# Patient Record
Sex: Male | Born: 1940 | Race: Black or African American | Hispanic: No | Marital: Married | Smoking: Former smoker
Health system: Southern US, Community
[De-identification: ages and names within clinical notes are randomized; demographics above are authoritative.]

## PROBLEM LIST (undated history)

## (undated) DIAGNOSIS — M1990 Unspecified osteoarthritis, unspecified site: Secondary | ICD-10-CM

## (undated) DIAGNOSIS — F411 Generalized anxiety disorder: Secondary | ICD-10-CM

## (undated) DIAGNOSIS — Z87448 Personal history of other diseases of urinary system: Secondary | ICD-10-CM

## (undated) DIAGNOSIS — N400 Enlarged prostate without lower urinary tract symptoms: Secondary | ICD-10-CM

## (undated) DIAGNOSIS — N200 Calculus of kidney: Secondary | ICD-10-CM

## (undated) DIAGNOSIS — F329 Major depressive disorder, single episode, unspecified: Secondary | ICD-10-CM

## (undated) DIAGNOSIS — K5730 Diverticulosis of large intestine without perforation or abscess without bleeding: Secondary | ICD-10-CM

## (undated) DIAGNOSIS — Z860100 Personal history of colon polyps, unspecified: Secondary | ICD-10-CM

## (undated) DIAGNOSIS — J342 Deviated nasal septum: Secondary | ICD-10-CM

## (undated) DIAGNOSIS — F321 Major depressive disorder, single episode, moderate: Secondary | ICD-10-CM

## (undated) DIAGNOSIS — IMO0001 Reserved for inherently not codable concepts without codable children: Secondary | ICD-10-CM

## (undated) DIAGNOSIS — D126 Benign neoplasm of colon, unspecified: Secondary | ICD-10-CM

## (undated) DIAGNOSIS — H6120 Impacted cerumen, unspecified ear: Secondary | ICD-10-CM

## (undated) DIAGNOSIS — J019 Acute sinusitis, unspecified: Secondary | ICD-10-CM

## (undated) DIAGNOSIS — I10 Essential (primary) hypertension: Secondary | ICD-10-CM

## (undated) DIAGNOSIS — F419 Anxiety disorder, unspecified: Secondary | ICD-10-CM

## (undated) DIAGNOSIS — M542 Cervicalgia: Secondary | ICD-10-CM

## (undated) DIAGNOSIS — D649 Anemia, unspecified: Secondary | ICD-10-CM

## (undated) DIAGNOSIS — E669 Obesity, unspecified: Secondary | ICD-10-CM

## (undated) DIAGNOSIS — N419 Inflammatory disease of prostate, unspecified: Secondary | ICD-10-CM

## (undated) DIAGNOSIS — E7800 Pure hypercholesterolemia, unspecified: Secondary | ICD-10-CM

## (undated) HISTORY — DX: Anxiety disorder, unspecified: F41.9

## (undated) HISTORY — DX: Diverticulosis of large intestine without perforation or abscess without bleeding: K57.30

## (undated) HISTORY — DX: Personal history of colon polyps, unspecified: Z86.0100

## (undated) HISTORY — DX: Pure hypercholesterolemia, unspecified: E78.00

## (undated) HISTORY — DX: Major depressive disorder, single episode, moderate: F32.1

## (undated) HISTORY — DX: Benign neoplasm of colon, unspecified: D12.6

## (undated) HISTORY — DX: Obesity, unspecified: E66.9

## (undated) HISTORY — DX: Generalized anxiety disorder: F41.1

## (undated) HISTORY — PX: OTHER SURGICAL HISTORY: SHX169

## (undated) HISTORY — DX: Unspecified osteoarthritis, unspecified site: M19.90

## (undated) HISTORY — DX: Benign prostatic hyperplasia without lower urinary tract symptoms: N40.0

## (undated) HISTORY — DX: Major depressive disorder, single episode, unspecified: F32.9

## (undated) HISTORY — DX: Cervicalgia: M54.2

## (undated) HISTORY — DX: Personal history of colonic polyps: Z86.010

## (undated) HISTORY — PX: COLONOSCOPY: SHX174

## (undated) HISTORY — DX: Essential (primary) hypertension: I10

## (undated) HISTORY — DX: Inflammatory disease of prostate, unspecified: N41.9

## (undated) HISTORY — DX: Anemia, unspecified: D64.9

## (undated) HISTORY — DX: Impacted cerumen, unspecified ear: H61.20

## (undated) HISTORY — DX: Personal history of other diseases of urinary system: Z87.448

## (undated) HISTORY — DX: Acute sinusitis, unspecified: J01.90

## (undated) HISTORY — DX: Reserved for inherently not codable concepts without codable children: IMO0001

## (undated) HISTORY — DX: Calculus of kidney: N20.0

---

## 2001-07-25 ENCOUNTER — Emergency Department (HOSPITAL_COMMUNITY): Admission: EM | Admit: 2001-07-25 | Discharge: 2001-07-25 | Payer: Self-pay

## 2001-07-27 ENCOUNTER — Encounter: Payer: Self-pay | Admitting: Urology

## 2001-07-27 ENCOUNTER — Ambulatory Visit (HOSPITAL_COMMUNITY): Admission: RE | Admit: 2001-07-27 | Discharge: 2001-07-27 | Payer: Self-pay | Admitting: Urology

## 2003-03-15 ENCOUNTER — Ambulatory Visit (HOSPITAL_COMMUNITY): Admission: RE | Admit: 2003-03-15 | Discharge: 2003-03-15 | Payer: Self-pay | Admitting: Urology

## 2005-03-07 ENCOUNTER — Ambulatory Visit: Payer: Self-pay | Admitting: Pulmonary Disease

## 2006-03-19 ENCOUNTER — Ambulatory Visit: Payer: Self-pay | Admitting: Internal Medicine

## 2006-03-26 ENCOUNTER — Ambulatory Visit: Payer: Self-pay | Admitting: Internal Medicine

## 2006-04-25 ENCOUNTER — Ambulatory Visit (HOSPITAL_COMMUNITY): Admission: RE | Admit: 2006-04-25 | Discharge: 2006-04-25 | Payer: Self-pay | Admitting: Urology

## 2006-06-26 ENCOUNTER — Ambulatory Visit: Payer: Self-pay | Admitting: Pulmonary Disease

## 2007-04-22 DIAGNOSIS — N200 Calculus of kidney: Secondary | ICD-10-CM | POA: Insufficient documentation

## 2007-04-22 DIAGNOSIS — I10 Essential (primary) hypertension: Secondary | ICD-10-CM | POA: Insufficient documentation

## 2007-04-22 DIAGNOSIS — Z87448 Personal history of other diseases of urinary system: Secondary | ICD-10-CM

## 2007-04-22 DIAGNOSIS — M1990 Unspecified osteoarthritis, unspecified site: Secondary | ICD-10-CM | POA: Insufficient documentation

## 2007-04-22 HISTORY — DX: Personal history of other diseases of urinary system: Z87.448

## 2007-04-22 HISTORY — DX: Unspecified osteoarthritis, unspecified site: M19.90

## 2007-04-22 HISTORY — DX: Calculus of kidney: N20.0

## 2007-04-24 ENCOUNTER — Ambulatory Visit: Payer: Self-pay | Admitting: Pulmonary Disease

## 2007-04-24 DIAGNOSIS — K5730 Diverticulosis of large intestine without perforation or abscess without bleeding: Secondary | ICD-10-CM

## 2007-04-24 DIAGNOSIS — E7800 Pure hypercholesterolemia, unspecified: Secondary | ICD-10-CM | POA: Insufficient documentation

## 2007-04-24 DIAGNOSIS — D126 Benign neoplasm of colon, unspecified: Secondary | ICD-10-CM

## 2007-04-24 HISTORY — DX: Benign neoplasm of colon, unspecified: D12.6

## 2007-04-24 HISTORY — DX: Diverticulosis of large intestine without perforation or abscess without bleeding: K57.30

## 2007-04-25 LAB — CONVERTED CEMR LAB
Albumin: 3.9 g/dL (ref 3.5–5.2)
Alkaline Phosphatase: 56 units/L (ref 39–117)
BUN: 11 mg/dL (ref 6–23)
Basophils Absolute: 0 10*3/uL (ref 0.0–0.1)
Basophils Relative: 0 % (ref 0.0–1.0)
Bilirubin Urine: NEGATIVE
Cholesterol: 218 mg/dL (ref 0–200)
Crystals: NEGATIVE
Direct LDL: 141.7 mg/dL
Eosinophils Absolute: 0 10*3/uL (ref 0.0–0.6)
GFR calc Af Amer: 86 mL/min
HDL: 45 mg/dL (ref >39.0)
Hgb A1c MFr Bld: 4.8 % (ref 4.6–6.0)
Ketones, ur: NEGATIVE mg/dL
Leukocytes, UA: NEGATIVE
Lymphocytes Relative: 16.1 % (ref 12.0–46.0)
MCHC: 34.5 g/dL (ref 30.0–36.0)
Monocytes Relative: 7.4 % (ref 3.0–11.0)
Neutro Abs: 5.5 10*3/uL (ref 1.4–7.7)
Nitrite: NEGATIVE
PSA: 0.48 ng/mL (ref 0.10–4.00)
Platelets: 185 10*3/uL (ref 150–400)
Potassium: 4.7 meq/L (ref 3.5–5.1)
Sodium: 142 meq/L (ref 135–145)
Specific Gravity, Urine: 1.02 (ref 1.000–1.03)
Total Protein: 6.7 g/dL (ref 6.0–8.3)
Urine Glucose: NEGATIVE mg/dL
Urobilinogen, UA: 0.2 (ref 0.0–1.0)
VLDL: 18 mg/dL (ref 0–40)
WBC, UA: NONE SEEN cells/hpf
pH: 7 (ref 5.0–8.0)

## 2007-07-29 ENCOUNTER — Ambulatory Visit: Payer: Self-pay | Admitting: Pulmonary Disease

## 2007-07-29 DIAGNOSIS — IMO0001 Reserved for inherently not codable concepts without codable children: Secondary | ICD-10-CM

## 2007-07-29 HISTORY — DX: Reserved for inherently not codable concepts without codable children: IMO0001

## 2007-08-03 LAB — CONVERTED CEMR LAB
AST: 24 units/L (ref 0–37)
Albumin: 4.1 g/dL (ref 3.5–5.2)
Alkaline Phosphatase: 45 units/L (ref 39–117)
Bilirubin, Direct: 0.3 mg/dL (ref 0.0–0.3)
Cholesterol: 147 mg/dL (ref 0–200)
LDL Cholesterol: 89 mg/dL (ref 0–99)
Total CHOL/HDL Ratio: 3.3
Triglycerides: 64 mg/dL (ref 0–149)

## 2007-08-25 ENCOUNTER — Telehealth (INDEPENDENT_AMBULATORY_CARE_PROVIDER_SITE_OTHER): Payer: Self-pay | Admitting: *Deleted

## 2007-10-28 ENCOUNTER — Ambulatory Visit: Payer: Self-pay | Admitting: Pulmonary Disease

## 2007-11-01 LAB — CONVERTED CEMR LAB
Bilirubin, Direct: 0.2 mg/dL (ref 0.0–0.3)
CO2: 29 meq/L (ref 19–32)
Calcium: 9.3 mg/dL (ref 8.4–10.5)
Creatinine, Ser: 1.1 mg/dL (ref 0.4–1.5)
GFR calc Af Amer: 86 mL/min
GFR calc non Af Amer: 71 mL/min
HDL: 44.7 mg/dL (ref >39.0)
LDL Cholesterol: 112 mg/dL — ABNORMAL HIGH (ref 0–99)
Sodium: 141 meq/L (ref 135–145)
Total Bilirubin: 1.1 mg/dL (ref 0.3–1.2)
Total CHOL/HDL Ratio: 3.7
Triglycerides: 51 mg/dL (ref 0–149)

## 2007-11-21 HISTORY — PX: OTHER SURGICAL HISTORY: SHX169

## 2007-12-15 ENCOUNTER — Ambulatory Visit: Payer: Self-pay | Admitting: Orthopedic Surgery

## 2007-12-15 ENCOUNTER — Emergency Department (HOSPITAL_COMMUNITY): Admission: EM | Admit: 2007-12-15 | Discharge: 2007-12-15 | Payer: Self-pay | Admitting: Emergency Medicine

## 2007-12-16 ENCOUNTER — Encounter: Payer: Self-pay | Admitting: Orthopedic Surgery

## 2007-12-18 ENCOUNTER — Ambulatory Visit (HOSPITAL_COMMUNITY): Admission: RE | Admit: 2007-12-18 | Discharge: 2007-12-18 | Payer: Self-pay | Admitting: Orthopedic Surgery

## 2007-12-18 ENCOUNTER — Telehealth: Payer: Self-pay | Admitting: Orthopedic Surgery

## 2007-12-18 ENCOUNTER — Ambulatory Visit: Payer: Self-pay | Admitting: Orthopedic Surgery

## 2007-12-22 ENCOUNTER — Ambulatory Visit: Payer: Self-pay | Admitting: Orthopedic Surgery

## 2007-12-31 ENCOUNTER — Ambulatory Visit: Payer: Self-pay | Admitting: Orthopedic Surgery

## 2008-01-18 ENCOUNTER — Ambulatory Visit: Payer: Self-pay | Admitting: Orthopedic Surgery

## 2008-02-17 ENCOUNTER — Ambulatory Visit: Payer: Self-pay | Admitting: Orthopedic Surgery

## 2008-03-21 ENCOUNTER — Ambulatory Visit: Payer: Self-pay | Admitting: Orthopedic Surgery

## 2008-04-27 ENCOUNTER — Ambulatory Visit: Payer: Self-pay | Admitting: Orthopedic Surgery

## 2008-04-29 ENCOUNTER — Ambulatory Visit: Payer: Self-pay | Admitting: Pulmonary Disease

## 2008-04-30 LAB — CONVERTED CEMR LAB
Basophils Absolute: 0 10*3/uL (ref 0.0–0.1)
Bilirubin Urine: NEGATIVE
Calcium: 9.7 mg/dL (ref 8.4–10.5)
Cholesterol: 140 mg/dL (ref 0–200)
Creatinine, Ser: 1 mg/dL (ref 0.4–1.5)
GFR calc non Af Amer: 79 mL/min
HDL: 53.9 mg/dL (ref >39.0)
Hemoglobin: 13.8 g/dL (ref 13.0–17.0)
LDL Cholesterol: 78 mg/dL (ref 0–99)
Leukocytes, UA: NEGATIVE
Lymphocytes Relative: 15.7 % (ref 12.0–46.0)
MCHC: 35.5 g/dL (ref 30.0–36.0)
Monocytes Relative: 6.1 % (ref 3.0–12.0)
Neutro Abs: 4.8 10*3/uL (ref 1.4–7.7)
Neutrophils Relative %: 76.8 % (ref 43.0–77.0)
Nitrite: NEGATIVE
PSA: 0.45 ng/mL (ref 0.10–4.00)
Platelets: 167 10*3/uL (ref 150–400)
RDW: 12 % (ref 11.5–14.6)
Sodium: 143 meq/L (ref 135–145)
TSH: 1.43 microintl units/mL (ref 0.35–5.50)
Total Bilirubin: 1.1 mg/dL (ref 0.3–1.2)
Total CHOL/HDL Ratio: 2.6
Triglycerides: 40 mg/dL (ref 0–149)
VLDL: 8 mg/dL (ref 0–40)
WBC, UA: NONE SEEN cells/hpf
pH: 6 (ref 5.0–8.0)

## 2008-05-02 ENCOUNTER — Encounter: Payer: Self-pay | Admitting: Orthopedic Surgery

## 2008-05-02 ENCOUNTER — Encounter (HOSPITAL_COMMUNITY): Admission: RE | Admit: 2008-05-02 | Discharge: 2008-06-01 | Payer: Self-pay | Admitting: Orthopedic Surgery

## 2008-05-30 ENCOUNTER — Encounter: Payer: Self-pay | Admitting: Orthopedic Surgery

## 2008-06-02 ENCOUNTER — Encounter (HOSPITAL_COMMUNITY): Admission: RE | Admit: 2008-06-02 | Discharge: 2008-06-27 | Payer: Self-pay | Admitting: Orthopedic Surgery

## 2008-06-27 ENCOUNTER — Encounter: Payer: Self-pay | Admitting: Orthopedic Surgery

## 2008-06-29 ENCOUNTER — Ambulatory Visit: Payer: Self-pay | Admitting: Orthopedic Surgery

## 2009-04-28 ENCOUNTER — Ambulatory Visit: Payer: Self-pay | Admitting: Pulmonary Disease

## 2009-04-30 LAB — CONVERTED CEMR LAB
Albumin: 3.9 g/dL (ref 3.5–5.2)
Alkaline Phosphatase: 53 units/L (ref 39–117)
BUN: 14 mg/dL (ref 6–23)
Basophils Absolute: 0.1 10*3/uL (ref 0.0–0.1)
CO2: 28 meq/L (ref 19–32)
Calcium: 9.4 mg/dL (ref 8.4–10.5)
Cholesterol: 113 mg/dL (ref 0–200)
Creatinine, Ser: 1.2 mg/dL (ref 0.4–1.5)
Eosinophils Absolute: 0.1 10*3/uL (ref 0.0–0.7)
Glucose, Bld: 98 mg/dL (ref 70–99)
HDL: 48.7 mg/dL (ref >39.00)
Hemoglobin: 13.5 g/dL (ref 13.0–17.0)
Leukocytes, UA: NEGATIVE
Lymphocytes Relative: 16.6 % (ref 12.0–46.0)
MCHC: 33.4 g/dL (ref 30.0–36.0)
Neutro Abs: 3.9 10*3/uL (ref 1.4–7.7)
Neutrophils Relative %: 71.3 % (ref 43.0–77.0)
Nitrite: NEGATIVE
RDW: 11.8 % (ref 11.5–14.6)
Specific Gravity, Urine: 1.025 (ref 1.000–1.030)
Triglycerides: 47 mg/dL (ref 0.0–149.0)
Urine Glucose: NEGATIVE mg/dL
Urobilinogen, UA: 0.2 (ref 0.0–1.0)

## 2010-04-27 ENCOUNTER — Ambulatory Visit
Admission: RE | Admit: 2010-04-27 | Discharge: 2010-04-27 | Payer: Self-pay | Source: Home / Self Care | Attending: Pulmonary Disease | Admitting: Pulmonary Disease

## 2010-04-27 ENCOUNTER — Other Ambulatory Visit: Payer: Self-pay | Admitting: Pulmonary Disease

## 2010-04-27 DIAGNOSIS — M542 Cervicalgia: Secondary | ICD-10-CM | POA: Insufficient documentation

## 2010-04-27 HISTORY — DX: Cervicalgia: M54.2

## 2010-04-27 LAB — BASIC METABOLIC PANEL
BUN: 16 mg/dL (ref 6–23)
CO2: 29 mEq/L (ref 19–32)
Calcium: 9.7 mg/dL (ref 8.4–10.5)
Chloride: 105 mEq/L (ref 96–112)
Creatinine, Ser: 1.1 mg/dL (ref 0.4–1.5)
GFR: 82.74 mL/min (ref >60.00)
Glucose, Bld: 92 mg/dL (ref 70–99)
Potassium: 4.4 mEq/L (ref 3.5–5.1)
Sodium: 142 mEq/L (ref 135–145)

## 2010-04-27 LAB — URINALYSIS, ROUTINE W REFLEX MICROSCOPIC
Bilirubin Urine: NEGATIVE
Hemoglobin, Urine: NEGATIVE
Ketones, ur: NEGATIVE
Leukocytes, UA: NEGATIVE
Nitrite: NEGATIVE
Specific Gravity, Urine: 1.015 (ref 1.000–1.030)
Total Protein, Urine: NEGATIVE
Urine Glucose: NEGATIVE
Urobilinogen, UA: 0.2 (ref 0.0–1.0)
pH: 6 (ref 5.0–8.0)

## 2010-04-27 LAB — LIPID PANEL
Cholesterol: 127 mg/dL (ref 0–200)
HDL: 44.9 mg/dL (ref >39.00)
LDL Cholesterol: 72 mg/dL (ref 0–99)
Total CHOL/HDL Ratio: 3
Triglycerides: 52 mg/dL (ref 0.0–149.0)
VLDL: 10.4 mg/dL (ref 0.0–40.0)

## 2010-04-27 LAB — CBC WITH DIFFERENTIAL/PLATELET
Basophils Absolute: 0.1 10*3/uL (ref 0.0–0.1)
Basophils Relative: 0.6 % (ref 0.0–3.0)
Eosinophils Absolute: 0 10*3/uL (ref 0.0–0.7)
Eosinophils Relative: 0.5 % (ref 0.0–5.0)
HCT: 43.6 % (ref 39.0–52.0)
Hemoglobin: 14.7 g/dL (ref 13.0–17.0)
Lymphocytes Relative: 11.1 % — ABNORMAL LOW (ref 12.0–46.0)
Lymphs Abs: 1 10*3/uL (ref 0.7–4.0)
MCHC: 33.7 g/dL (ref 30.0–36.0)
MCV: 89.8 fl (ref 78.0–100.0)
Monocytes Absolute: 0.7 10*3/uL (ref 0.1–1.0)
Monocytes Relative: 7.2 % (ref 3.0–12.0)
Neutro Abs: 7.4 10*3/uL (ref 1.4–7.7)
Neutrophils Relative %: 80.6 % — ABNORMAL HIGH (ref 43.0–77.0)
Platelets: 194 10*3/uL (ref 150.0–400.0)
RBC: 4.85 Mil/uL (ref 4.22–5.81)
RDW: 12.9 % (ref 11.5–14.6)
WBC: 9.2 10*3/uL (ref 4.5–10.5)

## 2010-04-27 LAB — TSH: TSH: 1.9 u[IU]/mL (ref 0.35–5.50)

## 2010-04-27 LAB — HEPATIC FUNCTION PANEL
ALT: 26 U/L (ref 0–53)
AST: 22 U/L (ref 0–37)
Albumin: 4.1 g/dL (ref 3.5–5.2)
Alkaline Phosphatase: 61 U/L (ref 39–117)
Bilirubin, Direct: 0.2 mg/dL (ref 0.0–0.3)
Total Bilirubin: 1.3 mg/dL — ABNORMAL HIGH (ref 0.3–1.2)
Total Protein: 6.9 g/dL (ref 6.0–8.3)

## 2010-04-27 LAB — PSA: PSA: 0.49 ng/mL (ref 0.10–4.00)

## 2010-05-24 NOTE — Assessment & Plan Note (Signed)
Summary: 12 months/apc   CC:  yearly follow up--fasting today--needs refills of meds today for #90 day supply.  History of Present Illness: 70 y/o BM here for a follow up visit... he has multiple medical problems as noted below...    ~  seen Jan09 for CPX w/ abn FLP- started on Simvastatin 20mg /d w/ f/u FLP improved but intol w/ myalgias... therefore switched to Lipitor 20mg /d + CoQ10 & tol well...   ~  April 29, 2008:  he had a Colles fx left wrist 8/09 falling from a ladder... Rx by [facility] in [place]... had surg w/ pins, now in therapy... feeling well otherwise w/o new complaints or concerns...   ~  April 28, 2009:  he's had a good yr- no new complaints or concerns... BP controlled on med;  Chol looks good on Lip20;  refuses Flu shots...   ~  April 27, 2010:  Yearly ROV- c/o neck pain & occipital discomfort (XRay shows cerv DDD & arthritis)... otherw feeling well- BP controlled on meds;  Chol looks good on Lip20;  GI stable & up to date;  HU stable & he likes the [employer];  OK Flu shot today...   Current Problems:   1.  HYPERTENSION - good control w/ DIOVAN/Hct 160-12.5 daily, & takes ASA 81mg /d...  BP today= 144/84, & he is not really checking BP's at home and I have rec'd a digital home BP cuff... denies HA, fatigue, visual changes, CP, palipit, dizziness, syncope, dyspnea, edema, etc... we reviewed low sodium diet & wt reduction strategies...  2.  HYPERCHOLESTEROLEMIA & OVERWEIGHT - on LIPITOR 20mg /d.  ~  FLP  11/06 showed TChol 198, TG 64, HDL 43, LDL 142... he preferred diet Rx.  ~  FLP 1/09  (wt=236#) showed TChol218, TG 88, HDL 45, LDL 142... start Simvastatin 20mg /d...  ~  FLP 4/09 (wt=214#) on Simva20= TChol 147, TG 64, HDL 45, LDL 89... intol w/ myalgias, switch to Lipitor20.  ~  FLP 7/09 on Lip20 showed TChol 167, TG 51, HDL 45, LDL 112... rec- same meds for now.  ~  FLP 1/11 (wt=225#) showed TChol 113, TG 47, HDL 49, LDL 55  ~  FLP 1/12 (wt=232#) showed TChol  127, TG 52, HDL 45, LDL 72  3.  DIVERTICULOSIS & COLONIC POLYPS - he denies N/V, C/D or change in bowel habits, abd pain, gas etc... last colonoscopy was 12/07 by DrPerry & showed divertics & tiny 1mm polyp, f/u rec 5 yrs.  4.  RENAL CALCULUS & Hx of PROSTATITIS - denies any prob w/ urination etc...   ~  labs 1/09 showed PSA= 0.48  ~  labs 1/10 showed PSA= 0.45  ~  labs 1/11 showed PSA= 0.44  ~  labs 1/12 showed PSA= 0.49  5.  DEGENERATIVE JOINT DISEASE - he fell off a roof 03/23/07 and hit his R hip area... he went to the [facility] in [place] and had XRays and was told they showed some arthritis, but no fractures... given ETODOLAC for Prn use...  ~  8/09 fell off ladder w/ Colles fx left wrist- surg w/ pins per DrHarrison...  6.  HEALTH MAINTENANCE - Pneumovax 1/09... Tetanus 1/09... yearly Flu shot in Oct... colon up to date...   Preventive Screening-Counseling & Management  Alcohol-Tobacco     Smoking Status: current  Comments: smokes 1-2 cigars per week  Allergies (verified): No Known Drug Allergies  Comments:  Nurse/Medical Assistant: The patient's medications and allergies were reviewed with the  patient and were updated in the Medication and Allergy Lists.  Past History:  Past Medical History: =HYPERTENSION (ICD-401.9) HYPERCHOLESTEROLEMIA (ICD-272.0) OBESITY (ICD-278.00) DIVERTICULOSIS OF COLON (ICD-562.10) COLONIC POLYPS (ICD-211.3) RENAL CALCULUS (ICD-592.0) PROSTATITIS, HX OF (ICD-V13.09) DEGENERATIVE JOINT DISEASE (ICD-715.90)  Past Surgical History: S/P colles fracture left wrist after fall 8/09  Family History: Reviewed history from 10/28/2007 and no changes required. Father died age 29 with pancreatic cancer Mother died age 40 from a cerebral aneurysm 4 Sibs- 1 Bro w/ obesity & HBP; 3 Sis without problems  Social History: Reviewed history from 04/28/2009 and no changes required. 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    Vital Signs:  Patient profile:   70 year old male Height:      72 inches Weight:      232.13 pounds BMI:     31.60 O2 Sat:      96 % on Room air Temp:     98.7 degrees F oral Pulse rate:   50 / minute BP sitting:   144 / 84  (right arm) Cuff size:   large  Vitals Entered By: Randell Loop CMA (April 27, 2010 10:02 AM)  O2 Sat at Rest %:  96 O2 Flow:  Room air CC: yearly follow up--fasting today--needs refills of meds today for #90 day supply Is Patient Diabetic? No Pain Assessment Patient in pain? yes      Onset of pain  neck-back of head pain Comments meds updated today with pt   Physical Exam  Additional Exam:  WD, WN, 70 y/o BM in NAD... GENERAL:  Alert & oriented; pleasant & cooperative... HEENT:  [place]/AT, EOM-wnl, PERRLA, EACs-clear, TMs-wnl, NOSE-clear, THROAT-clear & wnl. NECK:  Supple w/ fairROM; no JVD; normal carotid impulses w/o bruits; no thyromegaly or nodules palpated; no lymphadenopathy. CHEST:  Clear to P & A; without wheezes/ rales/ or rhonchi. HEART:  Regular Rhythm; without murmurs/ rubs/ or gallops. ABDOMEN:  Soft & nontender; normal bowel sounds; no organomegaly or masses detected. RECTAL:  neg- 3+ smooth, stool heme neg... EXT:  s/p left wrist surg, mild arthritic changes; no varicose veins/ venous insuffic/ or edema. NEURO:  CN's intact; motor testing normal; sensory  testing normal; gait normal & balance OK. DERM:  No lesions noted; no rash etc...    X-ray Musculoskeletal  Procedure date:  04/27/2010  Findings:      CERVICAL SPINE - COMPLETE 4+ VIEW Comparison: None.   Findings: Straightening and mild reversal of cervical lordosis. Normal prevertebral soft tissues. Cervicothoracic junction alignment is within normal limits.  Cervical disc space narrowing from C5-C6 inferiorly.  Associated mild endplate osteophytes. Bilateral posterior element alignment is within normal limits.  No significant osseous neural foraminal stenosis.  AP alignment and lung apices within normal limits.  Calcified atherosclerosis of the aortic arch and also the right carotid bifurcation in the neck.  C1- C2 alignment and odontoid process within normal limits.   IMPRESSION: Lower cervical disc degeneration.  Calcified atherosclerosis.  Read By:  Augusto Gamble,  M.D.    MISC. Report  Procedure date:  04/27/2010  Findings:      BMP (METABOL)   Sodium                    142 mEq/L                   135-145   Potassium                 4.4 mEq/L                   3.5-5.1   Chloride                  105 mEq/L                   96-112   Carbon Dioxide            29 mEq/L                    19-32   Glucose                   92 mg/dL                    04-54   BUN                       16 mg/dL                    0-98   Creatinine                1.1 mg/dL                   1.1-9.1   Calcium                   9.7 mg/dL                   4.7-82.9   GFR                       82.74 mL/min                >60.00  Hepatic/Liver Function Panel (HEPATIC)   Total Bilirubin      [H]  1.3 mg/dL                   5.6-2.1   Direct Bilirubin          0.2 mg/dL                   3.0-8.6   Alkaline Phosphatase      61 U/L                      39-117   AST                       22 U/L                      0-37   ALT                       26 U/L  0-53   Total  Protein             6.9 g/dL                    1.6-1.0   Albumin                   4.1 g/dL                    9.6-0.4  CBC Platelet w/Diff (CBCD)   White Cell Count          9.2 K/uL                    4.5-10.5   Red Cell Count            4.85 Mil/uL                 4.22-5.81   Hemoglobin                14.7 g/dL                   54.0-98.1   Hematocrit                43.6 %                      39.0-52.0   MCV                       89.8 fl                     78.0-100.0   Platelet Count            194.0 K/uL                  150.0-400.0   Neutrophil %         [H]  80.6 %                      43.0-77.0   Lymphocyte %         [L]  11.1 %                      12.0-46.0   Monocyte %                7.2 %                       3.0-12.0   Eosinophils%              0.5 %                       0.0-5.0   Basophils %               0.6 %                       0.0-3.0  Comments:      Lipid Panel (LIPID)   Cholesterol               127 mg/dL                   1-914   Triglycerides             52.0 mg/dL  0.0-149.0   HDL                       16.10 mg/dL                 >96.04   LDL Cholesterol           72 mg/dL                    5-40   TSH (TSH)   FastTSH                   1.90 uIU/mL                 0.35-5.50   Prostate Specific Antigen (PSA)   PSA-Hyb                   0.49 ng/mL                  0.10-4.00  UDip w/Micro (URINE)   Color                     LT. YELLOW   Clarity                   CLEAR                       Clear   Specific Gravity          1.015                       1.000 - 1.030   Urine Ph                  6.0                         5.0-8.0   Protein                   NEGATIVE                    Negative   Urine Glucose             NEGATIVE                    Negative   Ketones                   NEGATIVE                    Negative   Urine Bilirubin           NEGATIVE                    Negative   Blood                     NEGATIVE                     Negative   Urobilinogen              0.2                         0.0 - 1.0   Leukocyte Esterace        NEGATIVE  Negative   Nitrite                   NEGATIVE                    Negative   Urine Mucus               Presence of                 None   Impression & Recommendations:  Problem # 1:  NECK PAIN (ICD-723.1) He has DDD & Cx arthritis> rec rest, heat Tramadol & refer to Ortho vs NS if symptoms worsen... His updated medication list for this problem includes:    Adult Aspirin Low Strength 81 Mg Tbdp (Aspirin) .Marland Kitchen... 1 tab daily    Tramadol Hcl 50 Mg Tabs (Tramadol hcl) .Marland Kitchen... Take 1 tab by mouth every 6 h as needed for pain...  Orders: T-Cervical Spine Comp 4 Views (72050TC)  Problem # 2:  HYPERTENSION (ICD-401.9) Controlled>  same meds. His updated medication list for this problem includes:    Diovan Hct 160-12.5 Mg Tabs (Valsartan-hydrochlorothiazide) .Marland Kitchen... Take 1 tablet by mouth once a day  Orders: TLB-BMP (Basic Metabolic Panel-BMET) (80048-METABOL) TLB-Hepatic/Liver Function Pnl (80076-HEPATIC) TLB-CBC Platelet - w/Differential (85025-CBCD) TLB-Lipid Panel (80061-LIPID) TLB-TSH (Thyroid Stimulating Hormone) (84443-TSH) TLB-PSA (Prostate Specific Antigen) (84153-PSA) TLB-Udip w/ Micro (81001-URINE)  Problem # 3:  HYPERCHOLESTEROLEMIA (ICD-272.0) Stable on Lip20>  continue same. His updated medication list for this problem includes:    Lipitor 20 Mg Tabs (Atorvastatin calcium) .Marland Kitchen... 1 by mouth at bedtime  Problem # 4:  OBESITY (ICD-278.00) We discussed weight reduction...  Problem # 5:  COLONIC POLYPS (ICD-211.3) GI is stable & up[ to date...  Problem # 6:  PROSTATITIS, HX OF (ICD-V13.09) GU is stable as well, w/o acute symptoms, etc...  Problem # 7:  OTHER MEDICAL PROBLEMS AS NOTED>>>  Complete Medication List: 1)  Adult Aspirin Low Strength 81 Mg Tbdp (Aspirin) .Marland Kitchen.. 1 tab daily 2)  Diovan Hct 160-12.5 Mg Tabs (Valsartan-hydrochlorothiazide)  .... Take 1 tablet by mouth once a day 3)  Lipitor 20 Mg Tabs (Atorvastatin calcium) .Marland Kitchen.. 1 by mouth at bedtime 4)  Multi-vitamin/minerals Tabs (Multiple vitamins-minerals) .Marland Kitchen.. 1 tab daily 5)  Saw Palmetto 500 Mg Caps (Saw palmetto (serenoa repens)) .... Take 1 tablet by mouth once a day 6)  Tramadol Hcl 50 Mg Tabs (Tramadol hcl) .... Take 1 tab by mouth every 6 h as needed for pain...  Other Orders: Influenza Vaccine MCR (16109)  Patient Instructions: 1)  Today we updated your med list- see below.... 2)  We refilled your meds for 2012... 3)  Today we did some neck XRays & your follow up FASTING blood work... please call the "phone tree" in a few days for your results.Marland KitchenMarland Kitchen 4)  We gave you the 2011 Flu vaccine as well... 5)  Let's get on track w/ our diet & exercise program (try to lose 10-15 lbs)... 6)  Call for any problems.Marland KitchenMarland Kitchen 7)  Please schedule a follow-up appointment in 1 year, sooner as needed. Prescriptions: TRAMADOL HCL 50 MG TABS (TRAMADOL HCL) take 1 tab by mouth every 6 H as needed for pain...  #100 x prn   Entered and Authorized by:   Michele Mcalpine MD   Signed by:   Michele Mcalpine MD on 04/27/2010   Method used:   Print then Give to Patient   RxID:   [id number] LIPITOR 20 MG  TABS (  ATORVASTATIN CALCIUM) 1 by mouth at bedtime  #90 x 4   Entered and Authorized by:   Michele Mcalpine MD   Signed by:   Michele Mcalpine MD on 04/27/2010   Method used:   Print then Give to Patient   RxID:   [id number] DIOVAN HCT 160-12.5 MG  TABS (VALSARTAN-HYDROCHLOROTHIAZIDE) Take 1 tablet by mouth once a day  #90 x 4   Entered and Authorized by:   Michele Mcalpine MD   Signed by:   Michele Mcalpine MD on 04/27/2010   Method used:   Print then Give to Patient   RxID:   [id number]    Immunizations Administered:  Influenza Vaccine # 1:    Vaccine Type: Fluvax MCR    Site: left deltoid    Mfr: GlaxoSmithKline    Dose: 0.5 ml    Route: IM    Given by: Randell Loop CMA    Exp. Date:  10/20/2010    Lot #: [id number]    VIS given: 11/14/09 version given April 27, 2010.  Flu Vaccine Consent Questions:    Do you have a history of severe allergic reactions to this vaccine? no    Any prior history of allergic reactions to egg and/or gelatin? no    Do you have a sensitivity to the preservative Thimersol? no    Do you have a past history of Guillan-Barre Syndrome? no    Do you currently have an acute febrile illness? no    Have you ever had a severe reaction to latex? no    Vaccine information given and explained to patient? yes

## 2010-05-24 NOTE — Assessment & Plan Note (Signed)
Summary: 12 months/apc   CC:  Yearly ROV & review of mult medical problems....  History of Present Illness: 70 y/o BM here for a follow up visit... he has multiple medical problems as noted below...    ~  seen Jan09 for CPX w/ abn FLP- started on Simvastatin 20mg /d w/ f/u FLP improved but intol w/ myalgias... therefore switched to Lipitor 20mg /d + CoQ10 & tol well...   ~  April 29, 2008:  he had a Colles fx left wrist 8/09 falling from a ladder... Rx by [facility] in [place]... had surg w/ pins, now in therapy... feeling well otherwise w/o new complaints or concerns...   ~  April 28, 2009:  he's had a good yr- no new complaints or concerns... BP controlled on med;  Chol looks good on Lip20;  refuses Flu shots...   Current Problems:   1.  HYPERTENSION - good control w/ DIOVAN/Hct 160-12.5 daily, & takes ASA 81mg /d...  BP today= 150/90, & he is not really checking BP's at home and I have rec'd a digital home BP cuff... denies HA, fatigue, visual changes, CP, palipit, dizziness, syncope, dyspnea, edema, etc... we reviewed low sodium diet & wt reduction strategies...  2.  HYPERCHOLESTEROLEMIA & OVERWEIGHT -   ~  FLP  11/06 showed TChol 198, TG 64, HDL 43, LDL 142... he preferred diet Rx.  ~  FLP 1/09  (wt=236#) showed TChol218, TG 88, HDL 45, LDL 142... start Simvastatin 20mg /d...  ~  FLP 4/09 (wt=214#) on Simva20= TChol 147, TG 64, HDL 45, LDL 89... intol w/ myalgias, switch to Lipitor20.  ~  FLP 7/09 on Lip20 showed TChol 167, TG 51, HDL 45, LDL 112... rec- same meds for now.  ~  FLP 1/11 (wt=225#) showed TChol   3.  DIVERTICULOSIS & COLONIC POLYPS - he denies N/V, C/D or change in bowel habits, abd pain, gas etc... last colonoscopy was 12/07 by DrPerry & showed divertics & tiny 1mm polyp, f/u rec 5 yrs.  4.  RENAL CALCULUS & Hx of PROSTATITIS - denies any prob w/ urination etc...   ~  labs 1/09 showed PSA= 0.48  ~  labs 1/10 showed PSA= 0.45  ~  labs 1/11 showed PSA=   5.   DEGENERATIVE JOINT DISEASE - he fell off a roof 03/23/07 and hit his R hip area... he went to the [facility] in [place] and had XRays and was told they showed some arthritis, but no fractures... given ETODOLAC for Prn use...  ~  8/09 fell off ladder w/ Colles fx left wrist- surg w/ pins per DrHarrison...  6.  HEALTH MAINTENANCE - Pneumovax 1/09... Tetanus 1/09... yearly Flu shot in Oct... colon up to date...    Allergies (verified): No Known Drug Allergies  Comments:  Nurse/Medical Assistant: The patient's medications and allergies were reviewed with the patient and were updated in the Medication and Allergy Lists.  Past History:  Past Medical History:  HYPERTENSION (ICD-401.9) HYPERCHOLESTEROLEMIA (ICD-272.0) OBESITY (ICD-278.00) DIVERTICULOSIS OF COLON (ICD-562.10) COLONIC POLYPS (ICD-211.3) RENAL CALCULUS (ICD-592.0) PROSTATITIS, HX OF (ICD-V13.09) DEGENERATIVE JOINT DISEASE (ICD-715.90)  Past Surgical History: S/P colles fracture left wrist after fall 8/09  Family History: Reviewed history from 10/28/2007 and no changes required. Father died age 30 with pancreatic cancer Mother died age 70 from a cerebral aneurysm 4 Sibs- 1 Bro w/ obesity & HBP; 3 Sis without problems  Social History: Reviewed history from 10/28/2007 and no changes required. 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 Smokes cigars only - 1-2 per week No Etoh  Review of Systems  The patient denies fever, chills, sweats, anorexia, fatigue, weakness, malaise, weight loss, sleep disorder, blurring, diplopia, eye irritation, eye discharge, vision loss, eye pain, photophobia, earache, ear discharge, tinnitus, decreased hearing, nasal congestion, nosebleeds, sore throat, hoarseness, chest pain, palpitations, syncope, dyspnea on exertion, orthopnea, PND, peripheral edema, cough, dyspnea at rest, excessive sputum, hemoptysis, wheezing, pleurisy,  nausea, vomiting, diarrhea, constipation, change in bowel habits, abdominal pain, melena, hematochezia, jaundice, gas/bloating, indigestion/heartburn, dysphagia, odynophagia, dysuria, hematuria, urinary frequency, urinary hesitancy, nocturia, incontinence, back pain, joint pain, joint swelling, muscle cramps, muscle weakness, stiffness, arthritis, sciatica, restless legs, leg pain at night, leg pain with exertion, rash, itching, dryness, suspicious lesions, paralysis, paresthesias, seizures, tremors, vertigo, transient blindness, frequent falls, frequent headaches, difficulty walking, depression, anxiety, memory loss, confusion, cold intolerance, heat intolerance, polydipsia, polyphagia, polyuria, unusual weight change, abnormal bruising, bleeding, enlarged lymph nodes, urticaria, allergic rash, hay fever, and recurrent infections.    Vital Signs:  Patient profile:   70 year old male Height:      72 inches Weight:      225.25 pounds BMI:     30.66 O2 Sat:      96 % on Room air Temp:     97.4 degrees F oral Pulse rate:   57 / minute BP sitting:   150 / 90  (left arm) Cuff size:   regular  Vitals Entered By: Randell Loop CMA (April 28, 2009 9:48 AM)  O2 Sat at Rest %:  96 O2 Flow:  Room air CC: Yearly ROV & review of mult medical problems... Comments MEDS UPDATED TODAY   Physical Exam  Additional Exam:  WD, WN, 70 y/o BM in NAD... GENERAL:  Alert & oriented; pleasant & cooperative... HEENT:  [place]/AT, EOM-wnl, PERRLA, EACs-clear, TMs-wnl, NOSE-clear, THROAT-clear & wnl. NECK:  Supple w/ fairROM; no JVD; normal carotid impulses w/o bruits; no thyromegaly or nodules palpated; no lymphadenopathy. CHEST:  Clear to P & A; without wheezes/ rales/ or rhonchi. HEART:  Regular Rhythm; without murmurs/ rubs/ or gallops. ABDOMEN:  Soft & nontender; normal bowel sounds; no organomegaly or masses detected. EXT:  s/p left wrist surg, mild arthritic changes; no varicose veins/ venous insuffic/ or  edema. NEURO:  CN's intact; motor testing normal; sensory testing normal; gait normal & balance OK. DERM:  No lesions noted; no rash etc...     CXR  Procedure date:  04/28/2009  Findings:      CHEST - 2 VIEW   Comparison: Chest 03/07/2005.   Findings: Lungs are clear.  Heart size is normal.  No pleural effusion or focal bony abnormality.   IMPRESSION: No acute disease.   Read By:  Charyl Dancer,  M.D.   MISC. Report  Procedure date:  04/28/2009  Findings:        Cholesterol               113 mg/dL                   9-604   Triglycerides             47.0 mg/dL                  5.4-098.1   HDL                       19.14 mg/dL                 >  39.00   VLDL Cholesterol          9.4 mg/dL                   2.1-30.8   LDL Cholesterol           55 mg/dL                    6-57     Sodium                    143 mEq/L                   135-145   Potassium                 4.7 mEq/L                   3.5-5.1   Chloride                  111 mEq/L                   96-112   Carbon Dioxide            28 mEq/L                    19-32   Glucose                   98 mg/dL                    84-69   BUN                       14 mg/dL                    6-29   Creatinine                1.2 mg/dL                   5.2-8.4   Calcium                   9.4 mg/dL                   1.3-24.4   GFR                       77.42 mL/min                >60     White Cell Count          5.5 K/uL                    4.5-10.5   Red Cell Count            4.39 Mil/uL                 4.22-5.81   Hemoglobin                13.5 g/dL                   01.0-27.2   Hematocrit                40.2 %  39.0-52.0   MCV                       91.7 fl                     78.0-100.0   Platelet Count            153.0 K/uL   Platelet Count            153.0 K/uL                  150.0-400.0   Neutrophil %              71.3 %                      43.0-77.0   Lymphocyte %              16.6  %                      12.0-46.0   Monocyte %                9.1 %                       3.0-12.0   Eosinophils%              2.0 %                       0.0-5.0   Basophils %               1.0 %  Comments:        Total Bilirubin      [H]  1.3 mg/dL                   7.8-4.6   Direct Bilirubin          0.2 mg/dL                   9.6-2.9   Alkaline Phosphatase      53 U/L                      39-117   AST                       25 U/L                      0-37   ALT                       26 U/L                      0-53   Total Protein             6.6 g/dL                    5.2-8.4   Albumin                   3.9 g/dL                    1.3-2.4     FastTSH                   1.46 uIU/mL  0.35-5.50     Color                     YELLOW   Clarity                   CLEAR                       Clear   Specific Gravity          1.025                       1.000 - 1.030   Urine Ph                  5.5                         5.0-8.0   Protein                   NEGATIVE                    Negative   Urine Glucose             NEGATIVE                    Negative   Ketones                   TRACE                       Negative   Urine Bilirubin           NEGATIVE                    Negative   Blood                     NEGATIVE                    Negative   Urobilinogen              0.2                         0.0 - 1.0   Leukocyte Esterace        NEGATIVE                    Negative   Nitrite                   NEGATIVE                    Negative   Urine WBC                 0-2/hpf                     0-2/hpf   Urine Epith               Rare(0-4/hpf)               Rare(0-4/hpf)   Urine Bacteria            Rare(<10/hpf)               None  Prostate Specific Antigen (PSA)   PSA-Hyb  0.44 ng/mL                  0.10-4.00   Impression & Recommendations:  Problem # 1:  HYPERTENSION (ICD-401.9) Controlled-  same meds. His updated medication list for this  problem includes:    Diovan Hct 160-12.5 Mg Tabs (Valsartan-hydrochlorothiazide) .Marland Kitchen... Take 1 tablet by mouth once a day  Orders: T-2 View CXR (71020TC) Venipuncture (04540) TLB-Lipid Panel (80061-LIPID) TLB-BMP (Basic Metabolic Panel-BMET) (80048-METABOL) TLB-CBC Platelet - w/Differential (85025-CBCD) TLB-Hepatic/Liver Function Pnl (80076-HEPATIC) TLB-TSH (Thyroid Stimulating Hormone) (84443-TSH) TLB-Udip w/ Micro (81001-URINE) TLB-PSA (Prostate Specific Antigen) (84153-PSA)  Problem # 2:  HYPERCHOLESTEROLEMIA (ICD-272.0) Doing satis on the Lip20... His updated medication list for this problem includes:    Lipitor 20 Mg Tabs (Atorvastatin calcium) .Marland Kitchen... 1 by mouth at bedtime  Problem # 3:  OBESITY (ICD-278.00) Overweight-  discussed diet + exercise...  Problem # 4:  COLONIC POLYPS (ICD-211.3) GI stable and up to date...  Problem # 5:  DEGENERATIVE JOINT DISEASE (ICD-715.90) Stable w/ OTC meds Prn... His updated medication list for this problem includes:    Adult Aspirin Low Strength 81 Mg Tbdp (Aspirin) .Marland Kitchen... 1 tab daily  Problem # 6:  OTHER MEDICAL PROBLEMS AS NOTED>>>  Complete Medication List: 1)  Adult Aspirin Low Strength 81 Mg Tbdp (Aspirin) .Marland Kitchen.. 1 tab daily 2)  Diovan Hct 160-12.5 Mg Tabs (Valsartan-hydrochlorothiazide) .... Take 1 tablet by mouth once a day 3)  Lipitor 20 Mg Tabs (Atorvastatin calcium) .Marland Kitchen.. 1 by mouth at bedtime 4)  Multi-vitamin/minerals Tabs (Multiple vitamins-minerals) .Marland Kitchen.. 1 tab daily  Other Orders: Prescription Created Electronically [phone])  Patient Instructions: 1)  Today we updated your med list- see below.... 2)  Today we did your follow up CXR & FASTING blood work... please call the "phone tree" in a few days for your lab results.Marland KitchenMarland Kitchen 3)  Call for any problems.Marland KitchenMarland Kitchen 4)  Please schedule a follow-up appointment in 1 year, soonerprn. Prescriptions: LIPITOR 20 MG  TABS (ATORVASTATIN CALCIUM) 1 by mouth at bedtime  #30 x prn   Entered and  Authorized by:   Michele Mcalpine MD   Signed by:   Michele Mcalpine MD on 04/28/2009   Method used:   Print then Give to Patient   RxID:   [id number] DIOVAN HCT 160-12.5 MG  TABS (VALSARTAN-HYDROCHLOROTHIAZIDE) Take 1 tablet by mouth once a day  #30 x prn   Entered and Authorized by:   Michele Mcalpine MD   Signed by:   Michele Mcalpine MD on 04/28/2009   Method used:   Print then Give to Patient   RxID:   [id number]

## 2010-07-31 ENCOUNTER — Other Ambulatory Visit: Payer: Self-pay | Admitting: Pulmonary Disease

## 2010-08-01 ENCOUNTER — Other Ambulatory Visit: Payer: Self-pay | Admitting: *Deleted

## 2010-08-01 MED ORDER — ATORVASTATIN CALCIUM 20 MG PO TABS: 20.0000 mg | ORAL_TABLET | Freq: Every day | ORAL | Status: DC

## 2010-09-04 NOTE — Op Note (Signed)
NAMECRISTIAN, Samuel Mendez NO.:  [id number]   MEDICAL RECORD NO.:  [id number]          PATIENT TYPE:  AMB   LOCATION:  DAY                           FACILITY:  [facility]   PHYSICIAN:  Vickki Hearing, M.D.DATE OF BIRTH:  1941/02/10   DATE OF PROCEDURE:  12/18/2007  DATE OF DISCHARGE:                               OPERATIVE REPORT   This is a 70 year old male who fell off a 6 feet ladder on August 25.  He had a closed reduction in the emergency room.  The reduction improved  the fracture alignment, but it was unstable.  The patient was advised to  have surgery.  He understood his risks and benefits and informed consent  was taken at that time and he presented for surgery.   PREOPERATIVE DIAGNOSIS:  Closed fracture, left distal radius.   POSTOPERATIVE DIAGNOSIS:  Closed fracture, left distal radius.   PROCEDURE:  Closed reduction, percutaneous pinning.   SURGEON:  Vickki Hearing, M.D.   ASSISTANT:  None.   ANESTHETIC:  General.   FINDINGS:  Angulated fracture of the left distal radius.  It was a  closed injury.   SPECIMENS:  None.   ESTIMATED BLOOD LOSS:  None.   COMPLICATIONS:  None.   TOURNIQUET:  None used.   INSTRUMENT COUNT:  Correct.   The patient went to PACU in good condition.   PROCEDURE DONE AS FOLLOWS:  In the preop holding area the patient's site  marking counter-signed and the history and physical were updated and the  antibiotics were started, Ancef 1 gram.  He was taken to surgery, had  general anesthetic.  Time out procedure was completed.  He had closed  reduction of the fracture under C-arm guidance and then pinning with  three 0.062 K-wires.  Two from distal to proximal and one from proximal  to distal.  The fracture alignment and angulation were improved.  The  pins were cut and left outside the skin.  The caps were placed.  The  volar splint was applied.  Radiographs looked excellent.   The patient will have a cast applied in  about 3 days on his follow up  visit.  He has been given a follow up, I believe for Tuesday.      Vickki Hearing, M.D.  Electronically Signed     SEH/MEDQ  D:  12/18/2007  T:  12/18/2007  Job:  [id number]

## 2010-09-04 NOTE — Consult Note (Signed)
NAMEDALONTE, HARDAGE NO.:  [id number]   MEDICAL RECORD NO.:  [id number]          PATIENT TYPE:  EMS   LOCATION:  ED                            FACILITY:  [facility]   PHYSICIAN:  Vickki Hearing, M.D.DATE OF BIRTH:  19-Dec-1940   DATE OF CONSULTATION:  12/15/2007  DATE OF DISCHARGE:  12/15/2007                                 CONSULTATION   CHIEF COMPLAINT:  Left wrist pain.   HISTORY:  A 70 year old male fell off a 6-foot ladder.  He is right-hand  dominant.  He was injured on the 25th and presented to the emergency  room with a deformed wrist with some complaints of paresthesias in his  left hand.  He was given some pain medicine.  He denied any other pain  complaints.  He only had mild pain in his left wrist that was  nonradiating and not associated with any other finding.   PAST MEDICAL HISTORY:  He has a history of hypertension.   SOCIAL HISTORY:  Nonsmoker, nondrinker, and no drug abuse.   ALLERGIES:  No allergies.   MEDICATIONS:  He takes Diovan once a day, dose unknown.   REVIEW OF SYSTEMS:  Review of systems x10 negative except for  musculoskeletal.  VITAL SIGNS:  Stable.  CONSTITUTIONAL:  He is awake,  alert, and oriented x3.  Mood and affect are normal.  HEENT:  Normal.  NECK:  Full range of motion.  No soft tissue swelling or tenderness.   PHYSICAL EXAMINATION:  CARDIOVASCULAR:  Respiratory and chest were  normal.  ABDOMEN:  Soft and nontender.   All extremities were normal except for his left wrist was deformed and  tender at the fracture site with some decreased sensation in the left  long, ring, and small fingers.   Skin was intact.  He had no lymphadenopathy.   Radiographs show a dorsal angulation of the distal fragment apex volar  significant, perhaps even some comminution may extend into the joint  surface.   We did a close reduction under local hematoma block, placed a long-arm  sugar-tong splint.   He is to be discharged on with  some Vicodin.  Surgery date is scheduled  for December 18, 2007, and his preoperative date is December 17, 2007 at 9  a.m.   I told him his options were close reduction cast with likely  displacement and then further surgery or closed reduction with  percutaneous pinning now as an outpatient and he opted for the latter.  Informed consent was obtained in the emergency room.      Vickki Hearing, M.D.  Electronically Signed     SEH/MEDQ  D:  12/15/2007  T:  12/16/2007  Job:  [id number]   cc:   [facility]

## 2011-01-29 ENCOUNTER — Ambulatory Visit (INDEPENDENT_AMBULATORY_CARE_PROVIDER_SITE_OTHER): Payer: Medicare Other | Admitting: Adult Health

## 2011-01-29 ENCOUNTER — Encounter: Payer: Self-pay | Admitting: Adult Health

## 2011-01-29 VITALS — BP 142/84 | HR 60 | Temp 97.0°F | Ht 72.0 in | Wt 227.4 lb

## 2011-01-29 DIAGNOSIS — J069 Acute upper respiratory infection, unspecified: Secondary | ICD-10-CM

## 2011-01-29 MED ORDER — AZITHROMYCIN 250 MG PO TABS: ORAL_TABLET | ORAL | Status: AC

## 2011-01-29 NOTE — Patient Instructions (Signed)
Mucinex DM Twice daily  As needed  Cough/congestion  Fluids and rest  Alternate Motrin and Tylenol As needed  Body aches and fever  Zyrtec 10 mg At bedtime  As needed  drainage Zpack to have on hold if symptoms worsen with discolored mucus.  Please contact office for sooner follow up if symptoms do not improve or worsen or seek emergency care  follow up Dr. Kriste Basque  As planned

## 2011-01-29 NOTE — Progress Notes (Signed)
Subjective:    Patient ID: Samuel Mendez, male    DOB: 11-21-1940, 70 y.o.   MRN: [id number]  HPI 70 y/o AAM  he has multiple medical problems as noted below...   ~ seen Jan09 for CPX w/ abn FLP- started on Simvastatin 20mg /d w/ f/u FLP improved but intol w/ myalgias... therefore switched to Lipitor 20mg /d + CoQ10 & tol well...   ~ April 29, 2008: he had a Colles fx left wrist 8/09 falling from a ladder... Rx by [facility] in [place]... had surg w/ pins, now in therapy... feeling well otherwise w/o new complaints or concerns...   ~ April 28, 2009: he's had a good yr- no new complaints or concerns... BP controlled on med; Chol looks good on Lip20; refuses Flu shots...   ~ April 27, 2010: Yearly ROV- c/o neck pain & occipital discomfort (XRay shows cerv DDD & arthritis)... otherw feeling well- BP controlled on meds; Chol looks good on Lip20; GI stable & up to date; HU stable & he likes the [employer]; OK Flu shot today...   ~01/29/2011 Acute OV  Complains of chills, head and chest congestion with dry cough, headache and wheezing x 4 days.  OTC cold meds not helping. NO chest pain,discolored mucus or fever.  Tickle in throat . Feels tired and run down. No energy.  Wife has had similar cold symptoms.     PMH  1. HYPERTENSION - good control w/ DIOVAN/Hct 160-12.5 daily, & takes ASA 81mg /d...  2. HYPERCHOLESTEROLEMIA & OVERWEIGHT - on LIPITOR 20mg /d.  ~ FLP 11/06 showed TChol 198, TG 64, HDL 43, LDL 142... he preferred diet Rx.  ~ FLP 1/09 (wt=236#) showed TChol218, TG 88, HDL 45, LDL 142... start Simvastatin 20mg /d...  ~ FLP 4/09 (wt=214#) on Simva20= TChol 147, TG 64, HDL 45, LDL 89... intol w/ myalgias, switch to Lipitor20.  ~ FLP 7/09 on Lip20 showed TChol 167, TG 51, HDL 45, LDL 112... rec- same meds for now.  ~ FLP 1/11 (wt=225#) showed TChol 113, TG 47, HDL 49, LDL 55  ~ FLP 1/12 (wt=232#) showed TChol 127, TG 52, HDL 45, LDL 72  3. DIVERTICULOSIS & COLONIC POLYPS - he  denies N/V, C/D or change in bowel habits, abd pain, gas etc... last colonoscopy was 12/07 by DrPerry & showed divertics & tiny 1mm polyp, f/u rec 5 yrs.  4. RENAL CALCULUS & Hx of PROSTATITIS -   ~ labs 1/09 showed PSA= 0.48  ~ labs 1/10 showed PSA= 0.45  ~ labs 1/11 showed PSA= 0.44  ~ labs 1/12 showed PSA= 0.49  5. DEGENERATIVE JOINT DISEASE - he fell off a roof 03/23/07 and hit his R hip area... he went to the [facility] in [place] and had XRays and was told they showed some arthritis, but no fractures... given ETODOLAC for Prn use...  ~ 8/09 fell off ladder w/ Colles fx left wrist- surg w/ pins per DrHarrison...  6. HEALTH MAINTENANCE - Pneumovax 1/09... Tetanus 1/09... yearly Flu shot in Oct... colon up to date...     Review of Systems Constitutional:   No  weight loss, night sweats,  Fevers, chills, fatigue, or  lassitude.  HEENT:   No headaches,  Difficulty swallowing,  Tooth/dental problems, or  Sore throat,                No sneezing, itching, ear ache, nasal congestion, post nasal drip,   CV:  No chest pain,  Orthopnea, PND, swelling in lower extremities, anasarca, dizziness, palpitations,  syncope.   GI  No heartburn, indigestion, abdominal pain, nausea, vomiting, diarrhea, change in bowel habits, loss of appetite, bloody stools.   Resp   No coughing up of blood.   No chest wall deformity  Skin: no rash or lesions.  GU: no dysuria, change in color of urine, no urgency or frequency.  No flank pain, no hematuria   MS:  No joint pain or swelling.  No decreased range of motion.  No back pain.  Psych:  No change in mood or affect. No depression or anxiety.  No memory loss.         Objective:   Physical Exam GEN: A/Ox3; pleasant , NAD, well nourished  HEENT:  [place]/AT,  EACs-clear, TMs-wnl, NOSE-clear drainage, THROAT-clear, no lesions, no postnasal drip or exudate noted.   NECK:  Supple w/ fair ROM; no JVD; normal carotid impulses w/o bruits; no thyromegaly or nodules  palpated; no lymphadenopathy.  RESP  Coarse BS  w/o, wheezes/ rales/ or rhonchi.no accessory muscle use, no dullness to percussion  CARD:  RRR, no m/r/g  , no peripheral edema, pulses intact, no cyanosis or clubbing.  GI:   Soft & nt; nml bowel sounds; no organomegaly or masses detected.  Musco: Warm bil, no deformities or joint swelling noted.   Neuro: alert, no focal deficits noted.    Skin: Warm, no lesions or rashes        Assessment & Plan:

## 2011-01-29 NOTE — Assessment & Plan Note (Signed)
URI -suspect viral in nature   Plan;  Mucinex DM Twice daily  As needed  Cough/congestion  Fluids and rest  Alternate Motrin and Tylenol As needed  Body aches and fever  Zyrtec 10 mg At bedtime  As needed  drainage Zpack to have on hold if symptoms worsen with discolored mucus.  Please contact office for sooner follow up if symptoms do not improve or worsen or seek emergency care  follow up Dr. Kriste Basque  As planned

## 2011-03-08 ENCOUNTER — Ambulatory Visit (INDEPENDENT_AMBULATORY_CARE_PROVIDER_SITE_OTHER): Payer: Medicare Other

## 2011-03-08 ENCOUNTER — Telehealth: Payer: Self-pay | Admitting: Pulmonary Disease

## 2011-03-08 DIAGNOSIS — Z23 Encounter for immunization: Secondary | ICD-10-CM

## 2011-03-08 MED ORDER — VALSARTAN-HYDROCHLOROTHIAZIDE 160-12.5 MG PO TABS: 1.0000 | ORAL_TABLET | Freq: Every day | ORAL | Status: DC

## 2011-03-08 MED ORDER — ZOSTER VACCINE LIVE 19400 UNT/0.65ML ~~LOC~~ SOLR: 0.6500 mL | Freq: Once | SUBCUTANEOUS | Status: DC

## 2011-03-08 MED ORDER — ZOSTER VACCINE LIVE 19400 UNT/0.65ML ~~LOC~~ SOLR: 0.6500 mL | Freq: Once | SUBCUTANEOUS | Status: AC

## 2011-03-08 NOTE — Telephone Encounter (Signed)
rx have been printed and given to pt waiting in the lobby.

## 2011-03-30 ENCOUNTER — Encounter: Payer: Self-pay | Admitting: Internal Medicine

## 2011-04-01 ENCOUNTER — Encounter: Payer: Self-pay | Admitting: Internal Medicine

## 2011-04-10 ENCOUNTER — Encounter: Payer: Self-pay | Admitting: Internal Medicine

## 2011-04-26 ENCOUNTER — Ambulatory Visit (AMBULATORY_SURGERY_CENTER): Payer: Medicare Other | Admitting: *Deleted

## 2011-04-26 ENCOUNTER — Encounter: Payer: Self-pay | Admitting: Internal Medicine

## 2011-04-26 VITALS — Ht 72.0 in | Wt 227.0 lb

## 2011-04-26 DIAGNOSIS — Z1211 Encounter for screening for malignant neoplasm of colon: Secondary | ICD-10-CM

## 2011-04-26 DIAGNOSIS — Z86010 Personal history of colonic polyps: Secondary | ICD-10-CM

## 2011-04-26 MED ORDER — PEG-KCL-NACL-NASULF-NA ASC-C 100 G PO SOLR: ORAL | Status: DC

## 2011-04-28 ENCOUNTER — Other Ambulatory Visit: Payer: Self-pay | Admitting: Pulmonary Disease

## 2011-05-01 ENCOUNTER — Other Ambulatory Visit (INDEPENDENT_AMBULATORY_CARE_PROVIDER_SITE_OTHER): Payer: Medicare Other

## 2011-05-01 ENCOUNTER — Encounter: Payer: Self-pay | Admitting: Pulmonary Disease

## 2011-05-01 ENCOUNTER — Ambulatory Visit (INDEPENDENT_AMBULATORY_CARE_PROVIDER_SITE_OTHER): Payer: Medicare Other | Admitting: Pulmonary Disease

## 2011-05-01 DIAGNOSIS — E663 Overweight: Secondary | ICD-10-CM

## 2011-05-01 DIAGNOSIS — D126 Benign neoplasm of colon, unspecified: Secondary | ICD-10-CM

## 2011-05-01 DIAGNOSIS — M1990 Unspecified osteoarthritis, unspecified site: Secondary | ICD-10-CM

## 2011-05-01 DIAGNOSIS — I10 Essential (primary) hypertension: Secondary | ICD-10-CM

## 2011-05-01 DIAGNOSIS — M542 Cervicalgia: Secondary | ICD-10-CM

## 2011-05-01 DIAGNOSIS — E7800 Pure hypercholesterolemia, unspecified: Secondary | ICD-10-CM

## 2011-05-01 DIAGNOSIS — K5730 Diverticulosis of large intestine without perforation or abscess without bleeding: Secondary | ICD-10-CM

## 2011-05-01 DIAGNOSIS — Z87448 Personal history of other diseases of urinary system: Secondary | ICD-10-CM

## 2011-05-01 DIAGNOSIS — E669 Obesity, unspecified: Secondary | ICD-10-CM | POA: Insufficient documentation

## 2011-05-01 DIAGNOSIS — Z125 Encounter for screening for malignant neoplasm of prostate: Secondary | ICD-10-CM

## 2011-05-01 LAB — BASIC METABOLIC PANEL
Chloride: 106 mEq/L (ref 96–112)
Potassium: 4.9 mEq/L (ref 3.5–5.1)
Sodium: 143 mEq/L (ref 135–145)

## 2011-05-01 LAB — CBC WITH DIFFERENTIAL/PLATELET
Basophils Relative: 0.6 % (ref 0.0–3.0)
Eosinophils Absolute: 0 10*3/uL (ref 0.0–0.7)
Lymphs Abs: 1.4 10*3/uL (ref 0.7–4.0)
MCHC: 33.6 g/dL (ref 30.0–36.0)
MCV: 89.4 fl (ref 78.0–100.0)
Monocytes Absolute: 0.7 10*3/uL (ref 0.1–1.0)
Neutrophils Relative %: 74.9 % (ref 43.0–77.0)
Platelets: 173 10*3/uL (ref 150.0–400.0)

## 2011-05-01 LAB — HEPATIC FUNCTION PANEL
ALT: 19 U/L (ref 0–53)
AST: 18 U/L (ref 0–37)
Bilirubin, Direct: 0.1 mg/dL (ref 0.0–0.3)
Total Bilirubin: 1.1 mg/dL (ref 0.3–1.2)

## 2011-05-01 LAB — TSH: TSH: 2.2 u[IU]/mL (ref 0.35–5.50)

## 2011-05-01 LAB — PSA: PSA: 0.48 ng/mL (ref 0.10–4.00)

## 2011-05-01 LAB — LIPID PANEL
Cholesterol: 145 mg/dL (ref 0–200)
LDL Cholesterol: 78 mg/dL (ref 0–99)
Total CHOL/HDL Ratio: 3

## 2011-05-01 NOTE — Patient Instructions (Signed)
Today we updated your med list in our EPIC system...    Continue your current medications the same...  Today we did your follow up fasting blood work...    Please call the PHONE TREE in a few days for your results...    Dial [id number] & when prompted enter your patient number followed by the # symbol...    Your patient number is:  [phone]  We wrote a prescription for the Shingles vaccine which you can fill at any time at a [facility] or [facility] shot clinic...  Call for any questions...  Let's plan another follow up visit in 1 year's time, sooner if needed for problems.Marland KitchenMarland Kitchen

## 2011-05-07 ENCOUNTER — Ambulatory Visit (AMBULATORY_SURGERY_CENTER): Payer: Medicare Other | Admitting: Internal Medicine

## 2011-05-07 ENCOUNTER — Encounter: Payer: Self-pay | Admitting: Internal Medicine

## 2011-05-07 VITALS — BP 147/76 | HR 50 | Temp 97.5°F | Resp 16 | Ht 72.0 in | Wt 227.0 lb

## 2011-05-07 DIAGNOSIS — Z1211 Encounter for screening for malignant neoplasm of colon: Secondary | ICD-10-CM

## 2011-05-07 DIAGNOSIS — D126 Benign neoplasm of colon, unspecified: Secondary | ICD-10-CM

## 2011-05-07 DIAGNOSIS — Z86010 Personal history of colonic polyps: Secondary | ICD-10-CM

## 2011-05-07 DIAGNOSIS — K5730 Diverticulosis of large intestine without perforation or abscess without bleeding: Secondary | ICD-10-CM

## 2011-05-07 NOTE — Patient Instructions (Signed)
Please read the handouts given to you by your recovery room nurse.    You will need another colonoscopy in 5 yrs.  Your polyp results will be mailed to your home within two weeks.   You may resume your routine medications today.  Please increase the fiber in your diet due to your diverticulosis.  IF you have any questions or concerns, please call [phone].  Thank-you.

## 2011-05-07 NOTE — Op Note (Signed)
[facility] [address] [place], [place]  [address]  COLONOSCOPY PROCEDURE REPORT  PATIENT:  Samuel Mendez, Samuel Mendez  MR#:  [id number] BIRTHDATE:  10-02-1940, 70 yrs. old  GENDER:  male ENDOSCOPIST:  Wilhemina Bonito. Eda Keys, MD REF. BY:  Surveillance Program Recall, PROCEDURE DATE:  05/07/2011 PROCEDURE:  Colonoscopy with snare polypectomy x 3 ASA CLASS:  Class II INDICATIONS:  history of pre-cancerous (adenomatous) colon polyps, surveillance and high-risk screening ; index exam 2004 w/ TA; f/u 2007 MEDICATIONS:   MAC sedation, administered by CRNA, propofol (Diprivan) 300 mg IV  DESCRIPTION OF PROCEDURE:   After the risks benefits and alternatives of the procedure were thoroughly explained, informed consent was obtained.  Digital rectal exam was performed and revealed no abnormalities.   The LB 180AL [id number] endoscope was introduced through the anus and advanced to the cecum, which was identified by both the appendix and ileocecal valve, without limitations.  The quality of the prep was excellent, using MoviPrep.  The instrument was then slowly withdrawn as the colon was fully examined. <<PROCEDUREIMAGES>>  FINDINGS:  Three polyps, all < 1cm, were found in the ascending colon and snared without cautery. Retrieval was successful.Mild diverticulosis was found in the sigmoid colon.Otherwise normal colonoscopy without other polyps, masses, vascular ectasias, or inflammatory changes.   Retroflexed views in the rectum revealed no abnormalities.  The time to cecum = 3:05  minutes. The scope was then withdrawn in 14:11  minutes from the cecum and the procedure completed.  COMPLICATIONS:  None  ENDOSCOPIC IMPRESSION: 1) Three polyps in the ascending colon - removed 2) Mild diverticulosis in the sigmoid colon 3) Otherwise normal colonoscopy  RECOMMENDATIONS: 1) Follow up colonoscopy in 5 years  ______________________________ Wilhemina Bonito. Eda Keys, MD  CC:  Michele Mcalpine, MD;  The  Patient  n. eSIGNED:   Wilhemina Bonito. Eda Keys at 05/07/2011 11:42 AM  Candie Chroman, [id number]

## 2011-05-07 NOTE — Progress Notes (Signed)
Patient did not have preoperative order for IV antibiotic SSI prophylaxis. (G8918)  Patient did not experience any of the following events: a burn prior to discharge; a fall within the facility; wrong site/side/patient/procedure/implant event; or a hospital transfer or hospital admission upon discharge from the facility. (G8907)  

## 2011-05-08 ENCOUNTER — Telehealth: Payer: Self-pay | Admitting: *Deleted

## 2011-05-08 NOTE — Telephone Encounter (Signed)

## 2011-05-13 ENCOUNTER — Encounter: Payer: Self-pay | Admitting: Internal Medicine

## 2011-05-29 ENCOUNTER — Encounter: Payer: Self-pay | Admitting: Pulmonary Disease

## 2011-05-29 NOTE — Progress Notes (Signed)
Subjective:     Patient ID: Samuel Mendez, male   DOB: Jul 22, 1940, 71 y.o.   MRN: [id number]  HPI 71 y/o BM here for a follow up visit... he has multiple medical problems as noted below...   ~  April 29, 2008:  he had a Colles fx left wrist 8/09 falling from a ladder... Rx by [facility] in [place]... had surg w/ pins, now in therapy... feeling well otherwise w/o new complaints or concerns...  ~  April 28, 2009:  he's had a good yr- no new complaints or concerns... BP controlled on med;  Chol looks good on Lip20;  refuses Flu shots...  ~  April 27, 2010:  Yearly ROV- c/o neck pain & occipital discomfort (XRay shows cerv DDD & arthritis)... otherw feeling well- BP controlled on meds;  Chol looks good on Lip20;  GI stable & up to date;  HU stable & he likes the [employer];  OK Flu shot today...  ~  May 01, 2011:  Yearly ROV & he reports doing fine, no complaints or concerns except that he feels his memory is poor & we discussed reading, working puzzles, etc...  He had 2012 Flu vaccine;  See prob list below>>    BP controlled on DiovanHCT, 138/88 today & he denies CP, palpit, dizzy, SOB, edema, etc...    FLP at goal on diet + Lip20 (see below); continue same, get wt down...    He is due for f/u colonoscopy & has iot sched already w/ drPerry next week...   Current Problems:   1.  HYPERTENSION - good control w/ DIOVAN/Hct 160-12.5 daily, & takes ASA 81mg /d...   ~  1/12: BP= 144/84 & denies HA, fatigue, visual changes, CP, palipit, dizziness, syncope, dyspnea, edema, etc; we reviewed low sodium diet & wt reduction strategies... ~  1/13: BP= 138/88 & he remains asymptomatic; reviewed wt reduction strategies w/ pt...  2.  HYPERCHOLESTEROLEMIA & OVERWEIGHT - now on LIPITOR 20mg /d. ~  FLP  11/06 showed TChol 198, TG 64, HDL 43, LDL 142... he preferred diet Rx. ~  FLP 1/09  (wt=236#) showed TChol218, TG 88, HDL 45, LDL 142... start Simvastatin 20mg /d... ~  FLP 4/09 (wt=214#) on  Simva20 showed TChol 147, TG 64, HDL 45, LDL 89... intol w/ myalgias, switch to Lipitor20. ~  FLP 7/09 on Lip20 showed TChol 167, TG 51, HDL 45, LDL 112... rec- same meds for now. ~  FLP 1/11 (wt=225#) on Lip20 showed TChol 113, TG 47, HDL 49, LDL 55 ~  FLP 1/12 (wt=232#) showed TChol 127, TG 52, HDL 45, LDL 72 ~  FLP 1/13 (wt=228#) on Lip20 showed TChol 145, TG 61, HDL 55, LDL 78  3.  DIVERTICULOSIS & COLONIC POLYPS - he denies N/V, C/D or change in bowel habits, abd pain, gas etc... last colonoscopy was 12/07 by DrPerry & showed divertics & tiny 1mm polyp, f/u rec 5 yrs.  4.  RENAL CALCULUS & Hx of PROSTATITIS - denies any prob w/ urination etc...  ~  labs 1/09 showed PSA= 0.48 ~  labs 1/10 showed PSA= 0.45 ~  labs 1/11 showed BUN=14, Creat=1.2, PSA= 0.44 ~  labs 1/12 showed PSA= 0.49 ~  Labs 1/13 showed BUN=18, Creat=1.1, PSA=0.48  5.  DEGENERATIVE JOINT DISEASE - he fell off a roof 03/23/07 and hit his R hip area... he went to the [facility] in [place] and had XRays and was told they showed some arthritis, but no fractures... given ETODOLAC for  Prn use... ~  8/09 fell off ladder w/ Colles fx left wrist- surg w/ pins per DrHarrison... ~  CSpine films 1/12 showed straightening of the Cx lordosis, disc sp narrowing C5-6, mild osteophytes, calcif atherosclerosis of Arch & right carotid bifurcation...  6.  HEALTH MAINTENANCE - Pneumovax 1/09... Tetanus 1/09... yearly Flu shot in Oct... colon up to date...   Past Surgical History  Procedure Date  . Colles fracture left wrist after fall 11/2007    Outpatient Encounter Prescriptions as of 05/01/2011  Medication Sig Dispense Refill  . aspirin 81 MG tablet Take 81 mg by mouth daily.        Marland Kitchen atorvastatin (LIPITOR) 20 MG tablet TAKE 1 TABLET BY MOUTH EVERY NIGHT AT BEDTIME  90 tablet  0  . Multiple Vitamins-Minerals (MULTIVITAMIN & MINERAL PO) Take 1 tablet by mouth daily.        . saw palmetto 500 MG capsule Take 500 mg by mouth daily.        .  valsartan-hydrochlorothiazide (DIOVAN-HCT) 160-12.5 MG per tablet Take 1 tablet by mouth daily.  90 tablet  4  . DISCONTD: peg 3350 powder (MOVIPREP) 100 G SOLR moviprep-take as directed.  1 kit  0  . DISCONTD: traMADol (ULTRAM) 50 MG tablet Take 50 mg by mouth every 6 (six) hours as needed.          No Known Allergies   Current Medications, Allergies, Past Medical History, Past Surgical History, Family History, and Social History were reviewed in [employer] record.   Review of Systems        See HPI - all other systems neg except as noted... The patient denies anorexia, fever, weight loss, weight gain, vision loss, decreased hearing, hoarseness, chest pain, syncope, dyspnea on exertion, peripheral edema, prolonged cough, headaches, hemoptysis, abdominal pain, melena, hematochezia, severe indigestion/heartburn, hematuria, incontinence, muscle weakness, suspicious skin lesions, transient blindness, difficulty walking, depression, unusual weight change, abnormal bleeding, enlarged lymph nodes, and angioedema.     Objective:   Physical Exam    WD, WN, 71 y/o BM in NAD... GENERAL:  Alert & oriented; pleasant & cooperative... HEENT:  [place]/AT, EOM-wnl, PERRLA, EACs-clear, TMs-wnl, NOSE-clear, THROAT-clear & wnl. NECK:  Supple w/ fairROM; no JVD; normal carotid impulses w/o bruits; no thyromegaly or nodules palpated; no lymphadenopathy. CHEST:  Clear to P & A; without wheezes/ rales/ or rhonchi. HEART:  Regular Rhythm; without murmurs/ rubs/ or gallops. ABDOMEN:  Soft & nontender; normal bowel sounds; no organomegaly or masses detected. RECTAL:  neg- 3+ smooth, stool heme neg... EXT:  s/p left wrist surg, mild arthritic changes; no varicose veins/ venous insuffic/ or edema. NEURO:  CN's intact; motor testing normal; sensory testing normal; gait normal & balance OK. DERM:  No lesions noted; no rash etc...  RADIOLOGY DATA:  Reviewed in the EPIC EMR & discussed w/ the  patient...    >>CXR 1/11 showed normal heart size, clear lungs, NAD...    >>CSpine films 1/12 showed straightening of the Cx lordosis, disc sp narrowing C5-6, mild osteophytes, calcif atherosclerosis of Arch & right carotid bifurcation...  LABORATORY DATA:  Reviewed in the EPIC EMR & discussed w/ the patient...    >>LABS 1/13:  FLP-at goal on Lip20;  Chems-wnl;  CBC-wnl;  TSH-wnl;  PSA-wnl   Assessment:     HBP>  Controlled on DiovanHCT + diet, needs to do better w/ wt reduction & we reviewed strategies...  CHOL>  Looks good on Lip20 + diet; continue same.Marland KitchenMarland Kitchen  GI> Divertics, Colon Polyps>  He has f/u colonoscopy sched next wk w/ DrPerry...  GU> Hx KidStones, Prostatitis>  Renal function & PSA are WNL  DJD>  Aware, stable w/ exercise, OTC Rx...  Other medical issues as noted...     Plan:     Patient's Medications  New Prescriptions   No medications on file  Previous Medications   ASPIRIN 81 MG TABLET    Take 81 mg by mouth daily.     ATORVASTATIN (LIPITOR) 20 MG TABLET    TAKE 1 TABLET BY MOUTH EVERY NIGHT AT BEDTIME   MULTIPLE VITAMINS-MINERALS (MULTIVITAMIN & MINERAL PO)    Take 1 tablet by mouth daily.     SAW PALMETTO 500 MG CAPSULE    Take 500 mg by mouth daily.     VALSARTAN-HYDROCHLOROTHIAZIDE (DIOVAN-HCT) 160-12.5 MG PER TABLET    Take 1 tablet by mouth daily.  Modified Medications   No medications on file  Discontinued Medications   PEG 3350 POWDER (MOVIPREP) 100 G SOLR    moviprep-take as directed.   TRAMADOL (ULTRAM) 50 MG TABLET    Take 50 mg by mouth every 6 (six) hours as needed.

## 2011-08-27 ENCOUNTER — Telehealth: Payer: Self-pay | Admitting: Pulmonary Disease

## 2011-08-27 MED ORDER — ATORVASTATIN CALCIUM 20 MG PO TABS: 20.0000 mg | ORAL_TABLET | Freq: Every day | ORAL | Status: DC

## 2011-08-27 NOTE — Telephone Encounter (Signed)
Spoke with pt to verify the msg. Rx for lipitor 20 mg refilled- 30 day supply per pt request. Pt states nothing further needed.

## 2012-03-12 ENCOUNTER — Other Ambulatory Visit: Payer: Self-pay | Admitting: Pulmonary Disease

## 2012-03-24 ENCOUNTER — Ambulatory Visit (INDEPENDENT_AMBULATORY_CARE_PROVIDER_SITE_OTHER): Payer: Medicare Other | Admitting: Adult Health

## 2012-03-24 ENCOUNTER — Encounter: Payer: Self-pay | Admitting: Adult Health

## 2012-03-24 VITALS — BP 134/84 | HR 54 | Temp 97.2°F | Ht 72.0 in | Wt 224.0 lb

## 2012-03-24 DIAGNOSIS — Z23 Encounter for immunization: Secondary | ICD-10-CM

## 2012-03-24 DIAGNOSIS — H6120 Impacted cerumen, unspecified ear: Secondary | ICD-10-CM

## 2012-03-24 DIAGNOSIS — J019 Acute sinusitis, unspecified: Secondary | ICD-10-CM

## 2012-03-24 MED ORDER — AMOXICILLIN-POT CLAVULANATE 875-125 MG PO TABS: 1.0000 | ORAL_TABLET | Freq: Two times a day (BID) | ORAL | Status: AC

## 2012-03-24 NOTE — Progress Notes (Signed)
Subjective:    Patient ID: Samuel Mendez, male    DOB: 06-26-1940, 71 y.o.   MRN: [id number]  HPI  71 y/o AAM  he has multiple medical problems as noted below...   ~03/24/2012 Acute OV  Complains of sinus/head congestion, clear-to-yellow drainage with foul smell, PND x41months Ears are stopped up with decreased hearing.  No fever or chest pain  No edema OTC cold meds not helping.  Sinus pain and pressure . No teeth pain.  Worse symptoms for 2 weeks .     PMH  1. HYPERTENSION - good control w/ DIOVAN/Hct 160-12.5 daily, & takes ASA 81mg /d...  2. HYPERCHOLESTEROLEMIA & OVERWEIGHT - on LIPITOR 20mg /d.  ~ FLP 11/06 showed TChol 198, TG 64, HDL 43, LDL 142... he preferred diet Rx.  ~ FLP 1/09 (wt=236#) showed TChol218, TG 88, HDL 45, LDL 142... start Simvastatin 20mg /d...  ~ FLP 4/09 (wt=214#) on Simva20= TChol 147, TG 64, HDL 45, LDL 89... intol w/ myalgias, switch to Lipitor20.  ~ FLP 7/09 on Lip20 showed TChol 167, TG 51, HDL 45, LDL 112... rec- same meds for now.  ~ FLP 1/11 (wt=225#) showed TChol 113, TG 47, HDL 49, LDL 55  ~ FLP 1/12 (wt=232#) showed TChol 127, TG 52, HDL 45, LDL 72  3. DIVERTICULOSIS & COLONIC POLYPS - he denies N/V, C/D or change in bowel habits, abd pain, gas etc... last colonoscopy was 12/07 by DrPerry & showed divertics & tiny 1mm polyp, f/u rec 5 yrs.  4. RENAL CALCULUS & Hx of PROSTATITIS -   ~ labs 1/09 showed PSA= 0.48  ~ labs 1/10 showed PSA= 0.45  ~ labs 1/11 showed PSA= 0.44  ~ labs 1/12 showed PSA= 0.49  5. DEGENERATIVE JOINT DISEASE - he fell off a roof 03/23/07 and hit his R hip area... he went to the [facility] in [place] and had XRays and was told they showed some arthritis, but no fractures... given ETODOLAC for Prn use...  ~ 8/09 fell off ladder w/ Colles fx left wrist- surg w/ pins per DrHarrison...  6. HEALTH MAINTENANCE - Pneumovax 1/09... Tetanus 1/09... yearly Flu shot in Oct... colon up to date...     Review of Systems  Constitutional:    No  weight loss, night sweats,  Fevers, chills, fatigue, or  lassitude.  HEENT:   No headaches,  Difficulty swallowing,  Tooth/dental problems, or  Sore throat,                No sneezing, itching, ear ache,  +nasal congestion, post nasal drip,   CV:  No chest pain,  Orthopnea, PND, swelling in lower extremities, anasarca, dizziness, palpitations, syncope.   GI  No heartburn, indigestion, abdominal pain, nausea, vomiting, diarrhea, change in bowel habits, loss of appetite, bloody stools.   Resp   No coughing up of blood.   No chest wall deformity  Skin: no rash or lesions.  GU: no dysuria, change in color of urine, no urgency or frequency.  No flank pain, no hematuria   MS:  No joint pain or swelling.  No decreased range of motion.  No back pain.  Psych:  No change in mood or affect. No depression or anxiety.  No memory loss.         Objective:   Physical Exam  GEN: A/Ox3; pleasant , NAD, well nourished  HEENT:  /AT,  EACs-bilateral cerumen impaction TMs-wnl, NOSE-clear drainage, THROAT-clear, no lesions, no postnasal drip or exudate noted.   NECK:  Supple w/ fair ROM; no JVD; normal carotid impulses w/o bruits; no thyromegaly or nodules palpated; no lymphadenopathy.  RESP  Coarse BS  w/o, wheezes/ rales/ or rhonchi.no accessory muscle use, no dullness to percussion  CARD:  RRR, no m/r/g  , no peripheral edema, pulses intact, no cyanosis or clubbing.  GI:   Soft & nt; nml bowel sounds; no organomegaly or masses detected.  Musco: Warm bil, no deformities or joint swelling noted.   Neuro: alert, no focal deficits noted.    Skin: Warm, no lesions or rashes        Assessment & Plan:

## 2012-03-24 NOTE — Patient Instructions (Addendum)
Augmentin 875mg  Twice daily  For 10 days  Mucinex  Twice daily  As needed  Cough/congestion  Saline nasal rinses As needed   Nasonex 2 puffs .Twice daily  Until sample is gone.  Fluids and rest  Please contact office for sooner follow up if symptoms do not improve or worsen or seek emergency care  follow up Dr. Kriste Basque  As planned  Debrox As needed  For ear wax.

## 2012-03-26 DIAGNOSIS — H6120 Impacted cerumen, unspecified ear: Secondary | ICD-10-CM | POA: Insufficient documentation

## 2012-03-26 DIAGNOSIS — J019 Acute sinusitis, unspecified: Secondary | ICD-10-CM | POA: Insufficient documentation

## 2012-03-26 HISTORY — DX: Impacted cerumen, unspecified ear: H61.20

## 2012-03-26 HISTORY — DX: Acute sinusitis, unspecified: J01.90

## 2012-03-26 NOTE — Assessment & Plan Note (Signed)
Ear irrigation w/ wax extraction,  Piece of cotton removed from left EAC without difficulty  Advised to not use q-tips

## 2012-03-26 NOTE — Assessment & Plan Note (Signed)
Exacerbation   Plan  Augmentin 875mg  Twice daily  For 10 days  Mucinex  Twice daily  As needed  Cough/congestion  Saline nasal rinses As needed   Nasonex 2 puffs .Twice daily  Until sample is gone.  Fluids and rest  Please contact office for sooner follow up if symptoms do not improve or worsen or seek emergency care  follow up Dr. Kriste Basque  As planned  Debrox As needed  For ear wax.

## 2012-04-30 ENCOUNTER — Encounter: Payer: Self-pay | Admitting: Pulmonary Disease

## 2012-04-30 ENCOUNTER — Ambulatory Visit (INDEPENDENT_AMBULATORY_CARE_PROVIDER_SITE_OTHER)
Admission: RE | Admit: 2012-04-30 | Discharge: 2012-04-30 | Disposition: A | Discharge status: Home / Self Care | Payer: Medicare Other | Source: Ambulatory Visit | Attending: Pulmonary Disease | Admitting: Pulmonary Disease

## 2012-04-30 ENCOUNTER — Other Ambulatory Visit (INDEPENDENT_AMBULATORY_CARE_PROVIDER_SITE_OTHER): Payer: Medicare Other

## 2012-04-30 ENCOUNTER — Ambulatory Visit (INDEPENDENT_AMBULATORY_CARE_PROVIDER_SITE_OTHER): Payer: Medicare Other | Admitting: Pulmonary Disease

## 2012-04-30 VITALS — BP 128/78 | HR 53 | Temp 97.6°F | Ht 72.0 in | Wt 225.2 lb

## 2012-04-30 DIAGNOSIS — M1990 Unspecified osteoarthritis, unspecified site: Secondary | ICD-10-CM

## 2012-04-30 DIAGNOSIS — Z87448 Personal history of other diseases of urinary system: Secondary | ICD-10-CM

## 2012-04-30 DIAGNOSIS — K5730 Diverticulosis of large intestine without perforation or abscess without bleeding: Secondary | ICD-10-CM

## 2012-04-30 DIAGNOSIS — I10 Essential (primary) hypertension: Secondary | ICD-10-CM

## 2012-04-30 DIAGNOSIS — F341 Dysthymic disorder: Secondary | ICD-10-CM

## 2012-04-30 DIAGNOSIS — F419 Anxiety disorder, unspecified: Secondary | ICD-10-CM

## 2012-04-30 DIAGNOSIS — N200 Calculus of kidney: Secondary | ICD-10-CM

## 2012-04-30 DIAGNOSIS — F32A Depression, unspecified: Secondary | ICD-10-CM

## 2012-04-30 DIAGNOSIS — D126 Benign neoplasm of colon, unspecified: Secondary | ICD-10-CM

## 2012-04-30 DIAGNOSIS — E7800 Pure hypercholesterolemia, unspecified: Secondary | ICD-10-CM

## 2012-04-30 DIAGNOSIS — F329 Major depressive disorder, single episode, unspecified: Secondary | ICD-10-CM

## 2012-04-30 DIAGNOSIS — E663 Overweight: Secondary | ICD-10-CM

## 2012-04-30 HISTORY — DX: Anxiety disorder, unspecified: F41.9

## 2012-04-30 HISTORY — DX: Depression, unspecified: F32.A

## 2012-04-30 LAB — CBC WITH DIFFERENTIAL/PLATELET
Eosinophils Relative: 1.4 % (ref 0.0–5.0)
HCT: 40.5 % (ref 39.0–52.0)
Hemoglobin: 13.6 g/dL (ref 13.0–17.0)
Lymphs Abs: 1 10*3/uL (ref 0.7–4.0)
MCV: 87.8 fl (ref 78.0–100.0)
Monocytes Absolute: 0.7 10*3/uL (ref 0.1–1.0)
Monocytes Relative: 8.5 % (ref 3.0–12.0)
Neutro Abs: 5.9 10*3/uL (ref 1.4–7.7)
WBC: 7.8 10*3/uL (ref 4.5–10.5)

## 2012-04-30 LAB — LIPID PANEL
HDL: 46.4 mg/dL (ref >39.00)
LDL Cholesterol: 61 mg/dL (ref 0–99)
VLDL: 10 mg/dL (ref 0.0–40.0)

## 2012-04-30 LAB — HEPATIC FUNCTION PANEL
Albumin: 3.8 g/dL (ref 3.5–5.2)
Alkaline Phosphatase: 57 U/L (ref 39–117)
Total Bilirubin: 1.1 mg/dL (ref 0.3–1.2)

## 2012-04-30 LAB — BASIC METABOLIC PANEL
BUN: 20 mg/dL (ref 6–23)
Calcium: 9.3 mg/dL (ref 8.4–10.5)
GFR: 73.9 mL/min (ref >60.00)
Glucose, Bld: 102 mg/dL — ABNORMAL HIGH (ref 70–99)
Potassium: 4.3 mEq/L (ref 3.5–5.1)
Sodium: 140 mEq/L (ref 135–145)

## 2012-04-30 MED ORDER — ATORVASTATIN CALCIUM 20 MG PO TABS: 20.0000 mg | ORAL_TABLET | Freq: Every day | ORAL | Status: DC

## 2012-04-30 MED ORDER — VALSARTAN-HYDROCHLOROTHIAZIDE 160-12.5 MG PO TABS: 1.0000 | ORAL_TABLET | Freq: Every day | ORAL | Status: DC

## 2012-04-30 MED ORDER — SERTRALINE HCL 50 MG PO TABS: ORAL_TABLET | ORAL | Status: DC

## 2012-04-30 NOTE — Progress Notes (Signed)
Subjective:     Patient ID: Samuel Mendez, male   DOB: July 08, 1940, 72 y.o.   MRN: [id number]  HPI  72 y/o BM here for a follow up visit... he has multiple medical problems as noted below...   ~  April 27, 2010:  Yearly ROV- c/o neck pain & occipital discomfort (XRay shows cerv DDD & arthritis)... otherw feeling well- BP controlled on meds;  Chol looks good on Lip20;  GI stable & up to date;  HU stable & he likes the [employer];  OK Flu shot today...  ~  May 01, 2011:  Yearly ROV & he reports doing fine, no complaints or concerns except that he feels his memory is poor & we discussed reading, working puzzles, etc...  He had 2012 Flu vaccine;  See prob list below>>    BP controlled on DiovanHCT, 138/88 today & he denies CP, palpit, dizzy, SOB, edema, etc...    FLP at goal on diet + Lip20 (see below); continue same, get wt down...    He is due for f/u colonoscopy & has iot sched already w/ drPerry next week...  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    CHOL> on Lip20; wt is overweight & unchanged (225# BMI=31); FLP shows TChol 117, TG 50, HDL 46, LDL 61    GI- Divertics, Polyps> followed by DrPerry & he is overdue for f/u colon- last 12/07 w/ 1mm polyp removed...    GU- Hx stones, prostatitis> no recurrent kidney stones; renal func is wnl w/ Creat=1.2 and PSA=0.61    DJD> uses OTC meds as needed; he has had several falls from ladder; reminded to stay away from ladders!    Anxiety & Depression> He notes bouts of depression, occas tearful, "I get down" & he would like med; try ZOLOFT 50=>100mg /d... We reviewed prob list, meds, xrays and labs> see below for updates >>  CXR 1/14 showed heart is wnl,  lungs clear, NAD... LABS 1/14:  FLP- ok on Lip20;  Chems- wnl;  CBC- wnl;  TSH=1.98;  PSA=0.61         Problem List:    1.  HYPERTENSION - good control w/ DIOVAN/Hct 160-12.5 daily, & takes ASA 81mg /d...   ~  1/12: BP= 144/84 & denies HA, fatigue, visual changes, CP, palipit, dizziness, syncope, dyspnea, edema, etc; we reviewed low sodium diet & wt reduction strategies... ~  1/13: BP= 138/88 & he remains asymptomatic; reviewed wt reduction strategies w/ pt... ~  1/14: on ASA81, DiovanHCT 160/12.5; BP= 128/78 & feeling well w/o CP, palpit, SOB, dizzy, edema, etc...  2.  HYPERCHOLESTEROLEMIA & OVERWEIGHT - now on LIPITOR 20mg /d. ~  FLP  11/06 showed TChol 198, TG 64, HDL 43, LDL 142... he preferred diet Rx. ~  FLP 1/09  (wt=236#) showed TChol218, TG 88, HDL 45, LDL 142... start Simvastatin 20mg /d... ~  FLP 4/09 (wt=214#) on Simva20 showed TChol 147, TG 64, HDL 45, LDL 89... intol w/ myalgias, switch to Lipitor20. ~  FLP 7/09 on Lip20 showed TChol 167, TG 51, HDL 45, LDL 112... rec- same meds for now. ~  FLP 1/11 (wt=225#) on Lip20 showed TChol 113, TG 47,  HDL 49, LDL 55 ~  FLP 1/12 (wt=232#) showed TChol 127, TG 52, HDL 45, LDL 72 ~  FLP 1/13 (wt=228#) on Lip20 showed TChol 145, TG 61, HDL 55, LDL 78 ~  FLP 1/14 (wt=225#) on Lip20 showed TChol 117, TG 50, HDL 46, LDL 61   3.  DIVERTICULOSIS & COLONIC POLYPS - he denies N/V, C/D or change in bowel habits, abd pain, gas etc... last colonoscopy was 12/07 by DrPerry & showed divertics & tiny 1mm polyp, f/u rec 5 yrs. ~  1/14: he is overdue for f/u colon & asked to call DrPerry to sched...  4.  RENAL CALCULUS & Hx of PROSTATITIS - denies any prob w/ urination etc...  ~  labs 1/09 showed PSA= 0.48 ~  labs 1/10 showed PSA= 0.45 ~  labs 1/11 showed BUN=14, Creat=1.2, PSA= 0.44 ~  labs 1/12 showed PSA= 0.49 ~  Labs 1/13 showed BUN=18, Creat=1.1, PSA=0.48 ~  Labs 1/14 showed BUN=20, Creat=1.2, PSA=0.61  5.  DEGENERATIVE JOINT DISEASE - he fell  off a roof 03/23/07 and hit his R hip area... he went to the [facility] in [place] and had XRays and was told they showed some arthritis, but no fractures... given ETODOLAC for Prn use... ~  8/09 fell off ladder w/ Colles fx left wrist- surg w/ pins per DrHarrison... ~  CSpine films 1/12 showed straightening of the Cx lordosis, disc sp narrowing C5-6, mild osteophytes, calcif atherosclerosis of Arch & right carotid bifurcation...  6.  HEALTH MAINTENANCE - Pneumovax 1/09... Tetanus 1/09... yearly Flu shot in Oct... colon up to date...   Past Surgical History  Procedure Date  . Colles fracture left wrist after fall 11/2007    Outpatient Encounter Prescriptions as of 04/30/2012  Medication Sig Dispense Refill  . aspirin 81 MG tablet Take 81 mg by mouth daily.        Marland Kitchen atorvastatin (LIPITOR) 20 MG tablet Take 1 tablet (20 mg total) by mouth daily.  30 tablet  11  . Cod Liver Oil 1000 MG CAPS Take 2 capsules by mouth daily.      . Multiple Vitamins-Minerals (MULTIVITAMIN & MINERAL PO) Take 1 tablet by mouth daily.        . saw palmetto 500 MG capsule Take 500 mg by mouth daily.        . valsartan-hydrochlorothiazide (DIOVAN-HCT) 160-12.5 MG per tablet TAKE ONE TABLET BY MOUTH DAILY  90 tablet  0    No Known Allergies   Current Medications, Allergies, Past Medical History, Past Surgical History, Family History, and Social History were reviewed in [employer] record.   Review of Systems        See HPI - all other systems neg except as noted... The patient denies anorexia, fever, weight loss, weight gain, vision loss, decreased hearing, hoarseness, chest pain, syncope, dyspnea on exertion, peripheral edema, prolonged cough, headaches, hemoptysis, abdominal pain, melena, hematochezia, severe indigestion/heartburn, hematuria, incontinence, muscle weakness, suspicious skin lesions, transient blindness, difficulty walking, depression, unusual weight change, abnormal bleeding,  enlarged lymph nodes, and angioedema.     Objective:   Physical Exam    WD, WN, 72 y/o BM in NAD... GENERAL:  Alert & oriented; pleasant & cooperative... HEENT:  [place]/AT, EOM-wnl, PERRLA, EACs-clear, TMs-wnl, NOSE-clear, THROAT-clear & wnl. NECK:  Supple w/ fairROM; no JVD; normal carotid impulses w/o bruits; no thyromegaly or nodules palpated; no lymphadenopathy. CHEST:  Clear to P & A; without wheezes/ rales/ or rhonchi. HEART:  Regular Rhythm; without  murmurs/ rubs/ or gallops. ABDOMEN:  Soft & nontender; normal bowel sounds; no organomegaly or masses detected. RECTAL:  neg- 3+ smooth, stool heme neg... EXT:  s/p left wrist surg, mild arthritic changes; no varicose veins/ venous insuffic/ or edema. NEURO:  CN's intact; motor testing normal; sensory testing normal; gait normal & balance OK. DERM:  No lesions noted; no rash etc...  RADIOLOGY DATA:  Reviewed in the EPIC EMR & discussed w/ the patient...  LABORATORY DATA:  Reviewed in the EPIC EMR & discussed w/ the patient...    Assessment:      HBP>  Controlled on DiovanHCT + diet, needs to do better w/ wt reduction & we reviewed strategies...  CHOL>  Looks good on Lip20 + diet; continue same...  GI> Divertics, Colon Polyps>  He never received his f/u colonoscopy & asked to resched at his convenience...  GU> Hx KidStones, Prostatitis>  Renal function & PSA are WNL  DJD>  Aware, stable w/ exercise, OTC Rx...  Other medical issues as noted...     Plan:     Patient's Medications  New Prescriptions   SERTRALINE (ZOLOFT) 50 MG TABLET    Take one tablet by mouth daily x 1 month, then increase to 2 daily thereafter  Previous Medications   ASPIRIN 81 MG TABLET    Take 81 mg by mouth daily.     COD LIVER OIL 1000 MG CAPS    Take 2 capsules by mouth daily.   MULTIPLE VITAMINS-MINERALS (MULTIVITAMIN & MINERAL PO)    Take 1 tablet by mouth daily.     SAW PALMETTO 500 MG CAPSULE    Take 500 mg by mouth daily.    Modified  Medications   Modified Medication Previous Medication   ATORVASTATIN (LIPITOR) 20 MG TABLET atorvastatin (LIPITOR) 20 MG tablet      Take 1 tablet (20 mg total) by mouth daily.    Take 1 tablet (20 mg total) by mouth daily.   VALSARTAN-HYDROCHLOROTHIAZIDE (DIOVAN-HCT) 160-12.5 MG PER TABLET valsartan-hydrochlorothiazide (DIOVAN-HCT) 160-12.5 MG per tablet      Take 1 tablet by mouth daily.    TAKE ONE TABLET BY MOUTH DAILY  Discontinued Medications   No medications on file

## 2012-04-30 NOTE — Patient Instructions (Addendum)
Today we updated your med list in our EPIC system...    Continue your current medications the same...    We refilled your meds per request...  Today we did your follow up CXR & FASTING blood work...    We will contact you w/ the results when avail...  We wrote a new prescription for SERTRALINE 50mg - start w/ one tab daily for 1 month & plan to increase to 2 tabs daily thereafter...  Call for any problems...  Let's continue our yearly check ups but call any time if needed.Marland KitchenMarland Kitchen

## 2012-05-01 NOTE — Progress Notes (Signed)
Quick Note:  Spoke with pt and notified of results per Dr. Nadel. Pt verbalized understanding and denied any questions.  ______ 

## 2012-05-25 ENCOUNTER — Telehealth: Payer: Self-pay | Admitting: Pulmonary Disease

## 2012-05-25 MED ORDER — AMOXICILLIN-POT CLAVULANATE 875-125 MG PO TABS: 1.0000 | ORAL_TABLET | Freq: Two times a day (BID) | ORAL | Status: DC

## 2012-05-25 NOTE — Telephone Encounter (Signed)
Called, spoke with pt.  C/o chest and throat congestion, prod cough with yellow mucus, blowing yellow mucus from nose, wheezing, sore throat, and PND when laying down.  Symptoms started on Thursday.  Felt feverish on Saturday.  Denies chest tightness or increased SOB.  Has increased fluid intake and took alka seltzer cold with some relief.  Requesting rxs.  Dr. Kriste Basque, pls advise.  Thank you.  Last OV with SN - 04/30/12; asked to f/u in 1 yr.  [facility]   [place] - verified with pt

## 2012-05-25 NOTE — Telephone Encounter (Signed)
Called, spoke with pt.  Informed him of below recs per Dr. Kriste Basque.  He verbalized understanding of this, is aware augmentin rx [facility] [place], and was advised to call back if symptoms do not improve or worsen or seek emergency care if needed.  He verbalized understanding and voiced no further questions or concerns at this time.

## 2012-05-25 NOTE — Telephone Encounter (Signed)
Per SN--ok to call in augmentin 875 mg  #14  1 po bid and use mucinex 2 po bid with plenty of fluids.  thanks

## 2012-12-08 ENCOUNTER — Other Ambulatory Visit: Payer: Self-pay | Admitting: Pulmonary Disease

## 2013-02-25 ENCOUNTER — Other Ambulatory Visit: Payer: Self-pay

## 2013-04-14 ENCOUNTER — Other Ambulatory Visit: Payer: Self-pay | Admitting: Pulmonary Disease

## 2013-07-28 ENCOUNTER — Other Ambulatory Visit: Payer: Self-pay | Admitting: Pulmonary Disease

## 2013-07-28 MED ORDER — VALSARTAN-HYDROCHLOROTHIAZIDE 160-12.5 MG PO TABS: ORAL_TABLET | ORAL | Status: DC

## 2013-07-29 ENCOUNTER — Other Ambulatory Visit: Payer: Self-pay | Admitting: Pulmonary Disease

## 2013-07-29 NOTE — Telephone Encounter (Signed)
ATCpt he has magic jack. Was not able to get through [facility]

## 2014-01-11 ENCOUNTER — Telehealth: Payer: Self-pay | Admitting: Pulmonary Disease

## 2014-01-11 MED ORDER — AMOXICILLIN-POT CLAVULANATE 875-125 MG PO TABS: 1.0000 | ORAL_TABLET | Freq: Two times a day (BID) | ORAL | Status: DC

## 2014-01-11 MED ORDER — PREDNISONE (PAK) 10 MG PO TABS: ORAL_TABLET | Freq: Every day | ORAL | Status: DC

## 2014-01-11 NOTE — Telephone Encounter (Signed)
[phone] callin back

## 2014-01-11 NOTE — Addendum Note (Signed)
Addended by: Rosana Berger on: 01/11/2014 01:51 PM   Modules accepted: Orders

## 2014-01-11 NOTE — Telephone Encounter (Signed)
Spoke with the pt and notified of recs per SN  He verbalized understanding  Rxs were sent to pharm

## 2014-01-11 NOTE — Telephone Encounter (Signed)
Left message with spouse to have pt call the office back. Last ov was 1.9.14.

## 2014-01-11 NOTE — Telephone Encounter (Signed)
Per SN--  Ok to refill augmentin 875 mg  #14  1 po bid pred dosepak 10 mg  6 day pack

## 2014-01-11 NOTE — Telephone Encounter (Signed)
Called spoke with pt. He c/o sinus HA, nasal congestion, prod cough (? Color), sweats,chills, bad taste in mouth. He is requesting to have something called in. Please advise SN thanks  No Known Allergies   Current Outpatient Prescriptions on File Prior to Visit  Medication Sig Dispense Refill  . amoxicillin-clavulanate (AUGMENTIN) 875-125 MG per tablet Take 1 tablet by mouth 2 (two) times daily.  14 tablet  0  . aspirin 81 MG tablet Take 81 mg by mouth daily.        Marland Kitchen atorvastatin (LIPITOR) 20 MG tablet Take 1 tablet (20 mg total) by mouth daily.  90 tablet  3  . Cod Liver Oil 1000 MG CAPS Take 2 capsules by mouth daily.      . Multiple Vitamins-Minerals (MULTIVITAMIN & MINERAL PO) Take 1 tablet by mouth daily.        . saw palmetto 500 MG capsule Take 500 mg by mouth daily.        . sertraline (ZOLOFT) 50 MG tablet Take one tablet by mouth daily x 1 month, then increase to 2 daily thereafter  180 tablet  3  . valsartan-hydrochlorothiazide (DIOVAN-HCT) 160-12.5 MG per tablet TAKE 1 TABLET EVERY DAY  30 tablet  2  . valsartan-hydrochlorothiazide (DIOVAN-HCT) 160-12.5 MG per tablet TAKE 1 TABLET BY MOUTH EVERY DAY (NEEDS TO MAKE APPT.)  30 tablet  2   No current facility-administered medications on file prior to visit.

## 2014-03-14 ENCOUNTER — Telehealth: Payer: Self-pay | Admitting: Pulmonary Disease

## 2014-03-14 NOTE — Telephone Encounter (Signed)
i called spoke with pt. appt scheduled to se SN 05/04/14. Nothing further needed

## 2014-05-04 ENCOUNTER — Ambulatory Visit (INDEPENDENT_AMBULATORY_CARE_PROVIDER_SITE_OTHER): Payer: Commercial Managed Care - HMO | Admitting: Pulmonary Disease

## 2014-05-04 ENCOUNTER — Other Ambulatory Visit (INDEPENDENT_AMBULATORY_CARE_PROVIDER_SITE_OTHER): Payer: Commercial Managed Care - HMO

## 2014-05-04 ENCOUNTER — Encounter: Payer: Self-pay | Admitting: Pulmonary Disease

## 2014-05-04 VITALS — BP 160/100 | HR 54 | Temp 97.0°F | Ht 72.0 in | Wt 226.4 lb

## 2014-05-04 DIAGNOSIS — N320 Bladder-neck obstruction: Secondary | ICD-10-CM

## 2014-05-04 DIAGNOSIS — N400 Enlarged prostate without lower urinary tract symptoms: Secondary | ICD-10-CM

## 2014-05-04 DIAGNOSIS — E7800 Pure hypercholesterolemia, unspecified: Secondary | ICD-10-CM

## 2014-05-04 DIAGNOSIS — F419 Anxiety disorder, unspecified: Secondary | ICD-10-CM

## 2014-05-04 DIAGNOSIS — I10 Essential (primary) hypertension: Secondary | ICD-10-CM

## 2014-05-04 DIAGNOSIS — M8949 Other hypertrophic osteoarthropathy, multiple sites: Secondary | ICD-10-CM

## 2014-05-04 DIAGNOSIS — N3281 Overactive bladder: Secondary | ICD-10-CM

## 2014-05-04 DIAGNOSIS — M159 Polyosteoarthritis, unspecified: Secondary | ICD-10-CM

## 2014-05-04 DIAGNOSIS — D126 Benign neoplasm of colon, unspecified: Secondary | ICD-10-CM

## 2014-05-04 DIAGNOSIS — K5730 Diverticulosis of large intestine without perforation or abscess without bleeding: Secondary | ICD-10-CM

## 2014-05-04 DIAGNOSIS — N200 Calculus of kidney: Secondary | ICD-10-CM

## 2014-05-04 DIAGNOSIS — M150 Primary generalized (osteo)arthritis: Secondary | ICD-10-CM

## 2014-05-04 HISTORY — DX: Benign prostatic hyperplasia without lower urinary tract symptoms: N40.0

## 2014-05-04 LAB — CBC WITH DIFFERENTIAL/PLATELET
Basophils Absolute: 0.1 10*3/uL (ref 0.0–0.1)
Basophils Relative: 0.7 % (ref 0.0–3.0)
Eosinophils Absolute: 0.1 10*3/uL (ref 0.0–0.7)
Eosinophils Relative: 0.9 % (ref 0.0–5.0)
HEMATOCRIT: 42.7 % (ref 39.0–52.0)
HEMOGLOBIN: 14.1 g/dL (ref 13.0–17.0)
Lymphocytes Relative: 13.4 % (ref 12.0–46.0)
Lymphs Abs: 1.1 10*3/uL (ref 0.7–4.0)
MCHC: 33.1 g/dL (ref 30.0–36.0)
MCV: 88.5 fl (ref 78.0–100.0)
MONOS PCT: 7.9 % (ref 3.0–12.0)
Monocytes Absolute: 0.7 10*3/uL (ref 0.1–1.0)
NEUTROS ABS: 6.6 10*3/uL (ref 1.4–7.7)
Neutrophils Relative %: 77.1 % — ABNORMAL HIGH (ref 43.0–77.0)
PLATELETS: 197 10*3/uL (ref 150.0–400.0)
RBC: 4.83 Mil/uL (ref 4.22–5.81)
RDW: 12.4 % (ref 11.5–15.5)
WBC: 8.6 10*3/uL (ref 4.0–10.5)

## 2014-05-04 LAB — HEPATIC FUNCTION PANEL
ALK PHOS: 58 U/L (ref 39–117)
ALT: 15 U/L (ref 0–53)
AST: 20 U/L (ref 0–37)
Albumin: 4.4 g/dL (ref 3.5–5.2)
BILIRUBIN TOTAL: 0.9 mg/dL (ref 0.2–1.2)
Bilirubin, Direct: 0.1 mg/dL (ref 0.0–0.3)
Total Protein: 7.3 g/dL (ref 6.0–8.3)

## 2014-05-04 LAB — LIPID PANEL
Cholesterol: 193 mg/dL (ref 0–200)
HDL: 54 mg/dL (ref >39.00)
LDL Cholesterol: 127 mg/dL — ABNORMAL HIGH (ref 0–99)
NONHDL: 139
TRIGLYCERIDES: 60 mg/dL (ref 0.0–149.0)
Total CHOL/HDL Ratio: 4
VLDL: 12 mg/dL (ref 0.0–40.0)

## 2014-05-04 LAB — BASIC METABOLIC PANEL
BUN: 16 mg/dL (ref 6–23)
CALCIUM: 9.8 mg/dL (ref 8.4–10.5)
CO2: 28 mEq/L (ref 19–32)
Chloride: 107 mEq/L (ref 96–112)
Creatinine, Ser: 1.04 mg/dL (ref 0.40–1.50)
GFR: 90.02 mL/min (ref >60.00)
GLUCOSE: 99 mg/dL (ref 70–99)
Potassium: 4.8 mEq/L (ref 3.5–5.1)
SODIUM: 140 meq/L (ref 135–145)

## 2014-05-04 LAB — PSA: PSA: 0.71 ng/mL (ref 0.10–4.00)

## 2014-05-04 LAB — TSH: TSH: 1.96 u[IU]/mL (ref 0.35–4.50)

## 2014-05-04 MED ORDER — LOSARTAN POTASSIUM-HCTZ 100-12.5 MG PO TABS: 1.0000 | ORAL_TABLET | Freq: Every day | ORAL | Status: DC

## 2014-05-04 MED ORDER — AMOXICILLIN-POT CLAVULANATE 875-125 MG PO TABS: 1.0000 | ORAL_TABLET | Freq: Two times a day (BID) | ORAL | Status: DC

## 2014-05-04 MED ORDER — MIRABEGRON ER 50 MG PO TB24: 50.0000 mg | ORAL_TABLET | Freq: Every day | ORAL | Status: DC

## 2014-05-04 NOTE — Patient Instructions (Signed)
Today we updated your med list in our EPIC system...     For your SINUSITIS >>    Take the AUGMENTIN antibiotic- 875mg  tabs- one tab twice daily til gone...    You may take OTC MUCINEX 600mg  tabs- one tab up to 4 times daily to help w/ the drainage...    You may also try NASACORT OTC nasal spray- 2 sprays in each nostril at bedtime...  For your BP >>    We are restarting BP med- LOSARTAN HCT- take one tab daily...    Remember to eliminate the salt from your diet & work on weight reduction...  For your bladder symptoms...    Try the [facility] one tab daily to see if this decreases the "gotta go" urges...  Today we did your follow up CXR, EKG, & FASTING blood work...    We will contact you w/ the results when available...   Call for any questions...  Let's plan a follow up visit in 3-81mo to re-assess our treatment.Marland KitchenMarland Kitchen

## 2014-05-04 NOTE — Progress Notes (Signed)
Subjective:     Patient ID: Samuel Mendez, male   DOB: December 06, 1940, 74 y.o.   MRN: [id number]  HPI 74 y/o BM here for a follow up visit... he has multiple medical problems as noted below...   ~  April 27, 2010:  Yearly ROV- c/o neck pain & occipital discomfort (XRay shows cerv DDD & arthritis)... otherw feeling well- BP controlled on meds;  Chol looks good on Lip20;  GI stable & up to date;  HU stable & he likes the [employer];  OK Flu shot today...  ~  May 01, 2011:  Yearly ROV & he reports doing fine, no complaints or concerns except that he feels his memory is poor & we discussed reading, working puzzles, etc...  He had 2012 Flu vaccine;  See prob list below>>    BP controlled on DiovanHCT, 138/88 today & he denies CP, palpit, dizzy, SOB, edema, etc...    FLP at goal on diet + Lip20 (see below); continue same, get wt down...    He is due for f/u colonoscopy & has iot sched already w/ drPerry next week...  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    CHOL> on Lip20; wt is overweight & unchanged (225# BMI=31); FLP shows TChol 117, TG 50, HDL 46, LDL 61    GI- Divertics, Polyps> followed by DrPerry & he is overdue for f/u colon- last 12/07 w/ 78mm polyp removed...    GU- Hx stones, prostatitis> no recurrent kidney stones; renal func is wnl w/ Creat=1.2 and PSA=0.61    DJD> uses OTC meds as needed; he has had several falls from ladder; reminded to stay away from ladders!    Anxiety & Depression> He notes bouts of depression, occas tearful, "I get down" & he would like med; try ZOLOFT 50=>100mg /d... We reviewed prob list, meds, xrays and labs> see below for updates >>  CXR 1/14 showed heart is wnl,  lungs clear, NAD... LABS 1/14:  FLP- ok on Lip20;  Chems- wnl;  CBC- wnl;  TSH=1.98;  PSA=0.61  ~  May 04, 2014:  89yr ROV & Jamarious returns for a medical eval & review, feeling well overall x for sinusitis w/ nasal congestion, pressure, drainage of yellow mucus etc; he denies cough, SOB, CP, etc... 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 We reviewed the following medical problems during today's office visit >>     HBP> on ASA81 & off prev DiovanHCT160/12.5; BP= 130/90 & feeling well w/o CP, palpit, SOB, dizzy, edema, etc; he does not want to restart meds, asked to monitor BP at home & call if >150/90...    CHOL> off prev Lip20; wt is overweight & unchanged (226# BMI=31); FLP 1/16 shows TChol 193, TG 60, HDL 54, LDL 127; he does not want to restart meds & we reviewed low chol diet...    GI- Divertics, Polyps> followed by DrPerry & he had f/u colonoscopy 1/13 revealing 3 polyps (tubular adenomas), mild divertics, otherw neg, f/u requested in 5  yrs...     GU- Hx stones, prostatitis> no recurrent kidney stones; renal func is wnl w/ Creat=1.04 and PSA=0.71    DJD> uses OTC meds as needed; he has had several falls from ladder in the past; reminded to stay away from ladders!    Anxiety & Depression> He prev noted bouts of depression, occas tearful, "I get down" & he would requested med rx 1/14; tried Zoloft & improved=> he stopped on his own & doesn't feel he needs meds at this time... We reviewed prob list, meds, xrays and labs> see below for updates >> he had the 2015 Flu vaccine 11/15...  EKG 1/16 showed SBrady, rate50, WNL, NAD...  LABS 1/16 off meds:  FLP- ok x LDL=127;  Chems- wnl;  CBC- wnl;  TSH=1.96;  PSA=0.71...           Problem List:    1.  HYPERTENSION - good control w/ DIOVAN/Hct 160-12.5 daily, & takes ASA 81mg /d...   ~  1/12: BP= 144/84 &  denies HA, fatigue, visual changes, CP, palipit, dizziness, syncope, dyspnea, edema, etc; we reviewed low sodium diet & wt reduction strategies... ~  1/13: BP= 138/88 & he remains asymptomatic; reviewed wt reduction strategies w/ pt... ~  1/14: on ASA81, DiovanHCT 160/12.5; BP= 128/78 & feeling well w/o CP, palpit, SOB, dizzy, edema, etc... ~  1/16: on ASA81 & off prev DiovanHCT160/12.5; BP= 130/90 & feeling well w/o CP, palpit, SOB, dizzy, edema, etc; he does not want to restart meds, asked to monitor BP at home & call if >150/90.   2.  HYPERCHOLESTEROLEMIA & OVERWEIGHT - now on LIPITOR 20mg /d. ~  FLP  11/06 showed TChol 198, TG 64, HDL 43, LDL 142... he preferred diet Rx. ~  FLP 1/09  (wt=236#) showed TChol218, TG 88, HDL 45, LDL 142... start Simvastatin 20mg /d... ~  Vining 4/09 (wt=214#) on Simva20 showed TChol 147, TG 64, HDL 45, LDL 89... intol w/ myalgias, switch to Lipitor20. ~  FLP 7/09 on Lip20 showed TChol 167, TG 51, HDL 45, LDL 112... rec- same meds for now. ~  FLP 1/11 (wt=225#) on Lip20 showed TChol 113, TG 47, HDL 49, LDL 55 ~  FLP 1/12 (wt=232#) showed TChol 127, TG 52, HDL 45, LDL 72 ~  FLP 1/13 (wt=228#) on Lip20 showed TChol 145, TG 61, HDL 55, LDL 78 ~  FLP 1/14 (wt=225#) on Lip20 showed TChol 117, TG 50, HDL 46, LDL 61  ~  FLP 1/16 (wt=226#) off meds showed TChol 193, TG 60, HDL 54, LDL 127; he does not want to restart meds & we reviewed low chol diet.  3.  DIVERTICULOSIS & COLONIC POLYPS - he denies N/V, C/D or change in bowel habits, abd pain, gas etc...  ~  prev colonoscopy was 12/07 by DrPerry & showed divertics & tiny 52mm polyp, f/u rec 5 yrs. ~  1/13: followed by DrPerry & he had f/u colonoscopy 1/13 revealing 3 polyps (tubular adenomas), mild divertics, otherw neg, f/u requested in 5 yrs.  4.  RENAL CALCULUS & Hx of PROSTATITIS - denies any prob w/ urination etc...  ~  labs 1/09 showed PSA= 0.48 ~  labs 1/10 showed PSA= 0.45 ~  labs 1/11 showed BUN=14, Creat=1.2, PSA=  0.44 ~  labs 1/12 showed PSA= 0.49 ~  Labs 1/13 showed BUN=18, Creat=1.1, PSA=0.48 ~  Labs 1/14 showed BUN=20, Creat=1.2, PSA=0.61 ~  Labs 1/16 showed BUN=16, Creat=1.04, PSA=0.71  5.  DEGENERATIVE JOINT DISEASE - he fell off a roof 03/23/07  and hit his R hip area... he went to the [facility] in [place] and had XRays and was told they showed some arthritis, but no fractures... given ETODOLAC for Prn use... ~  8/09 fell off ladder w/ Colles fx left wrist- surg w/ pins per DrHarrison... ~  [facility] films 1/12 showed straightening of the Cx lordosis, disc sp narrowing C5-6, mild osteophytes, calcif atherosclerosis of Arch & right carotid bifurcation...  6.  HEALTH MAINTENANCE - Pneumovax 1/09... Tetanus 1/09... yearly Flu shot in Oct... colon up to date...   Past Surgical History  Procedure Laterality Date  . Colles fracture left wrist after fall  11/2007    Outpatient Encounter Prescriptions as of 05/04/2014  Medication Sig  . aspirin 81 MG tablet Take 81 mg by mouth daily.    . Multiple Vitamins-Minerals (MULTIVITAMIN & MINERAL PO) Take 1 tablet by mouth daily.    . Omega 3 1000 MG CAPS Take 1 capsule by mouth daily.  . saw palmetto 500 MG capsule Take 500 mg by mouth daily.    Marland Kitchen amoxicillin-clavulanate (AUGMENTIN) 875-125 MG per tablet Take 1 tablet by mouth 2 (two) times daily. (Patient not taking: Reported on 05/04/2014)  . amoxicillin-clavulanate (AUGMENTIN) 875-125 MG per tablet Take 1 tablet by mouth 2 (two) times daily. (Patient not taking: Reported on 05/04/2014)  . atorvastatin (LIPITOR) 20 MG tablet Take 1 tablet (20 mg total) by mouth daily. (Patient not taking: Reported on 05/04/2014)  . Cod Liver Oil 1000 MG CAPS Take 2 capsules by mouth daily.  . predniSONE (STERAPRED UNI-PAK) 10 MG tablet Take by mouth daily. 6 day pack as directed (Patient not taking: Reported on 05/04/2014)  . sertraline (ZOLOFT) 50 MG tablet Take one tablet by mouth daily x 1 month, then increase to 2 daily  thereafter (Patient not taking: Reported on 05/04/2014)  . valsartan-hydrochlorothiazide (DIOVAN-HCT) 160-12.5 MG per tablet TAKE 1 TABLET EVERY DAY (Patient not taking: Reported on 05/04/2014)  . valsartan-hydrochlorothiazide (DIOVAN-HCT) 160-12.5 MG per tablet TAKE 1 TABLET BY MOUTH EVERY DAY (NEEDS TO MAKE APPT.) (Patient not taking: Reported on 05/04/2014)    No Known Allergies   Current Medications, Allergies, Past Medical History, Past Surgical History, Family History, and Social History were reviewed in [employer] record.   Review of Systems        See HPI - all other systems neg except as noted... The patient denies anorexia, fever, weight loss, weight gain, vision loss, decreased hearing, hoarseness, chest pain, syncope, dyspnea on exertion, peripheral edema, prolonged cough, headaches, hemoptysis, abdominal pain, melena, hematochezia, severe indigestion/heartburn, hematuria, incontinence, muscle weakness, suspicious skin lesions, transient blindness, difficulty walking, depression, unusual weight change, abnormal bleeding, enlarged lymph nodes, and angioedema.     Objective:   Physical Exam    WD, WN, 74 y/o BM in NAD... GENERAL:  Alert & oriented; pleasant & cooperative... HEENT:  [place]/AT, EOM-wnl, PERRLA, EACs-clear, TMs-wnl, NOSE-clear, THROAT-clear & wnl. NECK:  Supple w/ fairROM; no JVD; normal carotid impulses w/o bruits; no thyromegaly or nodules palpated; no lymphadenopathy. CHEST:  Clear to P & A; without wheezes/ rales/ or rhonchi. HEART:  Regular Rhythm; without murmurs/ rubs/ or gallops. ABDOMEN:  Soft & nontender; normal bowel sounds; no organomegaly or masses detected. RECTAL:  neg- 3+ smooth, stool heme neg... EXT:  s/p left wrist surg, mild arthritic changes; no varicose veins/ venous insuffic/ or edema. NEURO:  CN's intact; motor testing normal; sensory testing normal; gait normal & balance OK. DERM:  No lesions noted; no  rash  etc...  RADIOLOGY DATA:  Reviewed in the EPIC EMR & discussed w/ the patient...  LABORATORY DATA:  Reviewed in the EPIC EMR & discussed w/ the patient...    Assessment:      HBP>  BP is fair off meds; he declines to restart meds at this time; advised NO SALT, get wt down, monitor BP at home & call if pressure >150/90...   CHOL>  Not as good off Lip20 on diet alone; he does not want to restart meds; therefore advised better low chol diet & get wt down...  GI> Divertics, Colon Polyps>  He had f/u colon 1/13 w/ 3 tubular adenomas removed, mild divertics; rec for f/u colon in 5 yrs...  GU> Hx KidStones, Prostatitis>  Renal function & PSA are WNL  DJD>  Aware, stable w/ exercise, OTC Rx...  Other medical issues as noted...     Plan:     Patient's Medications  New Prescriptions   AMOXICILLIN-CLAVULANATE (AUGMENTIN) 875-125 MG PER TABLET    Take 1 tablet by mouth 2 (two) times daily.   LOSARTAN-HYDROCHLOROTHIAZIDE (HYZAAR) 100-12.5 MG PER TABLET    Take 1 tablet by mouth daily.   MIRABEGRON ER (MYRBETRIQ) 50 MG TB24 TABLET    Take 1 tablet (50 mg total) by mouth daily.  Previous Medications   ASPIRIN 81 MG TABLET    Take 81 mg by mouth daily.     ATORVASTATIN (LIPITOR) 20 MG TABLET    Take 1 tablet (20 mg total) by mouth daily.   MULTIPLE VITAMINS-MINERALS (MULTIVITAMIN & MINERAL PO)    Take 1 tablet by mouth daily.     OMEGA 3 1000 MG CAPS    Take 1 capsule by mouth daily.   SAW PALMETTO 500 MG CAPSULE    Take 500 mg by mouth daily.    Modified Medications   No medications on file  Discontinued Medications   AMOXICILLIN-CLAVULANATE (AUGMENTIN) 875-125 MG PER TABLET    Take 1 tablet by mouth 2 (two) times daily.   AMOXICILLIN-CLAVULANATE (AUGMENTIN) 875-125 MG PER TABLET    Take 1 tablet by mouth 2 (two) times daily.   COD LIVER OIL 1000 MG CAPS    Take 2 capsules by mouth daily.   PREDNISONE (STERAPRED UNI-PAK) 10 MG TABLET    Take by mouth daily. 6 day pack as directed    SERTRALINE (ZOLOFT) 50 MG TABLET    Take one tablet by mouth daily x 1 month, then increase to 2 daily thereafter   VALSARTAN-HYDROCHLOROTHIAZIDE (DIOVAN-HCT) 160-12.5 MG PER TABLET    TAKE 1 TABLET EVERY DAY   VALSARTAN-HYDROCHLOROTHIAZIDE (DIOVAN-HCT) 160-12.5 MG PER TABLET    TAKE 1 TABLET BY MOUTH EVERY DAY (NEEDS TO MAKE APPT.)

## 2014-08-22 ENCOUNTER — Ambulatory Visit (INDEPENDENT_AMBULATORY_CARE_PROVIDER_SITE_OTHER): Payer: Commercial Managed Care - HMO | Admitting: Family Medicine

## 2014-08-22 ENCOUNTER — Encounter: Payer: Self-pay | Admitting: Family Medicine

## 2014-08-22 DIAGNOSIS — N400 Enlarged prostate without lower urinary tract symptoms: Secondary | ICD-10-CM | POA: Diagnosis not present

## 2014-08-22 DIAGNOSIS — I10 Essential (primary) hypertension: Secondary | ICD-10-CM | POA: Diagnosis not present

## 2014-08-22 DIAGNOSIS — E7800 Pure hypercholesterolemia, unspecified: Secondary | ICD-10-CM

## 2014-08-22 DIAGNOSIS — D126 Benign neoplasm of colon, unspecified: Secondary | ICD-10-CM

## 2014-08-22 LAB — LDL CHOLESTEROL, DIRECT: LDL DIRECT: 99 mg/dL

## 2014-08-22 NOTE — Assessment & Plan Note (Signed)
poor control last visit off atorvastatin but has restarted in January. Check direct ldl today

## 2014-08-22 NOTE — Progress Notes (Signed)
Samuel Reddish, MD Phone: [phone]  Subjective:  Patient presents today to establish care with me as their new primary care provider. Patient was formerly a patient of Dr. Lenna Gilford. Chief complaint-noted.   Hyperlipidemia-poor control last visit off atorvastatin but has restarted in january  Lab Results  Component Value Date   LDLCALC 127* 05/04/2014   On statin: atorvastatin 20mg  Regular exercise: no ROS- no chest pain or shortness of breath. No myalgias  BPH- improved Nocturia 0-1x a night. Was having urgency but used myrbetriq for a month for presumed overactive bladder and resolved even when he came off of medication. On saw palmetto ROS- no dysuria, penile discharge  Hypertension-controlled  BP Readings from Last 3 Encounters:  08/22/14 120/68  05/04/14 160/100  04/30/12 128/78  Compliant with medications-yes without side effects ROS-Denies any CP, HA, SOB, blurry vision, LE edema.   The following were reviewed and entered/updated in epic: Past Medical History  Diagnosis Date  . Hypertension   . Hypercholesteremia   . Obesity   . Diverticulosis of colon   . Hx of colonic polyps   . Prostatitis   . DJD (degenerative joint disease)   . RENAL CALCULUS 04/22/2007    x3, [place] doctor released him   Patient Active Problem List   Diagnosis Date Noted  . BPH (benign prostatic hyperplasia) 05/04/2014    Priority: Medium  . Anxiety and depression 04/30/2012    Priority: Medium  . HYPERCHOLESTEROLEMIA 04/24/2007    Priority: Medium  . Essential hypertension 04/22/2007    Priority: Medium  . Obesity 05/01/2011    Priority: Low  . COLONIC POLYPS 04/24/2007    Priority: Low  . Osteoarthritis 04/22/2007    Priority: Low   Past Surgical History  Procedure Laterality Date  . Colles fracture left wrist after fall  11/2007  . Anal fistula repair      in 90s-outpatient repair    Family History  Problem Relation Age of Onset  . Colon cancer Neg Hx   .  Pancreatic cancer Father   . Hypertension Mother   . Aneurysm Mother     brain died at 57    Medications- reviewed and updated Current Outpatient Prescriptions  Medication Sig Dispense Refill  . aspirin 81 MG tablet Take 81 mg by mouth daily.      Marland Kitchen atorvastatin (LIPITOR) 20 MG tablet Take 1 tablet (20 mg total) by mouth daily. 90 tablet 3  . losartan-hydrochlorothiazide (HYZAAR) 100-12.5 MG per tablet Take 1 tablet by mouth daily. 30 tablet 6  . mirabegron ER (MYRBETRIQ) 50 MG TB24 tablet Take 1 tablet (50 mg total) by mouth daily. 30 tablet 6  . Multiple Vitamins-Minerals (MULTIVITAMIN & MINERAL PO) Take 1 tablet by mouth daily.      . Omega 3 1000 MG CAPS Take 1 capsule by mouth daily.    . saw palmetto 500 MG capsule Take 500 mg by mouth daily.       No current facility-administered medications for this visit.    Allergies-reviewed and updated No Known Allergies  History   Social History  . Marital Status: Married    Spouse Name: cora x 40 yrs  . Number of Children: 3  . Years of Education: N/A   Occupational History  . retired from truck driving in 1497   . 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 Smoking status: Current Some Day Smoker    Types: Cigars  . Smokeless tobacco: Never  Used     Comment: 1-2 per week of cigars only after retirement  . Alcohol Use: No  . Drug Use: No  . Sexual Activity: Not on file   Other Topics Concern  . Not on file   Social History Narrative   Married (wife pt elsewhere). 3 children, 2 from current wife. 4 grandkids. No greatgrandkids.       Farms-raises cattle. Retired from truck driving.       Hobbies: works on old cars (25 [employer]), travel    ROS--See HPI   Objective: BP 120/68 mmHg  Pulse 62  Temp(Src) 98 F (36.7 C)  Wt 226 lb (102.513 kg) Gen: NAD, resting comfortably HEENT: Mucous membranes are moist. Oropharynx normal CV: RRR no murmurs rubs or gallops Lungs: CTAB no crackles,  wheeze, rhonchi Abdomen: soft/nontender/nondistended/normal bowel sounds. No rebound or guarding. obese Ext: no edema Skin: warm, dry, no rash Neuro: grossly normal, moves all extremities, PERRLA  Assessment/Plan:  HYPERCHOLESTEROLEMIA poor control last visit off atorvastatin but has restarted in January. Check direct ldl today   Essential hypertension Continue as controlled on Losartan-hctz 100-12.5mg    BPH (benign prostatic hyperplasia) Myrbetriq per Dr. Lenna Gilford and patient took for a month and urgency portion improved. Has nocturia 0-1x a night and continues saw palmetto. Could consider flomax if recurred as suspicious of BPH as primary cause   encouraged healthy eating and regular exercise (currently none) CPE in January 2017  Orders Placed This Encounter  Procedures  . LDL cholesterol, direct    [place]

## 2014-08-22 NOTE — Assessment & Plan Note (Signed)
Myrbetriq per Dr. Lenna Gilford and patient took for a month and urgency portion improved. Has nocturia 0-1x a night and continues saw palmetto. Could consider flomax if recurred as suspicious of BPH as primary cause

## 2014-08-22 NOTE — Assessment & Plan Note (Signed)
Continue as controlled on Losartan-hctz 100-12.5mg 

## 2014-08-22 NOTE — Patient Instructions (Addendum)
Check with insurance regarding Zostavax.  Make sure cholesterol is by checking labs today  I would advise not smoking at all   See me next January for a physical (after the date of last years)

## 2014-09-05 ENCOUNTER — Ambulatory Visit: Payer: Commercial Managed Care - HMO | Admitting: Pulmonary Disease

## 2014-10-26 ENCOUNTER — Encounter: Payer: Self-pay | Admitting: Internal Medicine

## 2014-12-06 ENCOUNTER — Other Ambulatory Visit: Payer: Self-pay | Admitting: Pulmonary Disease

## 2014-12-08 ENCOUNTER — Other Ambulatory Visit: Payer: Self-pay | Admitting: Pulmonary Disease

## 2015-01-07 ENCOUNTER — Other Ambulatory Visit: Payer: Self-pay | Admitting: Family Medicine

## 2015-01-09 ENCOUNTER — Telehealth: Payer: Self-pay | Admitting: Family Medicine

## 2015-01-09 MED ORDER — LOSARTAN POTASSIUM-HCTZ 100-12.5 MG PO TABS: 1.0000 | ORAL_TABLET | Freq: Every day | ORAL | Status: DC

## 2015-01-09 NOTE — Telephone Encounter (Signed)
Pt would like to know if you could resend his rx losartan-hydrochlorothiazide (HYZAAR) 100-12.5 MG per tablet And change it to a 90 day w/ a refill? Pt states it is less expensive for him that way.  [facility]na [place]

## 2015-01-09 NOTE — Telephone Encounter (Signed)
Medication resent

## 2015-01-26 ENCOUNTER — Telehealth: Payer: Self-pay | Admitting: Family Medicine

## 2015-01-26 ENCOUNTER — Ambulatory Visit (INDEPENDENT_AMBULATORY_CARE_PROVIDER_SITE_OTHER): Payer: Commercial Managed Care - HMO

## 2015-01-26 DIAGNOSIS — Z23 Encounter for immunization: Secondary | ICD-10-CM

## 2015-01-26 DIAGNOSIS — H938X9 Other specified disorders of ear, unspecified ear: Secondary | ICD-10-CM

## 2015-01-26 DIAGNOSIS — J3489 Other specified disorders of nose and nasal sinuses: Secondary | ICD-10-CM

## 2015-01-26 NOTE — Telephone Encounter (Signed)
Did he say what he needs to be seen for?

## 2015-01-26 NOTE — Telephone Encounter (Signed)
Patient came in today wanting to a referral for ENT.

## 2015-03-02 ENCOUNTER — Ambulatory Visit (INDEPENDENT_AMBULATORY_CARE_PROVIDER_SITE_OTHER): Payer: Commercial Managed Care - HMO | Admitting: Otolaryngology

## 2015-03-02 DIAGNOSIS — H900 Conductive hearing loss, bilateral: Secondary | ICD-10-CM

## 2015-03-02 DIAGNOSIS — H6123 Impacted cerumen, bilateral: Secondary | ICD-10-CM

## 2015-03-02 DIAGNOSIS — J310 Chronic rhinitis: Secondary | ICD-10-CM

## 2015-03-02 DIAGNOSIS — J342 Deviated nasal septum: Secondary | ICD-10-CM | POA: Diagnosis not present

## 2015-03-06 ENCOUNTER — Other Ambulatory Visit (INDEPENDENT_AMBULATORY_CARE_PROVIDER_SITE_OTHER): Payer: Self-pay | Admitting: Otolaryngology

## 2015-03-06 DIAGNOSIS — J320 Chronic maxillary sinusitis: Secondary | ICD-10-CM

## 2015-03-09 ENCOUNTER — Ambulatory Visit (HOSPITAL_COMMUNITY)
Admission: RE | Admit: 2015-03-09 | Discharge: 2015-03-09 | Disposition: A | Discharge status: Home / Self Care | Payer: Commercial Managed Care - HMO | Source: Ambulatory Visit | Attending: Otolaryngology | Admitting: Otolaryngology

## 2015-03-09 DIAGNOSIS — J320 Chronic maxillary sinusitis: Secondary | ICD-10-CM | POA: Diagnosis not present

## 2015-03-30 ENCOUNTER — Ambulatory Visit (INDEPENDENT_AMBULATORY_CARE_PROVIDER_SITE_OTHER): Payer: Commercial Managed Care - HMO | Admitting: Otolaryngology

## 2015-03-30 DIAGNOSIS — J322 Chronic ethmoidal sinusitis: Secondary | ICD-10-CM

## 2015-03-30 DIAGNOSIS — J323 Chronic sphenoidal sinusitis: Secondary | ICD-10-CM

## 2015-03-30 DIAGNOSIS — J321 Chronic frontal sinusitis: Secondary | ICD-10-CM | POA: Diagnosis not present

## 2015-03-30 DIAGNOSIS — J320 Chronic maxillary sinusitis: Secondary | ICD-10-CM

## 2015-03-31 ENCOUNTER — Other Ambulatory Visit: Payer: Self-pay | Admitting: Otolaryngology

## 2015-05-03 ENCOUNTER — Encounter (HOSPITAL_BASED_OUTPATIENT_CLINIC_OR_DEPARTMENT_OTHER): Payer: Self-pay | Admitting: *Deleted

## 2015-05-04 ENCOUNTER — Encounter (HOSPITAL_COMMUNITY)
Admission: RE | Admit: 2015-05-04 | Discharge: 2015-05-04 | Disposition: A | Discharge status: Home / Self Care | Payer: PPO | Source: Ambulatory Visit | Attending: Otolaryngology | Admitting: Otolaryngology

## 2015-05-04 DIAGNOSIS — Z01818 Encounter for other preprocedural examination: Secondary | ICD-10-CM | POA: Diagnosis not present

## 2015-05-04 DIAGNOSIS — J342 Deviated nasal septum: Secondary | ICD-10-CM | POA: Diagnosis not present

## 2015-05-04 DIAGNOSIS — I10 Essential (primary) hypertension: Secondary | ICD-10-CM | POA: Diagnosis not present

## 2015-05-04 DIAGNOSIS — R001 Bradycardia, unspecified: Secondary | ICD-10-CM | POA: Diagnosis not present

## 2015-05-04 DIAGNOSIS — Z01812 Encounter for preprocedural laboratory examination: Secondary | ICD-10-CM | POA: Diagnosis not present

## 2015-05-04 LAB — BASIC METABOLIC PANEL
Anion gap: 8 (ref 5–15)
BUN: 17 mg/dL (ref 6–20)
CO2: 27 mmol/L (ref 22–32)
CREATININE: 1.23 mg/dL (ref 0.61–1.24)
Calcium: 9.3 mg/dL (ref 8.9–10.3)
Chloride: 106 mmol/L (ref 101–111)
GFR calc Af Amer: >60 mL/min (ref >60)
GFR, EST NON AFRICAN AMERICAN: 56 mL/min — AB (ref >60)
Glucose, Bld: 101 mg/dL — ABNORMAL HIGH (ref 65–99)
Potassium: 4 mmol/L (ref 3.5–5.1)
SODIUM: 141 mmol/L (ref 135–145)

## 2015-05-08 ENCOUNTER — Ambulatory Visit (HOSPITAL_BASED_OUTPATIENT_CLINIC_OR_DEPARTMENT_OTHER)
Admission: RE | Admit: 2015-05-08 | Discharge: 2015-05-08 | Disposition: A | Discharge status: Home / Self Care | Payer: PPO | Source: Ambulatory Visit | Attending: Otolaryngology | Admitting: Otolaryngology

## 2015-05-08 ENCOUNTER — Encounter (HOSPITAL_BASED_OUTPATIENT_CLINIC_OR_DEPARTMENT_OTHER)
Admission: RE | Disposition: A | Discharge status: Home / Self Care | Payer: Self-pay | Source: Ambulatory Visit | Attending: Otolaryngology

## 2015-05-08 ENCOUNTER — Ambulatory Visit (HOSPITAL_BASED_OUTPATIENT_CLINIC_OR_DEPARTMENT_OTHER): Payer: PPO | Admitting: Anesthesiology

## 2015-05-08 ENCOUNTER — Encounter (HOSPITAL_BASED_OUTPATIENT_CLINIC_OR_DEPARTMENT_OTHER): Payer: Self-pay | Admitting: Anesthesiology

## 2015-05-08 DIAGNOSIS — J324 Chronic pansinusitis: Secondary | ICD-10-CM | POA: Insufficient documentation

## 2015-05-08 DIAGNOSIS — J342 Deviated nasal septum: Secondary | ICD-10-CM | POA: Diagnosis not present

## 2015-05-08 DIAGNOSIS — J3489 Other specified disorders of nose and nasal sinuses: Secondary | ICD-10-CM | POA: Diagnosis not present

## 2015-05-08 DIAGNOSIS — J323 Chronic sphenoidal sinusitis: Secondary | ICD-10-CM

## 2015-05-08 DIAGNOSIS — I10 Essential (primary) hypertension: Secondary | ICD-10-CM | POA: Insufficient documentation

## 2015-05-08 DIAGNOSIS — J320 Chronic maxillary sinusitis: Secondary | ICD-10-CM | POA: Diagnosis not present

## 2015-05-08 DIAGNOSIS — J322 Chronic ethmoidal sinusitis: Secondary | ICD-10-CM

## 2015-05-08 DIAGNOSIS — F17210 Nicotine dependence, cigarettes, uncomplicated: Secondary | ICD-10-CM | POA: Diagnosis not present

## 2015-05-08 DIAGNOSIS — J321 Chronic frontal sinusitis: Secondary | ICD-10-CM | POA: Diagnosis not present

## 2015-05-08 DIAGNOSIS — J338 Other polyp of sinus: Secondary | ICD-10-CM | POA: Diagnosis not present

## 2015-05-08 HISTORY — DX: Deviated nasal septum: J34.2

## 2015-05-08 HISTORY — PX: SINUS ENDO WITH FUSION: SHX5329

## 2015-05-08 HISTORY — PX: SEPTOPLASTY: SHX2393

## 2015-05-08 SURGERY — SURGERY, PARANASAL SINUS, ENDOSCOPIC, WITH NASAL SEPTOPLASTY, TURBINOPLASTY, AND MAXILLARY SINUSOTOMY
Anesthesia: General | Site: Nose

## 2015-05-08 MED ORDER — MUPIROCIN 2 % EX OINT
TOPICAL_OINTMENT | CUTANEOUS | Status: DC | PRN
  Administered 2015-05-08: 1 via NASAL

## 2015-05-08 MED ORDER — AMOXICILLIN 875 MG PO TABS: 875.0000 mg | ORAL_TABLET | Freq: Two times a day (BID) | ORAL | Status: DC

## 2015-05-08 MED ORDER — LIDOCAINE-EPINEPHRINE 1 %-1:100000 IJ SOLN
INTRAMUSCULAR | Status: DC | PRN
  Administered 2015-05-08: 2 mL

## 2015-05-08 MED ORDER — OXYCODONE HCL 5 MG PO TABS
ORAL_TABLET | ORAL | Status: AC
  Filled 2015-05-08: qty 1

## 2015-05-08 MED ORDER — OXYCODONE-ACETAMINOPHEN 5-325 MG PO TABS: 1.0000 | ORAL_TABLET | ORAL | Status: DC | PRN

## 2015-05-08 MED ORDER — OXYCODONE HCL 5 MG PO TABS
5.0000 mg | ORAL_TABLET | Freq: Once | ORAL | Status: AC | PRN
  Administered 2015-05-08: 5 mg via ORAL

## 2015-05-08 MED ORDER — LIDOCAINE HCL (CARDIAC) 20 MG/ML IV SOLN
INTRAVENOUS | Status: DC | PRN
  Administered 2015-05-08: 50 mg via INTRAVENOUS

## 2015-05-08 MED ORDER — ONDANSETRON HCL 4 MG/2ML IJ SOLN: 4.0000 mg | Freq: Four times a day (QID) | INTRAMUSCULAR | Status: DC | PRN

## 2015-05-08 MED ORDER — HYDROMORPHONE HCL 1 MG/ML IJ SOLN
INTRAMUSCULAR | Status: AC
  Filled 2015-05-08: qty 1

## 2015-05-08 MED ORDER — OXYCODONE HCL 5 MG/5ML PO SOLN: 5.0000 mg | Freq: Once | ORAL | Status: AC | PRN

## 2015-05-08 MED ORDER — SCOPOLAMINE 1 MG/3DAYS TD PT72: 1.0000 | MEDICATED_PATCH | Freq: Once | TRANSDERMAL | Status: DC

## 2015-05-08 MED ORDER — LABETALOL HCL 5 MG/ML IV SOLN
5.0000 mg | Freq: Once | INTRAVENOUS | Status: AC
  Administered 2015-05-08: 5 mg via INTRAVENOUS

## 2015-05-08 MED ORDER — ONDANSETRON HCL 4 MG/2ML IJ SOLN
INTRAMUSCULAR | Status: DC | PRN
  Administered 2015-05-08: 4 mg via INTRAVENOUS

## 2015-05-08 MED ORDER — LACTATED RINGERS IV SOLN
INTRAVENOUS | Status: DC
  Administered 2015-05-08 (×2): via INTRAVENOUS

## 2015-05-08 MED ORDER — CEFAZOLIN SODIUM-DEXTROSE 2-3 GM-% IV SOLR
INTRAVENOUS | Status: AC
  Filled 2015-05-08: qty 50

## 2015-05-08 MED ORDER — HYDROMORPHONE HCL 1 MG/ML IJ SOLN
0.2500 mg | INTRAMUSCULAR | Status: DC | PRN
  Administered 2015-05-08: 0.5 mg via INTRAVENOUS

## 2015-05-08 MED ORDER — OXYMETAZOLINE HCL 0.05 % NA SOLN
NASAL | Status: DC | PRN
  Administered 2015-05-08: 1 via TOPICAL

## 2015-05-08 MED ORDER — FENTANYL CITRATE (PF) 100 MCG/2ML IJ SOLN
INTRAMUSCULAR | Status: AC
  Filled 2015-05-08: qty 2

## 2015-05-08 MED ORDER — SUCCINYLCHOLINE CHLORIDE 20 MG/ML IJ SOLN
INTRAMUSCULAR | Status: DC | PRN
  Administered 2015-05-08: 100 mg via INTRAVENOUS

## 2015-05-08 MED ORDER — LABETALOL HCL 5 MG/ML IV SOLN
10.0000 mg | INTRAVENOUS | Status: DC | PRN
Start: 2015-05-08 — End: 2015-05-08

## 2015-05-08 MED ORDER — MIDAZOLAM HCL 2 MG/2ML IJ SOLN: 1.0000 mg | INTRAMUSCULAR | Status: DC | PRN

## 2015-05-08 MED ORDER — FUROSEMIDE 10 MG/ML IJ SOLN
INTRAMUSCULAR | Status: AC
  Filled 2015-05-08: qty 2

## 2015-05-08 MED ORDER — CEFAZOLIN SODIUM-DEXTROSE 2-3 GM-% IV SOLR
INTRAVENOUS | Status: DC | PRN
  Administered 2015-05-08: 2 g via INTRAVENOUS

## 2015-05-08 MED ORDER — BACITRACIN ZINC 500 UNIT/GM EX OINT
TOPICAL_OINTMENT | CUTANEOUS | Status: DC | PRN
  Administered 2015-05-08: 1 via TOPICAL

## 2015-05-08 MED ORDER — GLYCOPYRROLATE 0.2 MG/ML IJ SOLN: 0.2000 mg | Freq: Once | INTRAMUSCULAR | Status: DC | PRN

## 2015-05-08 MED ORDER — PROPOFOL 10 MG/ML IV BOLUS
INTRAVENOUS | Status: DC | PRN
  Administered 2015-05-08: 200 mg via INTRAVENOUS

## 2015-05-08 MED ORDER — EPHEDRINE SULFATE 50 MG/ML IJ SOLN
INTRAMUSCULAR | Status: DC | PRN
  Administered 2015-05-08 (×6): 10 mg via INTRAVENOUS

## 2015-05-08 MED ORDER — LABETALOL HCL 5 MG/ML IV SOLN
INTRAVENOUS | Status: AC
  Filled 2015-05-08: qty 4

## 2015-05-08 MED ORDER — FENTANYL CITRATE (PF) 100 MCG/2ML IJ SOLN
50.0000 ug | INTRAMUSCULAR | Status: AC | PRN
  Administered 2015-05-08: 50 ug via INTRAVENOUS
  Administered 2015-05-08: 100 ug via INTRAVENOUS
  Administered 2015-05-08: 50 ug via INTRAVENOUS

## 2015-05-08 SURGICAL SUPPLY — 51 items
ATTRACTOMAT 16X20 MAGNETIC DRP (DRAPES) IMPLANT
BLADE ROTATE RAD 12 4 M4 (BLADE) IMPLANT
BLADE ROTATE RAD 40 4 M4 (BLADE) IMPLANT
BLADE ROTATE TRICUT 4X13 M4 (BLADE) ×3 IMPLANT
BLADE SURG 15 STRL LF DISP TIS (BLADE) ×2 IMPLANT
BLADE SURG 15 STRL SS (BLADE) ×1
BLADE TRICUT ROTATE M4 4 5PK (BLADE) IMPLANT
BUR HS RAD FRONTAL 3 (BURR) ×3 IMPLANT
CANISTER SUC SOCK COL 7IN (MISCELLANEOUS) ×6 IMPLANT
CANISTER SUCT 1200ML W/VALVE (MISCELLANEOUS) ×9 IMPLANT
COAGULATOR SUCT 8FR VV (MISCELLANEOUS) ×3 IMPLANT
DECANTER SPIKE VIAL GLASS SM (MISCELLANEOUS) ×3 IMPLANT
DRSG NASAL KENNEDY LMNT 8CM (GAUZE/BANDAGES/DRESSINGS) ×6 IMPLANT
DRSG NASOPORE 8CM (GAUZE/BANDAGES/DRESSINGS) IMPLANT
DRSG TELFA 3X8 NADH (GAUZE/BANDAGES/DRESSINGS) IMPLANT
ELECT REM PT RETURN 9FT ADLT (ELECTROSURGICAL) ×3
ELECTRODE REM PT RTRN 9FT ADLT (ELECTROSURGICAL) ×2 IMPLANT
GLOVE BIO SURGEON STRL SZ7.5 (GLOVE) ×3 IMPLANT
GLOVE BIO SURGEON STRL SZ8 (GLOVE) ×3 IMPLANT
GLOVE SURG SS PI 7.0 STRL IVOR (GLOVE) ×3 IMPLANT
GOWN STRL REUS W/ TWL LRG LVL3 (GOWN DISPOSABLE) ×4 IMPLANT
GOWN STRL REUS W/TWL LRG LVL3 (GOWN DISPOSABLE) ×2
HEMOSTAT SURGICEL 2X14 (HEMOSTASIS) IMPLANT
HIGH SPEED TAPERED DIAMOND BUR, 30K ×3 IMPLANT
IV NS 1000ML (IV SOLUTION)
IV NS 1000ML BAXH (IV SOLUTION) IMPLANT
IV NS 500ML (IV SOLUTION) ×1
IV NS 500ML BAXH (IV SOLUTION) ×2 IMPLANT
NEEDLE HYPO 25X1 1.5 SAFETY (NEEDLE) ×3 IMPLANT
NEEDLE SPNL 25GX3.5 QUINCKE BL (NEEDLE) IMPLANT
NS IRRIG 1000ML POUR BTL (IV SOLUTION) ×3 IMPLANT
PACK BASIN DAY SURGERY FS (CUSTOM PROCEDURE TRAY) ×3 IMPLANT
PACK ENT DAY SURGERY (CUSTOM PROCEDURE TRAY) ×3 IMPLANT
SLEEVE SCD COMPRESS KNEE MED (MISCELLANEOUS) ×3 IMPLANT
SOLUTION BUTLER CLEAR DIP (MISCELLANEOUS) ×3 IMPLANT
SPLINT NASAL AIRWAY SILICONE (MISCELLANEOUS) ×3 IMPLANT
SPONGE GAUZE 2X2 8PLY STRL LF (GAUZE/BANDAGES/DRESSINGS) ×3 IMPLANT
SPONGE NEURO XRAY DETECT 1X3 (DISPOSABLE) ×3 IMPLANT
SUT CHROMIC 4 0 P 3 18 (SUTURE) ×3 IMPLANT
SUT PLAIN 4 0 ~~LOC~~ 1 (SUTURE) ×3 IMPLANT
SUT PROLENE 3 0 PS 2 (SUTURE) ×3 IMPLANT
SUT VIC AB 4-0 P-3 18XBRD (SUTURE) IMPLANT
SUT VIC AB 4-0 P3 18 (SUTURE)
TOWEL OR 17X24 6PK STRL BLUE (TOWEL DISPOSABLE) ×3 IMPLANT
TRACKER ENT INSTRUMENT (MISCELLANEOUS) ×3 IMPLANT
TRACKER ENT PATIENT (MISCELLANEOUS) ×3 IMPLANT
TUBE CONNECTING 20X1/4 (TUBING) ×3 IMPLANT
TUBE SALEM SUMP 16 FR W/ARV (TUBING) IMPLANT
TUBING STRAIGHTSHOT EPS 5PK (TUBING) ×3 IMPLANT
XEROGEL NASAL/EPISTAXIS PACK ×3 IMPLANT
YANKAUER SUCT BULB TIP NO VENT (SUCTIONS) ×3 IMPLANT

## 2015-05-08 NOTE — H&P (Signed)
Cc: Recurrent sinusitis, chronic nasal congestion  HPI: The patient is a 75 year old male who returns today for his follow-up evaluation. The patient was last seen 1 month ago. At that time, he was noted to have significant nasal mucosal congestion, nasal septal deviation, consistent with chronic rhinitis.  The patient has a history of frequent recurrent sinusitis.  He was previously treated with multiple courses of antibiotics, systemic and topical steroids.  He underwent a paranasal sinus CT scan.  The CT showed near complete opacification of his bilateral maxillary sinuses.  Hyperdense tissue was noted within his maxillary sinuses, consistent with fungal sinusitis.  In addition, his ethmoid, frontal recesses and sphenoid sinuses are also partially opacified. The patient returns today complaining of persistent facial pressure and discomfort. He continues to have frequent foul odor in his nose. He is interested in more definitive treatment of his chronic rhinosinusitis.   Exam: The nasal cavities were decongested and anesthetised with a combination of oxymetazoline and 4% lidocaine solution.  The flexible scope was inserted into the right nasal cavity.  Endoscopy of the inferior and middle meatus was performed.  Edematous mucosa was noted. Severe nasal septal deviation to the right.   Olfactory cleft was clear.  Nasopharynx was clear.  Turbinates were hypertrophied but without mass.   The procedure was repeated on the contralateral side with similar findings.  The patient tolerated the procedure well.  Instructions were given to avoid eating or drinking for 2 hours.    Assessment:  Severe nasal septal deviation to the right, with bilateral pansinusitis.  The CT findings are also concerning for bilateral chronic maxillary fungal sinusitis.   Plan: 1.  The nasal endoscopy findings and the CT images are extensively reviewed with the patient and his wife.  2.  In light of the above findings, the patient  will likely benefit from undergoing bilateral endoscopic surgery and septoplasty.  The risks, benefits, alternatives and details of the procedures are extensively discussed.  3.  The patient would like to proceed with the procedure.  4.  We will schedule the procedure in accordance with the patient's schedule.

## 2015-05-08 NOTE — Discharge Instructions (Addendum)

## 2015-05-08 NOTE — Op Note (Signed)
DATE OF PROCEDURE:  05/08/2015                              OPERATIVE REPORT  SURGEON:  Leta Baptist, MD  PREOPERATIVE DIAGNOSES: 1. Bilateral chronic pansinusitis 2. Nasal septal deviation 3. Chronic nasal obstruction  POSTOPERATIVE DIAGNOSES: 1. Bilateral chronic pansinusitis, with fungal balls removed from bilateral maxillary sinuses and right sphenoid sinus 2. Nasal septal deviation 3. Chronic nasal obstruction  PROCEDURE PERFORMED:   1. Bilateral endoscopic frontal sinusotomy with polyp removal. 2. Bilateral endoscopic total ethmoidectomy 3. Bilateral endoscopic sphenoidectomy with tissue removal 4. Bilateral endoscopic maxillary antrostomy with tissue removal 5. Septoplasty 6. FUSION stereotactic image guidance  ANESTHESIA:  General endotracheal tube anesthesia.  COMPLICATIONS:  None.  ESTIMATED BLOOD LOSS:  100 ml  INDICATION FOR PROCEDURE:  Waylyn W Terrel is a 75 y.o. male with a history of bilateral chronic pansinusitis, chronic nasal congestion, and facial pressure. He was previously treated with multiple courses of antibiotics. He was also treated with systemic and topical steroids, antihistamine, and decongestant. However he continues to be symptomatic. On his sinus CT scan, he was noted to have near complete opacification of his bilateral maxillary sinuses and the right sphenoid sinus, as well as partial opacifications of all remaining sinuses. Hyperdense tissue was also noted within his maxillary and sphenoid sinuses, suggestive of fungal sinusitis. He also has a history of nasal septal deviation, contributing to his chronic nasal obstruction. Based on the above findings, the decision was made for patient to undergo the above-stated procedures. Likelihood of success in reducing symptoms was also discussed.  The risks, benefits, alternatives, and details of the procedure were discussed with the mother.  Questions were invited and answered.  Informed consent was  obtained.  DESCRIPTION:  The patient was taken to the operating room and placed supine on the operating table.  General endotracheal tube anesthesia was administered by the anesthesiologist.  The patient was positioned and prepped and draped in a standard fashion for sinonasal surgery. The FUSION stereotactic image guidance marker was placed. The image guidance system was functional throughout the case. 1% lidocaine with 1-100,000 epinephrine was infiltrated onto the nasal septum and the lateral nasal walls. Pledgets soaked with Afrin were also placed for vasoconstriction. The pledgets were subsequently removed.  Attention was first focused on the septum. A standard hemitransfixion incision was made on the left side. The mucosal flap was elevated on the left side in the standard fashion. A cartilaginous incision was made 1 cm superior to the caudal margin of the septum. The contralateral mucosal flap was also elevated. The deviated portion of the cartilaginous and bony septum were removed. The cartilage was morselized and replaced. The septum was quilted with 4-0 plain gut sutures. The hemitransfixion incision was closed with interrupted 4-0 chromic sutures.  Attention was then focused on the paranasal sinuses. The left middle turbinate was carefully medialized. A large amount of polypoid tissue and purulent drainage was noted within the left middle meatus. The uncinate process was resected with a freer elevator. The maxillary sinus was entered. The maxillary antrum was enlarged using a combination of Tru-Cut forceps, backbiter, and microdebrider. The left maxillary sinus was noted to be filled with purulent debris and fungal balls. The content of the entire maxillary sinus was evacuated. The specimens were sent to the pathology department. The left anterior and posterior ethmoid sinuses were then entered. The bony partitions were removed using a combination of  Tru-Cut forceps and microdebrider. Polypoid  tissue was removed from the anterior and posterior ethmoid sinuses. Attention was then focused on the frontal sinus. The frontal recess was entered and enlarged using Tru-Cut forceps and microdebrider. Polypoid tissue was removed. The frontal recess was then enlarged using a balloon dilator. Attention was then turned to the sphenoid sinus. The sphenoid opening was entered and enlarged. Polypoid tissue was removed from the sphenoid sinus. All the sinuses were copiously irrigated.  Attention was then focused on the right paranasal sinuses. The same procedures were repeated on the right side without exception. It should be noted that a large amount of fungal balls were noted within the right maxillary and sphenoid sinuses. Due to the severe sclerosis of the anterior sphenoid wall, the sphenoid opening on the right site was enlarged using a powered drill. All the sinuses were irrigated with saline irrigation.  Doyle splints were applied to the nasal septum bilaterally.The care of the patient was turned over to the anesthesiologist.  The patient was awakened from anesthesia without difficulty.  He was extubated and transferred to the recovery room in good condition.  OPERATIVE FINDINGS:  Bilateral chronic pansinusitis, with a large amount of fungal debris removed from bilateral maxillary sinuses and the right sphenoid sinus. Nasal septal deviation.  SPECIMEN:  Bilateral sinus contents  FOLLOWUP CARE:  The patient will be discharged home once awake and alert.  The patient will follow up in my office in approximately 1 week.  Ascencion Dike 05/08/2015 12:26 PM

## 2015-05-08 NOTE — Anesthesia Preprocedure Evaluation (Addendum)
Anesthesia Evaluation  Patient identified by MRN, date of birth, ID band Patient awake    Reviewed: Allergy & Precautions, NPO status , Patient's Chart, lab work & pertinent test results  Airway Mallampati: II   Neck ROM: full    Dental   Pulmonary Current Smoker,  breath sounds clear to auscultation        Cardiovascular hypertension, Rhythm:regular Rate:Normal     Neuro/Psych    GI/Hepatic   Endo/Other  obese  Renal/GU      Musculoskeletal  (+) Arthritis -,   Abdominal   Peds  Hematology   Anesthesia Other Findings   Reproductive/Obstetrics                             Anesthesia Physical Anesthesia Plan  ASA: II  Anesthesia Plan: General   Post-op Pain Management:    Induction: Intravenous  Airway Management Planned: Oral ETT  Additional Equipment:   Intra-op Plan:   Post-operative Plan: Extubation in OR  Informed Consent: I have reviewed the patients History and Physical, chart, labs and discussed the procedure including the risks, benefits and alternatives for the proposed anesthesia with the patient or authorized representative who has indicated his/her understanding and acceptance.     Plan Discussed with: CRNA, Anesthesiologist and Surgeon  Anesthesia Plan Comments:         Anesthesia Quick Evaluation  

## 2015-05-08 NOTE — Anesthesia Procedure Notes (Signed)
Procedure Name: Intubation Date/Time: 05/08/2015 9:33 AM Performed by: Lieutenant Diego Pre-anesthesia Checklist: Patient identified, Emergency Drugs available, Suction available and Patient being monitored Patient Re-evaluated:Patient Re-evaluated prior to inductionOxygen Delivery Method: Circle System Utilized Preoxygenation: Pre-oxygenation with 100% oxygen Intubation Type: IV induction Ventilation: Mask ventilation without difficulty Laryngoscope Size: Miller and 2 Grade View: Grade I Tube type: Oral Tube size: 7.0 mm Number of attempts: 1 Airway Equipment and Method: Stylet and Oral airway Placement Confirmation: ETT inserted through vocal cords under direct vision,  positive ETCO2 and breath sounds checked- equal and bilateral Secured at: 24 cm Tube secured with: Tape Dental Injury: Teeth and Oropharynx as per pre-operative assessment

## 2015-05-08 NOTE — Anesthesia Postprocedure Evaluation (Signed)
Anesthesia Post Note  Patient: Samuel Mendez  Procedure(s) Performed: Procedure(s) (LRB): ENDOSCOPIC BILATERAL  ETHMOIDECTOMY, BILATERAL MAXILLARY ANTROSTOMY, BILATERAL SPHENOIDECTOMY, BILATERAL  FRONTAL RECESS EXPLORATION WITH FUSION NAVIGATION (Bilateral) NASAL SEPTOPLASTY (N/A)  Patient location during evaluation: PACU Anesthesia Type: General Level of consciousness: awake and alert Pain management: pain level controlled Vital Signs Assessment: post-procedure vital signs reviewed and stable Respiratory status: spontaneous breathing, nonlabored ventilation and respiratory function stable Cardiovascular status: blood pressure returned to baseline and stable Postop Assessment: no signs of nausea or vomiting Anesthetic complications: no    Last Vitals:  Filed Vitals:   05/08/15 1345 05/08/15 1430  BP: 169/93 185/96  Pulse: 75 79  Temp:  36.5 C  Resp: 19 18    Last Pain:  Filed Vitals:   05/08/15 1432  PainSc: 4                  Zenaida Deed

## 2015-05-08 NOTE — Transfer of Care (Signed)
Immediate Anesthesia Transfer of Care Note  Patient: Samuel Mendez  Procedure(s) Performed: Procedure(s): ENDOSCOPIC BILATERAL  ETHMOIDECTOMY, BILATERAL MAXILLARY ANTROSTOMY, BILATERAL SPHENOIDECTOMY, BILATERAL  FRONTAL RECESS EXPLORATION WITH FUSION NAVIGATION (Bilateral) NASAL SEPTOPLASTY (N/A)  Patient Location: PACU  Anesthesia Type:General  Level of Consciousness: awake and alert   Airway & Oxygen Therapy: Patient Spontanous Breathing and Patient connected to face mask oxygen  Post-op Assessment: Report given to RN and Post -op Vital signs reviewed and stable  Post vital signs: Reviewed and stable  Last Vitals:  Filed Vitals:   05/08/15 0820 05/08/15 0858  BP: 139/71   Pulse: 52   Temp: 36.6 C 36.6 C  Resp: 18     Complications: No apparent anesthesia complications

## 2015-05-10 ENCOUNTER — Encounter (HOSPITAL_BASED_OUTPATIENT_CLINIC_OR_DEPARTMENT_OTHER): Payer: Self-pay | Admitting: Otolaryngology

## 2015-05-11 ENCOUNTER — Ambulatory Visit (INDEPENDENT_AMBULATORY_CARE_PROVIDER_SITE_OTHER): Payer: PPO | Admitting: Otolaryngology

## 2015-05-11 DIAGNOSIS — J321 Chronic frontal sinusitis: Secondary | ICD-10-CM | POA: Diagnosis not present

## 2015-05-11 DIAGNOSIS — J322 Chronic ethmoidal sinusitis: Secondary | ICD-10-CM | POA: Diagnosis not present

## 2015-05-11 DIAGNOSIS — J320 Chronic maxillary sinusitis: Secondary | ICD-10-CM | POA: Diagnosis not present

## 2015-05-11 DIAGNOSIS — J323 Chronic sphenoidal sinusitis: Secondary | ICD-10-CM | POA: Diagnosis not present

## 2015-05-25 ENCOUNTER — Ambulatory Visit (INDEPENDENT_AMBULATORY_CARE_PROVIDER_SITE_OTHER): Payer: PPO | Admitting: Otolaryngology

## 2015-05-25 DIAGNOSIS — J322 Chronic ethmoidal sinusitis: Secondary | ICD-10-CM | POA: Diagnosis not present

## 2015-05-25 DIAGNOSIS — J321 Chronic frontal sinusitis: Secondary | ICD-10-CM | POA: Diagnosis not present

## 2015-05-25 DIAGNOSIS — J323 Chronic sphenoidal sinusitis: Secondary | ICD-10-CM

## 2015-05-25 DIAGNOSIS — J320 Chronic maxillary sinusitis: Secondary | ICD-10-CM | POA: Diagnosis not present

## 2015-06-29 ENCOUNTER — Ambulatory Visit (INDEPENDENT_AMBULATORY_CARE_PROVIDER_SITE_OTHER): Payer: PPO | Admitting: Otolaryngology

## 2015-06-29 DIAGNOSIS — J321 Chronic frontal sinusitis: Secondary | ICD-10-CM

## 2015-06-29 DIAGNOSIS — J322 Chronic ethmoidal sinusitis: Secondary | ICD-10-CM

## 2015-06-29 DIAGNOSIS — J323 Chronic sphenoidal sinusitis: Secondary | ICD-10-CM

## 2015-06-29 DIAGNOSIS — J320 Chronic maxillary sinusitis: Secondary | ICD-10-CM | POA: Diagnosis not present

## 2015-07-11 ENCOUNTER — Other Ambulatory Visit: Payer: Self-pay | Admitting: Family Medicine

## 2015-09-06 ENCOUNTER — Other Ambulatory Visit (INDEPENDENT_AMBULATORY_CARE_PROVIDER_SITE_OTHER): Payer: PPO

## 2015-09-06 DIAGNOSIS — Z1159 Encounter for screening for other viral diseases: Secondary | ICD-10-CM

## 2015-09-06 DIAGNOSIS — Z0000 Encounter for general adult medical examination without abnormal findings: Secondary | ICD-10-CM | POA: Diagnosis not present

## 2015-09-06 LAB — LIPID PANEL
CHOLESTEROL: 176 mg/dL (ref 0–200)
HDL: 40.3 mg/dL (ref >39.00)
LDL CALC: 117 mg/dL — AB (ref 0–99)
NonHDL: 135.47
Total CHOL/HDL Ratio: 4
Triglycerides: 92 mg/dL (ref 0.0–149.0)
VLDL: 18.4 mg/dL (ref 0.0–40.0)

## 2015-09-06 LAB — TSH: TSH: 2.51 u[IU]/mL (ref 0.35–4.50)

## 2015-09-06 LAB — BASIC METABOLIC PANEL
BUN: 22 mg/dL (ref 6–23)
CALCIUM: 9.1 mg/dL (ref 8.4–10.5)
CO2: 28 meq/L (ref 19–32)
CREATININE: 1.19 mg/dL (ref 0.40–1.50)
Chloride: 109 mEq/L (ref 96–112)
GFR: 76.77 mL/min (ref >60.00)
Glucose, Bld: 104 mg/dL — ABNORMAL HIGH (ref 70–99)
Potassium: 4 mEq/L (ref 3.5–5.1)
SODIUM: 141 meq/L (ref 135–145)

## 2015-09-06 LAB — HEPATIC FUNCTION PANEL
ALK PHOS: 48 U/L (ref 39–117)
ALT: 12 U/L (ref 0–53)
AST: 16 U/L (ref 0–37)
Albumin: 4.1 g/dL (ref 3.5–5.2)
BILIRUBIN DIRECT: 0.2 mg/dL (ref 0.0–0.3)
TOTAL PROTEIN: 6.5 g/dL (ref 6.0–8.3)
Total Bilirubin: 1.1 mg/dL (ref 0.2–1.2)

## 2015-09-06 LAB — CBC WITH DIFFERENTIAL/PLATELET
BASOS ABS: 0 10*3/uL (ref 0.0–0.1)
Basophils Relative: 0.7 % (ref 0.0–3.0)
EOS ABS: 0.1 10*3/uL (ref 0.0–0.7)
Eosinophils Relative: 1.1 % (ref 0.0–5.0)
HEMATOCRIT: 38.3 % — AB (ref 39.0–52.0)
Hemoglobin: 12.7 g/dL — ABNORMAL LOW (ref 13.0–17.0)
LYMPHS PCT: 15.6 % (ref 12.0–46.0)
Lymphs Abs: 1.1 10*3/uL (ref 0.7–4.0)
MCHC: 33.1 g/dL (ref 30.0–36.0)
MCV: 88.1 fl (ref 78.0–100.0)
MONOS PCT: 9.1 % (ref 3.0–12.0)
Monocytes Absolute: 0.6 10*3/uL (ref 0.1–1.0)
NEUTROS ABS: 5.2 10*3/uL (ref 1.4–7.7)
Neutrophils Relative %: 73.5 % (ref 43.0–77.0)
PLATELETS: 189 10*3/uL (ref 150.0–400.0)
RBC: 4.35 Mil/uL (ref 4.22–5.81)
RDW: 13.4 % (ref 11.5–15.5)
WBC: 7 10*3/uL (ref 4.0–10.5)

## 2015-09-06 LAB — POC URINALSYSI DIPSTICK (AUTOMATED)
Bilirubin, UA: NEGATIVE
Blood, UA: NEGATIVE
GLUCOSE UA: NEGATIVE
KETONES UA: NEGATIVE
LEUKOCYTES UA: NEGATIVE
Nitrite, UA: NEGATIVE
PROTEIN UA: NEGATIVE
Spec Grav, UA: 1.02
UROBILINOGEN UA: 1
pH, UA: 6

## 2015-09-06 LAB — PSA: PSA: 0.75 ng/mL (ref 0.10–4.00)

## 2015-09-07 LAB — HEPATITIS C ANTIBODY: HCV AB: NEGATIVE

## 2015-09-11 ENCOUNTER — Encounter: Payer: Self-pay | Admitting: Family Medicine

## 2015-09-11 ENCOUNTER — Ambulatory Visit (INDEPENDENT_AMBULATORY_CARE_PROVIDER_SITE_OTHER): Payer: PPO | Admitting: Family Medicine

## 2015-09-11 VITALS — BP 110/72 | HR 56 | Temp 97.8°F | Ht 72.0 in | Wt 216.0 lb

## 2015-09-11 DIAGNOSIS — D649 Anemia, unspecified: Secondary | ICD-10-CM | POA: Diagnosis not present

## 2015-09-11 DIAGNOSIS — Z0001 Encounter for general adult medical examination with abnormal findings: Secondary | ICD-10-CM

## 2015-09-11 HISTORY — DX: Anemia, unspecified: D64.9

## 2015-09-11 MED ORDER — ATORVASTATIN CALCIUM 20 MG PO TABS: 20.0000 mg | ORAL_TABLET | Freq: Every day | ORAL | Status: DC

## 2015-09-11 MED ORDER — ZOSTER VACCINE LIVE 19400 UNT/0.65ML ~~LOC~~ SUSR: 0.6500 mL | Freq: Once | SUBCUTANEOUS | Status: DC

## 2015-09-11 NOTE — Patient Instructions (Addendum)
Call your insurace about the shingles shot to see if it is covered or how much it would cost and where is cheaper (here or pharmacy).  If you want to receive here, call for nurse visit. Let us know when you get it through mychart.   Restart cholesterol medicine  Slight increased risk for diabetes- cut down on the sugars  See me if blood pressure gets above 140/90. Check at least a few times a month.   Might want to slow down on the platelet given due to anemia

## 2015-09-11 NOTE — Assessment & Plan Note (Signed)
LDL under 70 on lipitor 20mg . Close to 120 -130 off meds. Encouraged to restart medication.  Lab Results  Component Value Date   CHOL 176 09/06/2015   HDL 40.30 09/06/2015   LDLCALC 117* 09/06/2015   LDLDIRECT 99.0 08/22/2014   TRIG 92.0 09/06/2015   CHOLHDL 4 09/06/2015

## 2015-09-11 NOTE — Progress Notes (Signed)
Phone: [phone]  Subjective:  Patient presents today for their annual physical. Chief complaint-noted.   See problem oriented charting- ROS- full  review of systems was completed and negative including No chest pain or shortness of breath. No headache or blurry vision. No dizziness.   The following were reviewed and entered/updated in epic: Past Medical History  Diagnosis Date  . Hypertension   . Hypercholesteremia   . Obesity   . Diverticulosis of colon   . Hx of colonic polyps   . Prostatitis   . DJD (degenerative joint disease)   . RENAL CALCULUS 04/22/2007    x3,  doctor released him  . Deviated septum    Patient Active Problem List   Diagnosis Date Noted  . BPH (benign prostatic hyperplasia) 05/04/2014    Priority: Medium  . Anxiety and depression 04/30/2012    Priority: Medium  . HYPERCHOLESTEROLEMIA 04/24/2007    Priority: Medium  . Essential hypertension 04/22/2007    Priority: Medium  . Anemia, unspecified 09/11/2015    Priority: Low  . Obesity 05/01/2011    Priority: Low  . COLONIC POLYPS 04/24/2007    Priority: Low  . Osteoarthritis 04/22/2007    Priority: Low   Past Surgical History  Procedure Laterality Date  . Colles fracture left wrist after fall  11/2007  . Anal fistula repair      in 90s-outpatient repair  . Sinus endo with fusion Bilateral 05/08/2015    Procedure: ENDOSCOPIC BILATERAL  ETHMOIDECTOMY, BILATERAL MAXILLARY ANTROSTOMY, BILATERAL SPHENOIDECTOMY, BILATERAL  FRONTAL RECESS EXPLORATION WITH FUSION NAVIGATION;  Surgeon: Leta Baptist, MD;  Location: [facility];  Service: ENT;  Laterality: Bilateral;  . Septoplasty N/A 05/08/2015    Procedure: NASAL SEPTOPLASTY;  Surgeon: Leta Baptist, MD;  Location: [facility];  Service: ENT;  Laterality: N/A;    Family History  Problem Relation Age of Onset  . Colon cancer Neg Hx   . Pancreatic cancer Father   . Hypertension Mother   . Aneurysm Mother     brain died  at 63    Medications- reviewed and updated Current Outpatient Prescriptions  Medication Sig Dispense Refill  . losartan-hydrochlorothiazide (HYZAAR) 100-12.5 MG tablet TAKE 1 TABLET BY MOUTH DAILY 30 tablet 5  . Multiple Vitamins-Minerals (MULTIVITAMIN & MINERAL PO) Take 1 tablet by mouth daily.      . Omega 3 1000 MG CAPS Take 1 capsule by mouth daily.    . saw palmetto 500 MG capsule Take 500 mg by mouth daily.       Allergies-reviewed and updated No Known Allergies  Social History   Social History  . Marital Status: Married    Spouse Name: cora x 40 yrs  . Number of Children: 3  . Years of Education: N/A   Occupational History  . 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 Smoking status: Current Some Day Smoker    Types: Cigars  . Smokeless tobacco: Never Used     Comment: 1-2 per week of cigars only after retirement  . Alcohol Use: No  . Drug Use: No  . Sexual Activity: Not Asked   Other Topics Concern  . None   Social History Narrative   Married (wife pt elsewhere). 3 children, 2 from current wife. 4 grandkids. No greatgrandkids.       Farms-raises cattle. Retired from truck driving.       Hobbies: works  on old cars ([employer]), travel    Objective: BP 110/72 mmHg  Pulse 56  Temp(Src) 97.8 F (36.6 C) (Oral)  Ht 6' (1.829 m)  Wt 216 lb (97.977 kg)  BMI 29.29 kg/m2  SpO2 97% Gen: NAD, resting comfortably HEENT: Mucous membranes are moist. Oropharynx normal Neck: no thyromegaly CV: RRR no murmurs rubs or gallops Lungs: CTAB no crackles, wheeze, rhonchi Abdomen: soft/nontender/nondistended/normal bowel sounds. No rebound or guarding.  Ext: no edema Skin: warm, dry Neuro: grossly normal, moves all extremities, PERRLA  Rectal deferred- no further prostate cancer screening  Assessment/Plan:  75 y.o. male presenting for annual physical.  Health Maintenance counseling: 1. Anticipatory  guidance: Patient counseled regarding regular dental exams, eye exams, wearing seatbelts.  2. Risk factor reduction:  Advised patient of need for regular exercise and diet rich and fruits and vegetables to reduce risk of heart attack and stroke. Farms and is very active. Lost 10 lbs since last year, 8 lbs since last visit. Trying to eat healthy.  3. Immunizations/screenings/ancillary studies Immunization History  Administered Date(s) Administered  . Influenza Split 03/08/2011, 03/24/2012  . Influenza Whole 04/27/2010  . Influenza, High Dose Seasonal PF 03/07/2014  . Influenza,inj,Quad PF,36+ Mos 01/26/2015  . Pneumococcal Polysaccharide-23 04/24/2007  . Td 04/24/2007   Health Maintenance Due  Topic Date Due  . ZOSTAVAX - printed and to take to pharmacy 04/14/2001   4. Prostate cancer screening- low risk, we discussed no longer testing. Tell lab next year- no PSA desired.   Lab Results  Component Value Date   PSA 0.75 09/06/2015   PSA 0.71 05/04/2014   PSA 0.61 04/30/2012   5. Colon cancer screening - 05/14/13 with 5 year repeat  Status of chronic or acute concerns  Hypertension controlled on losartan-hctz 100-12.5mg   BPH- Low risk prostate cancer based on PSA trend. Nocturia 0-1x a night on saw palmetto.   Anemia is due to giving platelets so frequently- we discussed slowing this down.   HYPERCHOLESTEROLEMIA LDL under 70 on lipitor 20mg . Close to 120 -130 off meds. Encouraged to restart medication.  Lab Results  Component Value Date   CHOL 176 09/06/2015   HDL 40.30 09/06/2015   LDLCALC 117* 09/06/2015   LDLDIRECT 99.0 08/22/2014   TRIG 92.0 09/06/2015   CHOLHDL 4 09/06/2015     Return in about 1 year (around 09/10/2016) for physical. Return precautions advised.   Meds ordered this encounter  Medications  . Zoster Vaccine Live, PF, (ZOSTAVAX) 52841 UNT/0.65ML injection    Sig: Inject 19,400 Units into the skin once.    Dispense:  1 each    Refill:  0  . atorvastatin  (LIPITOR) 20 MG tablet    Sig: Take 1 tablet (20 mg total) by mouth daily.    Dispense:  90 tablet    Refill:  3    Garret Reddish, MD

## 2015-09-14 ENCOUNTER — Encounter: Payer: Self-pay | Admitting: Family Medicine

## 2015-12-26 IMAGING — CT CT MAXILLOFACIAL W/O CM
3 of 4 series · 14 of 47 positions shown, 16 images · non-contrast
Comparison: None.

CLINICAL DATA: 73-year-old male with chronic sinus infections for 3
years. Maxillary sinusitis. Initial encounter.

EXAM:
CT MAXILLOFACIAL WITHOUT CONTRAST
TECHNIQUE: Multidetector CT imaging of the maxillofacial structures was
performed. Multiplanar CT image reconstructions were also generated.
A small metallic BB was placed on the right temple in order to
reliably differentiate right from left.

[Series 3: stealth sinus sinus alg 1.0 · axial · 0.35mm/px · z∈[+1327,+1444]mm · 8 of 137 slices shown, 10 images]
[im 10/137  brain]
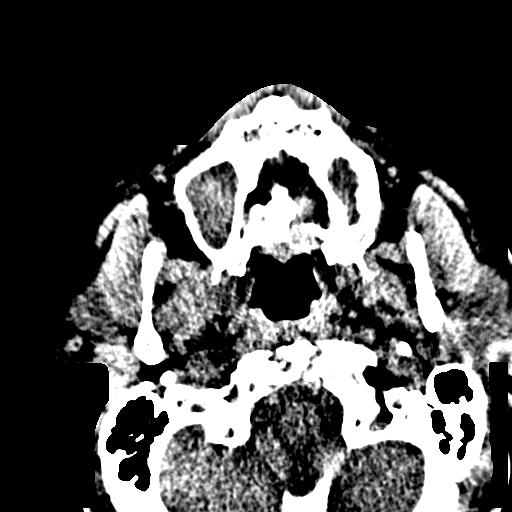
[im 10/137  bone]
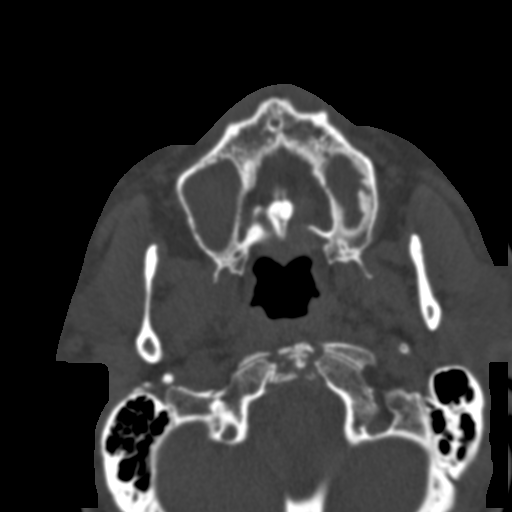
[im 30/137  bone]
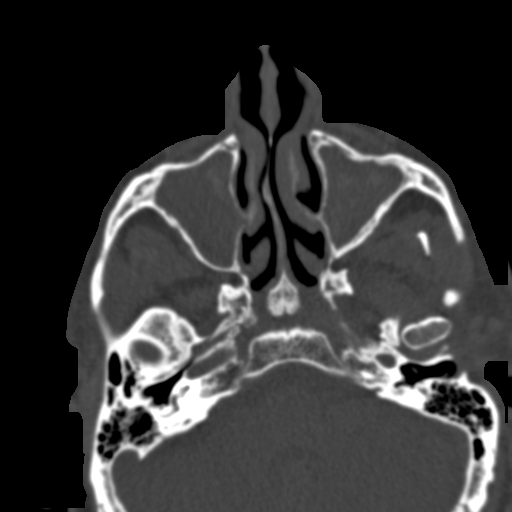
[im 49/137  bone]
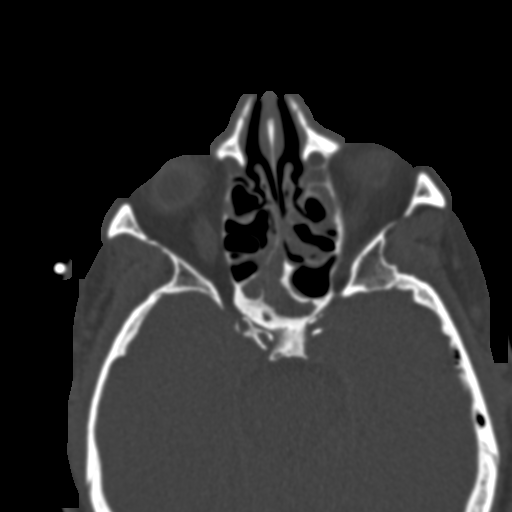
[im 59/137  bone]
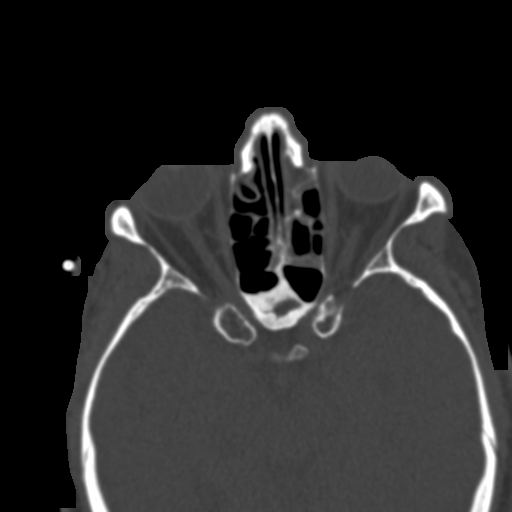
[im 78/137  brain]
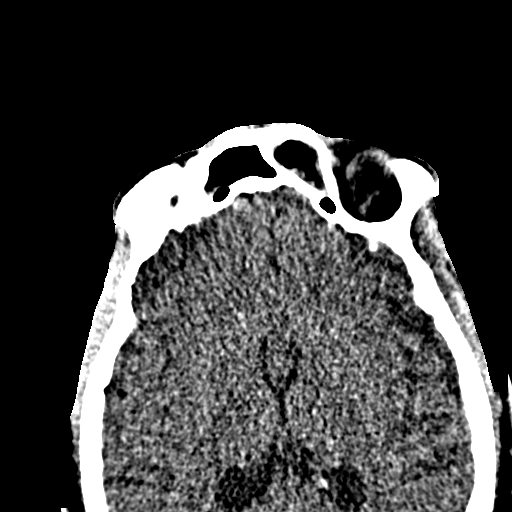
[im 78/137  bone]
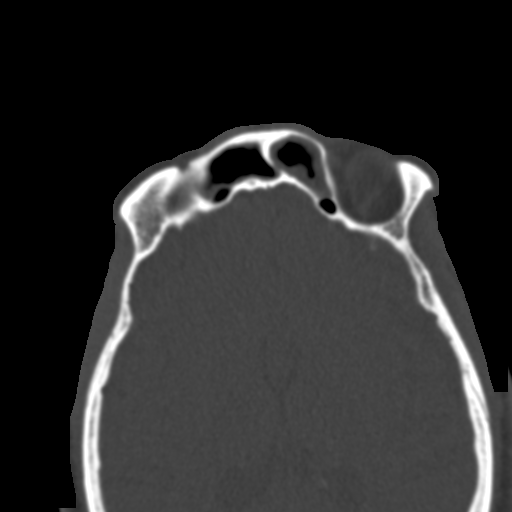
[im 88/137  bone]
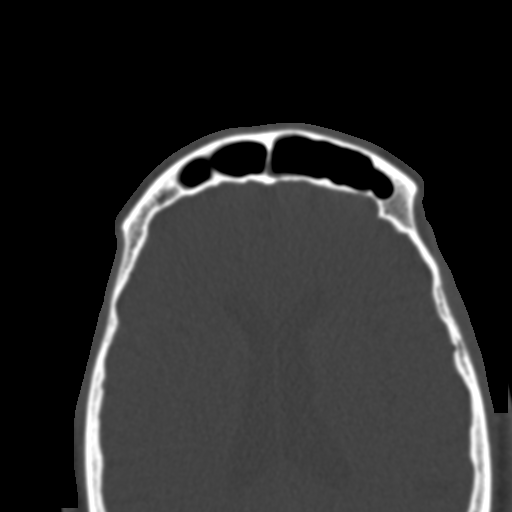
[im 107/137  bone]
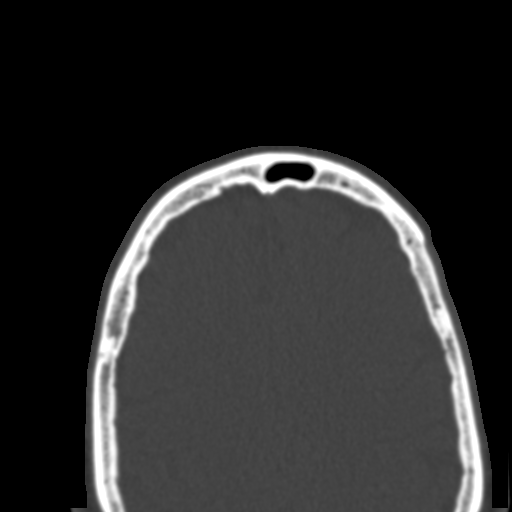
[im 127/137  bone]
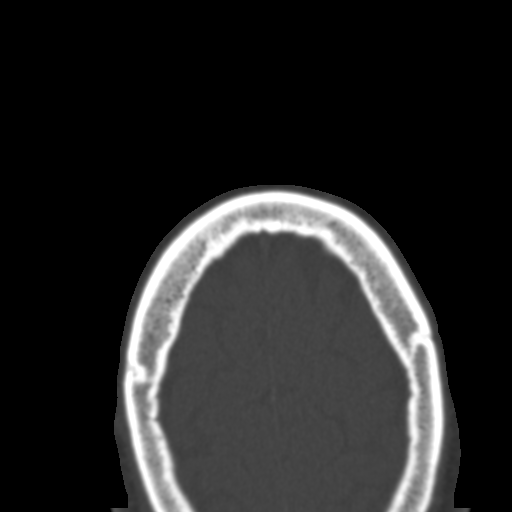

[Series 4: stealth sinus coro 2.0 · coronal · 0.26mm/px · 3 of 84 slices shown]
[im 28/84  bone]
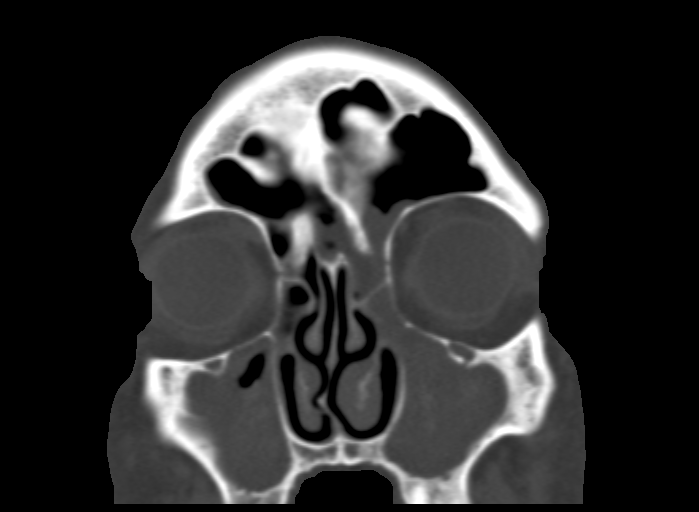
[im 37/84  bone]
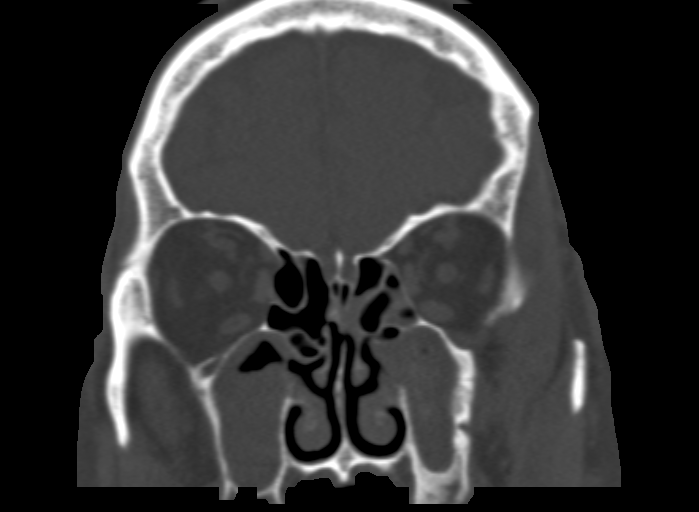
[im 47/84  bone]
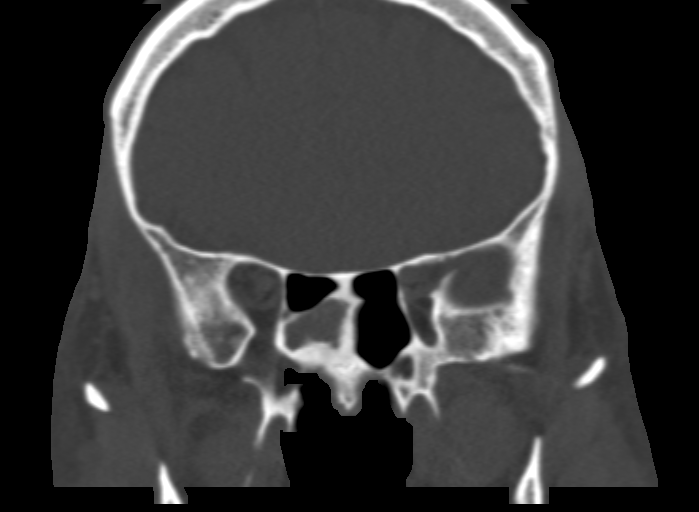

[Series 5: stealth sinus sag 2.0 · sagittal · 0.27mm/px · 3 of 86 slices shown]
[im 29/86  bone]
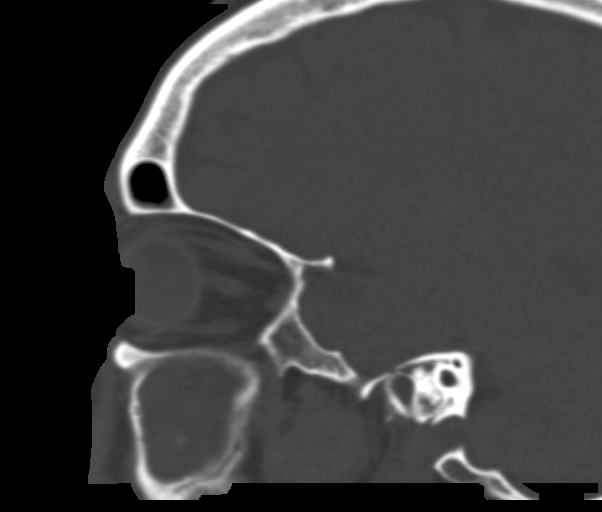
[im 43/86  bone]
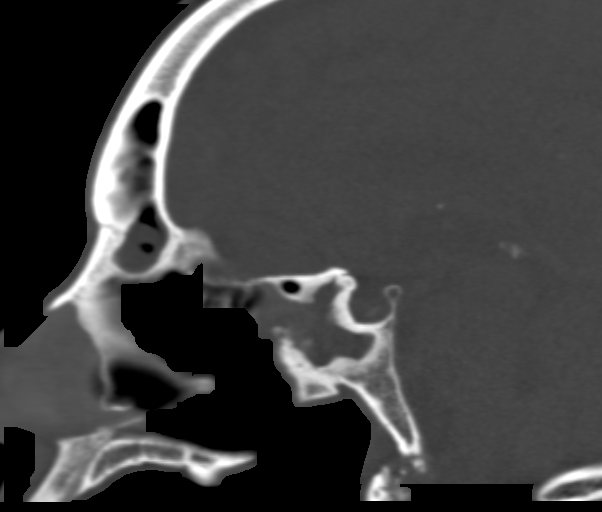
[im 57/86  bone]
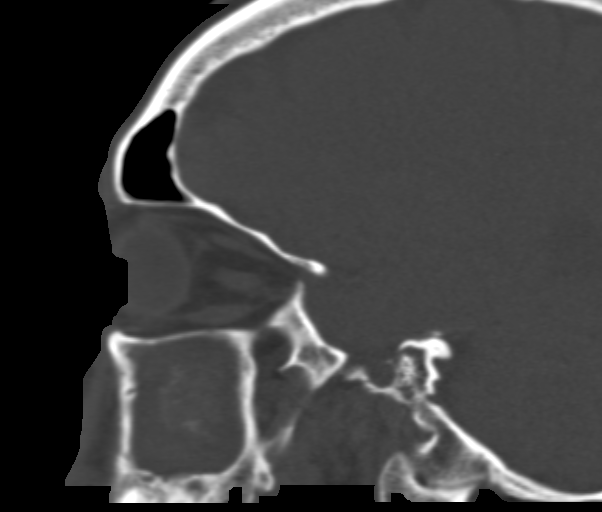

[14 of 47 positions shown; findings below may reference images not displayed]

FINDINGS: Calcified atherosclerosis at the skull base. Negative visualized
noncontrast brain parenchyma. Visualized scalp soft tissues are
within normal limits. Negative noncontrast orbit soft tissues.
Negative noncontrast deep soft tissue spaces of the face.

Bilateral mastoids are clear. Right tympanic cavity and EAC are
clear. There is partial opacification of the left EAC and thickening
of the left tympanic membrane (series 3, image 34).

Widespread paranasal sinus mucoperiosteal thickening, most
pronounced in the sphenoid and maxillary sinuses which are
completely or near completely opacified. There is hyperdense
material (may be partially calcified on the right) in both maxillary
sinuses.

There is left greater than right ethmoid air cell mucosal thickening
and opacification. The left frontoethmoidal recess is opacified.
There is left greater than right inferior frontal sinus
opacification and mucosal thickening elsewhere.

The left OMC is opacified. The right is narrowed but appears to
remain patent. See coronal image 31. Rightward nasal septal
deviation. Fairly symmetric appearing nasal cavity mucosal
thickening.

Left maxillary posterior molar periapical lucency (coronal image
34). This does not appear to directly communicate with the left
maxillary sinus. No acute osseous abnormality identified.
IMPRESSION: 1. Chronic paranasal sinusitis most pronounced in the maxillary and
sphenoid sinuses. Increased density in both maxillary sinuses,
consider NON-invasive fungal sinusitis. Posterior left maxillary
molar disease, thus consider also odontogenic inflammation.
2. Ethmoid and frontal sinus mucosal thickening and opacification
greater on the left. Left OMC appears obstructed. The right is
narrowed but may remain patent.
3. Left EAC opacification with evidence of left tympanic membrane
thickening.

## 2016-01-03 ENCOUNTER — Other Ambulatory Visit: Payer: Self-pay | Admitting: Family Medicine

## 2016-01-04 ENCOUNTER — Ambulatory Visit (INDEPENDENT_AMBULATORY_CARE_PROVIDER_SITE_OTHER): Payer: PPO | Admitting: Otolaryngology

## 2016-03-07 ENCOUNTER — Encounter: Payer: Self-pay | Admitting: Gastroenterology

## 2016-03-13 ENCOUNTER — Encounter: Payer: Self-pay | Admitting: Internal Medicine

## 2016-03-21 ENCOUNTER — Encounter: Payer: Self-pay | Admitting: Internal Medicine

## 2016-05-23 ENCOUNTER — Ambulatory Visit (AMBULATORY_SURGERY_CENTER): Payer: Self-pay | Admitting: *Deleted

## 2016-05-23 DIAGNOSIS — Z86010 Personal history of colonic polyps: Secondary | ICD-10-CM

## 2016-05-23 MED ORDER — NA SULFATE-K SULFATE-MG SULF 17.5-3.13-1.6 GM/177ML PO SOLN: ORAL | 0 refills | Status: DC

## 2016-05-23 NOTE — Progress Notes (Signed)
Pt denies allergies to eggs or soy products. Denies difficulty with sedation or anesthesia. Denies any diet or weight loss medications. Denies use of supplemental oxygen.  Patient states that he doesn't need the video.

## 2016-06-03 DIAGNOSIS — H2510 Age-related nuclear cataract, unspecified eye: Secondary | ICD-10-CM | POA: Diagnosis not present

## 2016-06-03 DIAGNOSIS — Z0100 Encounter for examination of eyes and vision without abnormal findings: Secondary | ICD-10-CM | POA: Diagnosis not present

## 2016-06-03 DIAGNOSIS — I10 Essential (primary) hypertension: Secondary | ICD-10-CM | POA: Diagnosis not present

## 2016-06-03 DIAGNOSIS — H5200 Hypermetropia, unspecified eye: Secondary | ICD-10-CM | POA: Diagnosis not present

## 2016-06-06 ENCOUNTER — Ambulatory Visit (AMBULATORY_SURGERY_CENTER): Payer: Medicare HMO | Admitting: Internal Medicine

## 2016-06-06 ENCOUNTER — Encounter: Payer: Self-pay | Admitting: Internal Medicine

## 2016-06-06 VITALS — BP 122/64 | HR 47 | Temp 97.8°F | Resp 18 | Ht 72.0 in | Wt 216.0 lb

## 2016-06-06 DIAGNOSIS — Z86010 Personal history of colonic polyps: Secondary | ICD-10-CM

## 2016-06-06 DIAGNOSIS — E669 Obesity, unspecified: Secondary | ICD-10-CM | POA: Diagnosis not present

## 2016-06-06 DIAGNOSIS — D122 Benign neoplasm of ascending colon: Secondary | ICD-10-CM | POA: Diagnosis not present

## 2016-06-06 DIAGNOSIS — I10 Essential (primary) hypertension: Secondary | ICD-10-CM | POA: Diagnosis not present

## 2016-06-06 DIAGNOSIS — K635 Polyp of colon: Secondary | ICD-10-CM | POA: Diagnosis not present

## 2016-06-06 DIAGNOSIS — D125 Benign neoplasm of sigmoid colon: Secondary | ICD-10-CM

## 2016-06-06 MED ORDER — SODIUM CHLORIDE 0.9 % IV SOLN: 500.0000 mL | INTRAVENOUS | Status: DC

## 2016-06-06 NOTE — Patient Instructions (Signed)
YOU HAD AN ENDOSCOPIC PROCEDURE TODAY AT THE [facility]:   Refer to the procedure report that was given to you for any specific questions about what was found during the examination.  If the procedure report does not answer your questions, please call your gastroenterologist to clarify.  If you requested that your care partner not be given the details of your procedure findings, then the procedure report has been included in a sealed envelope for you to review at your convenience later.  YOU SHOULD EXPECT: Some feelings of bloating in the abdomen. Passage of more gas than usual.  Walking can help get rid of the air that was put into your GI tract during the procedure and reduce the bloating. If you had a lower endoscopy (such as a colonoscopy or flexible sigmoidoscopy) you may notice spotting of blood in your stool or on the toilet paper. If you underwent a bowel prep for your procedure, you may not have a normal bowel movement for a few days.  Please Note:  You might notice some irritation and congestion in your nose or some drainage.  This is from the oxygen used during your procedure.  There is no need for concern and it should clear up in a day or so.  SYMPTOMS TO REPORT IMMEDIATELY:   Following lower endoscopy (colonoscopy or flexible sigmoidoscopy):  Excessive amounts of blood in the stool  Significant tenderness or worsening of abdominal pains  Swelling of the abdomen that is new, acute  Fever of 100F or higher  For urgent or emergent issues, a gastroenterologist can be reached at any hour by calling [phone].   DIET:  We do recommend a small meal at first, but then you may proceed to your regular diet.  Drink plenty of fluids but you should avoid alcoholic beverages for 24 hours.  MEDICATIONS:  Continue present medications.  ACTIVITY:  You should plan to take it easy for the rest of today and you should NOT DRIVE or use heavy machinery until tomorrow (because of the  sedation medicines used during the test).    FOLLOW UP: Our staff will call the number listed on your records the next business day following your procedure to check on you and address any questions or concerns that you may have regarding the information given to you following your procedure. If we do not reach you, we will leave a message.  However, if you are feeling well and you are not experiencing any problems, there is no need to return our call.  We will assume that you have returned to your regular daily activities without incident.  If any biopsies were taken you will be contacted by phone or by letter within the next 1-3 weeks.  Please call us at [phone] if you have not heard about the biopsies in 3 weeks.   Thank you for allowing us to provide for your healthcare needs today.   SIGNATURES/CONFIDENTIALITY: You and/or your care partner have signed paperwork which will be entered into your electronic medical record.  These signatures attest to the fact that that the information above on your After Visit Summary has been reviewed and is understood.  Full responsibility of the confidentiality of this discharge information lies with you and/or your care-partner. 

## 2016-06-06 NOTE — Progress Notes (Signed)
Report to PACU, RN, vss, BBS= Clear.  

## 2016-06-06 NOTE — Progress Notes (Signed)
Called to room to assist during endoscopic procedure.  Patient ID and intended procedure confirmed with present staff. Received instructions for my participation in the procedure from the performing physician.  

## 2016-06-06 NOTE — Op Note (Signed)
[facility] Patient Name: Samuel Mendez Procedure Date: 06/06/2016 8:52 AM MRN: [id number] Endoscopist: Docia Chuck. Henrene Pastor , MD Age: 76 Referring MD:  Date of Birth: Jul 02, 1940 Gender: Male Account #: [id number] Procedure:                Colonoscopy, with cold snare polypectomy X2 Indications:              High risk colon cancer surveillance: Personal                            history of multiple (3 or more) adenomas. Previous                            examinations 2004, 2007, and 2013 Medicines:                Monitored Anesthesia Care Procedure:                Pre-Anesthesia Assessment:                           - Prior to the procedure, a History and Physical                            was performed, and patient medications and                            allergies were reviewed. The patient's tolerance of                            previous anesthesia was also reviewed. The risks                            and benefits of the procedure and the sedation                            options and risks were discussed with the patient.                            All questions were answered, and informed consent                            was obtained. Prior Anticoagulants: The patient has                            taken no previous anticoagulant or antiplatelet                            agents. ASA Grade Assessment: II - A patient with                            mild systemic disease. After reviewing the risks                            and benefits, the patient was deemed in  satisfactory condition to undergo the procedure.                           After obtaining informed consent, the colonoscope                            was passed under direct vision. Throughout the                            procedure, the patient's blood pressure, pulse, and                            oxygen saturations were monitored continuously. The    Model CF-HQ190L [id number]) scope was introduced                            through the anus and advanced to the the cecum,                            identified by appendiceal orifice and ileocecal                            valve. The ileocecal valve, appendiceal orifice,                            and rectum were photographed. The quality of the                            bowel preparation was excellent. The colonoscopy                            was performed without difficulty. The patient                            tolerated the procedure well. The bowel preparation                            used was SUPREP. Scope In: 9:13:56 AM Scope Out: 9:32:26 AM Scope Withdrawal Time: 0 hours 15 minutes 51 seconds  Total Procedure Duration: 0 hours 18 minutes 30 seconds  Findings:                 Two polyps were found in the sigmoid colon and                            ascending colon. The polyps were 1 to 2 mm in size.                            These polyps were removed with a cold snare.                            Resection and retrieval were complete.  Multiple diverticula were found in the sigmoid                            colon.                           Internal hemorrhoids were found during retroflexion.                           The exam was otherwise without abnormality on                            direct and retroflexion views. Complications:            No immediate complications. Estimated blood loss:                            None. Estimated Blood Loss:     Estimated blood loss: none. Impression:               - Two 1 to 2 mm polyps in the sigmoid colon and in                            the ascending colon, removed with a cold snare.                            Resected and retrieved.                           - Diverticulosis in the sigmoid colon.                           - Internal hemorrhoids.                           - The examination was  otherwise normal on direct                            and retroflexion views. Recommendation:           - Repeat colonoscopy is not recommended for                            surveillance.                           - Patient has a contact number available for                            emergencies. The signs and symptoms of potential                            delayed complications were discussed with the                            patient. Return to normal activities tomorrow.  Written discharge instructions were provided to the                            patient.                           - Resume previous diet.                           - Continue present medications.                           - Await pathology results. Docia Chuck. Henrene Pastor, MD 06/06/2016 9:37:27 AM This report has been signed electronically.

## 2016-06-07 ENCOUNTER — Telehealth: Payer: Self-pay

## 2016-06-07 NOTE — Telephone Encounter (Signed)
  Follow up Call-  Call back number 06/06/2016  Post procedure Call Back phone  # [phone]  Permission to leave phone message Yes  Some recent data might be hidden    Patient was called for follow up after his procedure on 06/06/2016. No answer at the number given for follow up phone call. A message was left on the answering machine.

## 2016-06-07 NOTE — Telephone Encounter (Signed)
  Follow up Call-  Call back number 06/06/2016  Post procedure Call Back phone  # [phone]  Permission to leave phone message Yes  Some recent data might be hidden    Patient was called for follow up after her procedure on 06/06/2016. No answer at the number given for follow up phone call. I was not able to leave a message.

## 2016-06-11 ENCOUNTER — Encounter: Payer: Self-pay | Admitting: Internal Medicine

## 2016-06-13 ENCOUNTER — Encounter: Payer: Self-pay | Admitting: Family Medicine

## 2016-06-13 DIAGNOSIS — Z86010 Personal history of colonic polyps: Secondary | ICD-10-CM

## 2016-06-13 DIAGNOSIS — Z860101 Personal history of adenomatous and serrated colon polyps: Secondary | ICD-10-CM

## 2016-06-13 HISTORY — DX: Personal history of colonic polyps: Z86.010

## 2016-06-13 HISTORY — DX: Personal history of adenomatous and serrated colon polyps: Z86.0101

## 2016-06-19 DIAGNOSIS — H524 Presbyopia: Secondary | ICD-10-CM | POA: Diagnosis not present

## 2016-06-19 DIAGNOSIS — Z0100 Encounter for examination of eyes and vision without abnormal findings: Secondary | ICD-10-CM | POA: Diagnosis not present

## 2016-06-19 DIAGNOSIS — H5203 Hypermetropia, bilateral: Secondary | ICD-10-CM | POA: Diagnosis not present

## 2016-06-19 DIAGNOSIS — H52209 Unspecified astigmatism, unspecified eye: Secondary | ICD-10-CM | POA: Diagnosis not present

## 2016-06-21 ENCOUNTER — Other Ambulatory Visit: Payer: Self-pay

## 2016-06-21 ENCOUNTER — Telehealth: Payer: Self-pay | Admitting: Family Medicine

## 2016-06-21 MED ORDER — ATORVASTATIN CALCIUM 20 MG PO TABS: 20.0000 mg | ORAL_TABLET | Freq: Every day | ORAL | 3 refills | Status: DC

## 2016-06-21 MED ORDER — LOSARTAN POTASSIUM-HCTZ 100-12.5 MG PO TABS
1.0000 | ORAL_TABLET | Freq: Every day | ORAL | 5 refills | Status: DC
Start: 2016-06-21 — End: 2017-01-01

## 2016-06-21 NOTE — Telephone Encounter (Signed)
Prescriptions sent to pharmacy as requested 

## 2016-06-21 NOTE — Telephone Encounter (Signed)
Patient needs refills on:  atorvastatin (LIPITOR) 20 MG tablet  losartan-hydrochlorothiazide (HYZAAR) 100-12.5 MG tablet  Sent to: [place], [place] - [id number] [phone] (Phone) [phone] (Fax)

## 2016-12-02 DIAGNOSIS — F321 Major depressive disorder, single episode, moderate: Secondary | ICD-10-CM | POA: Diagnosis not present

## 2016-12-02 DIAGNOSIS — R69 Illness, unspecified: Secondary | ICD-10-CM | POA: Diagnosis not present

## 2016-12-19 DIAGNOSIS — R69 Illness, unspecified: Secondary | ICD-10-CM | POA: Diagnosis not present

## 2016-12-21 DIAGNOSIS — 419620001 Death: Secondary | SNOMED CT | POA: Diagnosis not present

## 2016-12-21 DEATH — deceased

## 2016-12-30 DIAGNOSIS — R69 Illness, unspecified: Secondary | ICD-10-CM | POA: Diagnosis not present

## 2017-01-01 ENCOUNTER — Other Ambulatory Visit: Payer: Self-pay | Admitting: Family Medicine

## 2017-01-16 DIAGNOSIS — F321 Major depressive disorder, single episode, moderate: Secondary | ICD-10-CM | POA: Diagnosis not present

## 2017-01-16 DIAGNOSIS — R69 Illness, unspecified: Secondary | ICD-10-CM | POA: Diagnosis not present

## 2017-02-03 ENCOUNTER — Other Ambulatory Visit: Payer: Self-pay | Admitting: Family Medicine

## 2017-02-04 ENCOUNTER — Telehealth: Payer: Self-pay

## 2017-02-04 ENCOUNTER — Other Ambulatory Visit: Payer: Self-pay

## 2017-02-04 MED ORDER — LOSARTAN POTASSIUM-HCTZ 100-12.5 MG PO TABS: 1.0000 | ORAL_TABLET | Freq: Every day | ORAL | 0 refills | Status: DC

## 2017-02-04 NOTE — Telephone Encounter (Signed)
Called patient and left a message asking for patient to call the office to schedule an appointment. We have not seen him in 1.5 years. We have received request from [facility] for medication refills. Patient needs to be scheduled.

## 2017-02-04 NOTE — Telephone Encounter (Signed)
Patient called back and I have scheduled him to come in on Monday 02/10/17 at 9:45 AM.

## 2017-02-04 NOTE — Telephone Encounter (Signed)
Thank you :)

## 2017-02-10 ENCOUNTER — Ambulatory Visit (INDEPENDENT_AMBULATORY_CARE_PROVIDER_SITE_OTHER): Payer: Medicare HMO | Admitting: Physician Assistant

## 2017-02-10 ENCOUNTER — Ambulatory Visit: Payer: Medicare Other | Admitting: Family Medicine

## 2017-02-10 ENCOUNTER — Encounter: Payer: Self-pay | Admitting: Physician Assistant

## 2017-02-10 VITALS — BP 152/78 | HR 50 | Ht 72.0 in | Wt 223.8 lb

## 2017-02-10 DIAGNOSIS — E7800 Pure hypercholesterolemia, unspecified: Secondary | ICD-10-CM

## 2017-02-10 DIAGNOSIS — I10 Essential (primary) hypertension: Secondary | ICD-10-CM

## 2017-02-10 LAB — CBC WITH DIFFERENTIAL/PLATELET
BASOS PCT: 1 % (ref 0.0–3.0)
Basophils Absolute: 0.1 10*3/uL (ref 0.0–0.1)
EOS PCT: 0.6 % (ref 0.0–5.0)
Eosinophils Absolute: 0 10*3/uL (ref 0.0–0.7)
HEMATOCRIT: 41.2 % (ref 39.0–52.0)
Hemoglobin: 13.4 g/dL (ref 13.0–17.0)
LYMPHS ABS: 0.8 10*3/uL (ref 0.7–4.0)
LYMPHS PCT: 10.3 % — AB (ref 12.0–46.0)
MCHC: 32.6 g/dL (ref 30.0–36.0)
MCV: 90.9 fl (ref 78.0–100.0)
MONOS PCT: 9.1 % (ref 3.0–12.0)
Monocytes Absolute: 0.7 10*3/uL (ref 0.1–1.0)
NEUTROS ABS: 5.9 10*3/uL (ref 1.4–7.7)
NEUTROS PCT: 79 % — AB (ref 43.0–77.0)
PLATELETS: 179 10*3/uL (ref 150.0–400.0)
RBC: 4.53 Mil/uL (ref 4.22–5.81)
RDW: 12.9 % (ref 11.5–15.5)
WBC: 7.4 10*3/uL (ref 4.0–10.5)

## 2017-02-10 LAB — COMPREHENSIVE METABOLIC PANEL
ALBUMIN: 4.1 g/dL (ref 3.5–5.2)
ALT: 17 U/L (ref 0–53)
AST: 20 U/L (ref 0–37)
Alkaline Phosphatase: 49 U/L (ref 39–117)
BUN: 17 mg/dL (ref 6–23)
CALCIUM: 9.5 mg/dL (ref 8.4–10.5)
CHLORIDE: 110 meq/L (ref 96–112)
CO2: 26 mEq/L (ref 19–32)
Creatinine, Ser: 1.12 mg/dL (ref 0.40–1.50)
GFR: 82.02 mL/min (ref >60.00)
Glucose, Bld: 98 mg/dL (ref 70–99)
POTASSIUM: 4.3 meq/L (ref 3.5–5.1)
Sodium: 143 mEq/L (ref 135–145)
Total Bilirubin: 1 mg/dL (ref 0.2–1.2)
Total Protein: 6.6 g/dL (ref 6.0–8.3)

## 2017-02-10 LAB — LIPID PANEL
CHOL/HDL RATIO: 2
CHOLESTEROL: 109 mg/dL (ref 0–200)
HDL: 54.2 mg/dL (ref >39.00)
LDL CALC: 44 mg/dL (ref 0–99)
NonHDL: 54.49
Triglycerides: 54 mg/dL (ref 0.0–149.0)
VLDL: 10.8 mg/dL (ref 0.0–40.0)

## 2017-02-10 LAB — URINALYSIS, ROUTINE W REFLEX MICROSCOPIC
BILIRUBIN URINE: NEGATIVE
HGB URINE DIPSTICK: NEGATIVE
KETONES UR: NEGATIVE
LEUKOCYTES UA: NEGATIVE
NITRITE: NEGATIVE
RBC / HPF: NONE SEEN (ref 0–?)
Specific Gravity, Urine: 1.02 (ref 1.000–1.030)
Total Protein, Urine: NEGATIVE
URINE GLUCOSE: NEGATIVE
UROBILINOGEN UA: 0.2 (ref 0.0–1.0)
pH: 6.5 (ref 5.0–8.0)

## 2017-02-10 MED ORDER — BENAZEPRIL-HYDROCHLOROTHIAZIDE 20-25 MG PO TABS: 1.0000 | ORAL_TABLET | Freq: Every day | ORAL | 5 refills | Status: DC

## 2017-02-10 NOTE — Patient Instructions (Signed)
It was great to meet you!  I have refilled your blood pressure medication.  I will refill your cholesterol medication as soon as I get your labs back, as I may need to adjust the dosage. We should have your results back by end of tomorrow at the latest.  Please follow-up with Dr. Yong Channel in 6 months.

## 2017-02-10 NOTE — Progress Notes (Signed)
Samuel Mendez is a 76 y.o. male here for a follow up of a pre-existing problem.  History of Present Illness:   Chief Complaint  Patient presents with  . Follow-up    Rx refill    HPI   HTN -- Currently taking benazepril-HCTZ 20-25 mg daily. At home blood pressure readings are: 130/70 - 145/75. Patient denies chest pain, SOB, blurred vision, dizziness, unusual headaches, lower leg swelling. Patient is compliant with medication. Denies excessive caffeine intake, stimulant usage, excessive alcohol intake, or increase in salt consumption.  BP Readings from Last 3 Encounters:  02/10/17 (!) 152/78  06/06/16 122/64  09/11/15 110/72   Hyperlipidemia -- Currently taking Lipitor 20 mg daily. Denies myalgias. Most recent lipid panel is from May 2016 Lab Results  Component Value Date   CHOL 176 09/06/2015   HDL 40.30 09/06/2015   LDLCALC 117 (H) 09/06/2015   LDLDIRECT 99.0 08/22/2014   TRIG 92.0 09/06/2015   CHOLHDL 4 09/06/2015   *Patient that he has been either his Lipitor for his blood pressure medicine for 5 days. He does not know which one it is.  Past Medical History:  Diagnosis Date  . Deviated septum   . Diverticulosis of colon   . DJD (degenerative joint disease)   . Hx of colonic polyps   . Hypercholesteremia   . Hypertension   . Obesity   . Prostatitis   . RENAL CALCULUS 04/22/2007   x3, [place] doctor released him     Social History   Social History  . Marital status: Married    Spouse name: cora x 40 yrs  . Number of children: 3  . Years of education: N/A   Occupational History  . retired from truck driving in 9767   . 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 Smoking status: Current Some Day Smoker    Types: Cigars  . Smokeless tobacco: Never Used     Comment: 1-2 per week of cigars only after retirement  . Alcohol use No  . Drug use: No  . Sexual activity: Not on file   Other Topics Concern  . Not on file   Social  History Narrative   Married (wife pt elsewhere). 3 children, 2 from current wife. 4 grandkids. No greatgrandkids.       Farms-raises cattle. Retired from truck driving.       Hobbies: works on old cars (17 [employer]), travel    Past Surgical History:  Procedure Laterality Date  . anal fistula repair     in 90s-outpatient repair  . colles fracture left wrist after fall  11/2007  . COLONOSCOPY     2013  . SEPTOPLASTY N/A 05/08/2015   Procedure: NASAL SEPTOPLASTY;  Surgeon: Leta Baptist, MD;  Location: [facility];  Service: ENT;  Laterality: N/A;  . SINUS ENDO WITH FUSION Bilateral 05/08/2015   Procedure: ENDOSCOPIC BILATERAL  ETHMOIDECTOMY, BILATERAL MAXILLARY ANTROSTOMY, BILATERAL SPHENOIDECTOMY, BILATERAL  FRONTAL RECESS EXPLORATION WITH FUSION NAVIGATION;  Surgeon: Leta Baptist, MD;  Location: [facility];  Service: ENT;  Laterality: Bilateral;    Family History  Problem Relation Age of Onset  . Pancreatic cancer Father   . Hypertension Mother   . Aneurysm Mother        brain died at 87  . Colon cancer Neg Hx     No Known Allergies  Current Medications:   Current Outpatient Prescriptions:  .  atorvastatin (LIPITOR) 20 MG  tablet, Take 1 tablet (20 mg total) by mouth daily., Disp: 90 tablet, Rfl: 3 .  benazepril-hydrochlorthiazide (LOTENSIN HCT) 20-25 MG tablet, Take 1 tablet by mouth daily., Disp: 30 tablet, Rfl: 5  Current Facility-Administered Medications:  .  0.9 %  sodium chloride infusion, 500 mL, Intravenous, Continuous, Irene Shipper, MD   Review of Systems:   ROS  Negative unless otherwise specified per history of present illness  Vitals:   Vitals:   02/10/17 0929 02/10/17 0943 02/10/17 0947  BP: (!) 148/80 (!) 156/84 (!) 152/78  Pulse: (!) 50    SpO2: 97%    Weight: 223 lb 12.8 oz (101.5 kg)    Height: 6' (1.829 m)       Body mass index is 30.35 kg/m.  Physical Exam:   Physical Exam  Constitutional: He appears well-developed. He  is cooperative.  Non-toxic appearance. He does not have a sickly appearance. He does not appear ill. No distress.  Cardiovascular: Normal rate, regular rhythm, S1 normal, S2 normal, normal heart sounds and normal pulses.   No LE edema  Pulmonary/Chest: Effort normal and breath sounds normal.  Neurological: He is alert. GCS eye subscore is 4. GCS verbal subscore is 5. GCS motor subscore is 6.  Skin: Skin is warm, dry and intact.  Psychiatric: He has a normal mood and affect. His speech is normal and behavior is normal.  Nursing note and vitals reviewed.   Assessment and Plan:    Westin was seen today for follow-up.  Diagnoses and all orders for this visit:  Essential hypertension Blood pressure is elevated today. He is unsure if he has been out of his blood pressure medicine or his cholesterol medicine for 5 days. I am going to recheck labs as it has been since last May (2017) since this has been performed. I want him to follow-up in about a month just to make sure his blood pressure is back on track. Patient verbalized understanding. -     Lipid panel -     Comprehensive metabolic panel -     Urinalysis, Routine w reflex microscopic -     CBC with Differential/Platelet  HYPERCHOLESTEROLEMIA I discussed with patient that I will reorder his cholesterol medicine after I review his lipid panel. I will consider dose adjustment based on lipid results. Patient verbalized understanding. Follow-up for cholesterol issues in 6 months. -     Lipid panel -     Comprehensive metabolic panel -     CBC with Differential/Platelet   . Reviewed expectations re: course of current medical issues. . Discussed self-management of symptoms. . Outlined signs and symptoms indicating need for more acute intervention. . Patient verbalized understanding and all questions were answered. . See orders for this visit as documented in the electronic medical record. . Patient received an After-Visit Summary.  CMA or  LPN served as scribe during this visit. History, Physical, and Plan performed by medical provider. Documentation and orders reviewed and attested to.  Inda Coke, PA-C

## 2017-02-11 ENCOUNTER — Telehealth: Payer: Self-pay | Admitting: Family Medicine

## 2017-02-11 ENCOUNTER — Other Ambulatory Visit: Payer: Self-pay | Admitting: Physician Assistant

## 2017-02-11 MED ORDER — ATORVASTATIN CALCIUM 20 MG PO TABS: 20.0000 mg | ORAL_TABLET | Freq: Every day | ORAL | 1 refills | Status: DC

## 2017-02-11 NOTE — Telephone Encounter (Signed)
please call back re labs.  Ty,  -LL

## 2017-02-12 NOTE — Telephone Encounter (Signed)
Spoke with wife Mel Almond (on [place]) and advised of lab results.

## 2017-02-24 DIAGNOSIS — R69 Illness, unspecified: Secondary | ICD-10-CM | POA: Diagnosis not present

## 2017-02-24 DIAGNOSIS — F321 Major depressive disorder, single episode, moderate: Secondary | ICD-10-CM | POA: Diagnosis not present

## 2017-03-12 ENCOUNTER — Other Ambulatory Visit: Payer: Self-pay | Admitting: Family Medicine

## 2017-03-12 NOTE — Telephone Encounter (Signed)
Not sure why he was switched from notes. Can you ask patient why it was changed and why he wants to change back- I am ok with benazepril or losartan combo pill with hctz

## 2017-03-17 ENCOUNTER — Ambulatory Visit (INDEPENDENT_AMBULATORY_CARE_PROVIDER_SITE_OTHER): Payer: Medicare HMO | Admitting: *Deleted

## 2017-03-17 DIAGNOSIS — I10 Essential (primary) hypertension: Secondary | ICD-10-CM | POA: Diagnosis not present

## 2017-03-17 MED ORDER — BENAZEPRIL-HYDROCHLOROTHIAZIDE 20-25 MG PO TABS: 1.0000 | ORAL_TABLET | Freq: Every day | ORAL | 5 refills | Status: DC

## 2017-03-17 NOTE — Progress Notes (Signed)
Ran out of Lotensin Thursday. Spoke to Dr Yong Channel, we will refill Lotensin and I scheduled a follow up appointment with Dr Yong Channel for next week.

## 2017-03-17 NOTE — Progress Notes (Signed)
I have reviewed and agree with note, evaluation, plan.  BP Readings from Last 3 Encounters:  03/17/17 (!) 170/90  02/10/17 (!) 152/78  06/06/16 122/64  BP had been trending up and may need increased rx even with this restarted- will see next week  Garret Reddish, MD

## 2017-03-17 NOTE — Progress Notes (Signed)
Pre visit review using our clinic review tool, if applicable. No additional management support is needed unless otherwise documented below in the visit note. 

## 2017-03-17 NOTE — Patient Instructions (Signed)
Follow up with PCP at scheduled appt.

## 2017-03-27 ENCOUNTER — Ambulatory Visit (INDEPENDENT_AMBULATORY_CARE_PROVIDER_SITE_OTHER): Payer: Medicare HMO | Admitting: Family Medicine

## 2017-03-27 ENCOUNTER — Encounter: Payer: Self-pay | Admitting: Family Medicine

## 2017-03-27 VITALS — BP 118/70 | HR 57 | Temp 98.3°F | Ht 72.0 in | Wt 220.6 lb

## 2017-03-27 DIAGNOSIS — R69 Illness, unspecified: Secondary | ICD-10-CM | POA: Diagnosis not present

## 2017-03-27 DIAGNOSIS — I10 Essential (primary) hypertension: Secondary | ICD-10-CM

## 2017-03-27 DIAGNOSIS — F411 Generalized anxiety disorder: Secondary | ICD-10-CM | POA: Diagnosis not present

## 2017-03-27 DIAGNOSIS — F321 Major depressive disorder, single episode, moderate: Secondary | ICD-10-CM

## 2017-03-27 HISTORY — DX: Major depressive disorder, single episode, moderate: F32.1

## 2017-03-27 HISTORY — DX: Generalized anxiety disorder: F41.1

## 2017-03-27 MED ORDER — ESCITALOPRAM OXALATE 5 MG PO TABS: 5.0000 mg | ORAL_TABLET | Freq: Every day | ORAL | 5 refills | Status: DC

## 2017-03-27 MED ORDER — BENAZEPRIL-HYDROCHLOROTHIAZIDE 20-25 MG PO TABS: 1.0000 | ORAL_TABLET | Freq: Every day | ORAL | 3 refills | Status: DC

## 2017-03-27 NOTE — Assessment & Plan Note (Signed)
S: controlled poorly on last check as had run out of losartan-hctz 100-12.5mg  BP Readings from Last 3 Encounters:  03/27/17 118/70  03/17/17 (!) 170/90  02/10/17 (!) 152/78  A/P: We discussed blood pressure goal of <140/90. Continue current meds:  Losartan-hctz 100-12.5mg . I was concerned BP would still be too high back on this with level of elevation at last visit- luckily controlled.

## 2017-03-27 NOTE — Progress Notes (Signed)
Subjective:  Samuel Mendez is a 76 y.o. year old very pleasant male patient who presents for/with See problem oriented charting ROS- No chest pain or shortness of breath. No headache or blurry vision.     Past Medical History-  Patient Active Problem List   Diagnosis Date Noted  . Depression, major, single episode, moderate ([place]) 03/27/2017    Priority: High  . GAD (generalized anxiety disorder) 03/27/2017    Priority: Medium  . History of adenomatous polyp of colon 06/13/2016    Priority: Medium  . BPH (benign prostatic hyperplasia) 05/04/2014    Priority: Medium  . Anxiety and depression 04/30/2012    Priority: Medium  . HYPERCHOLESTEROLEMIA 04/24/2007    Priority: Medium  . Essential hypertension 04/22/2007    Priority: Medium  . Anemia, unspecified 09/11/2015    Priority: Low  . Obesity 05/01/2011    Priority: Low  . COLONIC POLYPS 04/24/2007    Priority: Low  . Osteoarthritis 04/22/2007    Priority: Low    Medications- reviewed and updated Current Outpatient Medications  Medication Sig Dispense Refill  . atorvastatin (LIPITOR) 20 MG tablet Take 1 tablet (20 mg total) by mouth daily. 90 tablet 1  . benazepril-hydrochlorthiazide (LOTENSIN HCT) 20-25 MG tablet Take 1 tablet by mouth daily. 30 tablet 5   Objective: BP 118/70 (BP Location: Left Arm, Patient Position: Sitting, Cuff Size: Large)   Pulse (!) 57   Temp 98.3 F (36.8 C) (Oral)   Ht 6' (1.829 m)   Wt 220 lb 9.6 oz (100.1 kg)   SpO2 94%   BMI 29.92 kg/m  Gen: NAD, resting comfortably No obvious thyromegaly CV: RRR no murmurs rubs or gallops Lungs: CTAB no crackles, wheeze, rhonchi  Ext: no edema Skin: warm, dry, no rash  Assessment/Plan:  Essential hypertension S: controlled poorly on last check as had run out of losartan-hctz 100-12.5mg  BP Readings from Last 3 Encounters:  03/27/17 118/70  03/17/17 (!) 170/90  02/10/17 (!) 152/78  A/P: We discussed blood pressure goal of <140/90. Continue  current meds:  Losartan-hctz 100-12.5mg . I was concerned BP would still be too high back on this with level of elevation at last visit- luckily controlled.   Depression, major, single episode, moderate ([place]) S: when he was in the service he was on a medication for depression/ptsd- made muscles twitch, couldn't focus. Also appears Dr. Lenna Gilford had started sertraline 50mg  04/30/12- felt like it made symptoms worse- only took for a few days and stopped and never followed up about it. Prior had told me intermittent anxiety/depression but never on medication. Will change that in problem list.   Started seeing a psychologist since this summer for depression/anxiety/mood swings. Counseling seems to help- going monthly  No history mania or hypomania symptoms. Denies flashbacks to service. No QT prolongation last year on EKG.   PHQ9 up to 16 today. GAD7 of 12 A/P: new onset depression. Recent labs without cause- no indication to repeat thyroid from last year.  New onset GAD.  - we will start escitalopram 5mg  which will help both issues. Follow up 3 weeks. Likely increase to 10mg  at that time. Stressed importance of taking the medication. At his age not highly likely to go above 10mg  but could consider.   GAD (generalized anxiety disorder) See depression section for discussion   Future Appointments  Date Time Provider [place]  07/16/2017 11:00 AM Marin Olp, MD [facility]  08/11/2017  8:30 AM Marin Olp, MD [facility]  08/11/2017 10:00 AM Williemae Area, RN [facility]   3 weeks  Meds ordered this encounter  Medications  . escitalopram (LEXAPRO) 5 MG tablet    Sig: Take 1 tablet (5 mg total) by mouth daily.    Dispense:  30 tablet    Refill:  5  . benazepril-hydrochlorthiazide (LOTENSIN HCT) 20-25 MG tablet    Sig: Take 1 tablet by mouth daily.    Dispense:  90 tablet    Refill:  3    Return precautions advised.  Garret Reddish, MD

## 2017-03-27 NOTE — Assessment & Plan Note (Signed)
See depression section for discussion

## 2017-03-27 NOTE — Patient Instructions (Addendum)
Blood pressure looks great  Start escitalopram for new onset depression. Start at 5 mg. See me back in 3 weeks- schedule this before you leave  Taking the medicine as directed and not missing any doses is one of the best things you can do to treat your depression.  Here are some things to keep in mind:  1) Side effects (stomach upset, some increased anxiety) may happen before you notice a benefit.  These side effects typically go away over time. 2) Changes to your dose of medicine or a change in medication all together is sometimes necessary 3) Most people need to be on medication at least 6-12 months 4) Many people will notice an improvement within two weeks but the full effect of the medication can take up to 4-6 weeks 5) Stopping the medication when you start feeling better often results in a return of symptoms 6) If you start having thoughts of hurting yourself or others after starting this medicine, call our office immediately at [phone] or seek care through 911.  Also inform wife immediately.

## 2017-03-27 NOTE — Assessment & Plan Note (Signed)
S: when he was in the service he was on a medication for depression/ptsd- made muscles twitch, couldn't focus. Also appears Dr. Lenna Gilford had started sertraline 50mg  04/30/12- felt like it made symptoms worse- only took for a few days and stopped and never followed up about it. Prior had told me intermittent anxiety/depression but never on medication. Will change that in problem list.   Started seeing a psychologist since this summer for depression/anxiety/mood swings. Counseling seems to help- going monthly  No history mania or hypomania symptoms. Denies flashbacks to service. No QT prolongation last year on EKG.   PHQ9 up to 16 today. GAD7 of 12 A/P: new onset depression. Recent labs without cause- no indication to repeat thyroid from last year.  New onset GAD.  - we will start escitalopram 5mg  which will help both issues. Follow up 3 weeks. Likely increase to 10mg  at that time. Stressed importance of taking the medication. At his age not highly likely to go above 10mg  but could consider.

## 2017-04-02 DIAGNOSIS — R69 Illness, unspecified: Secondary | ICD-10-CM | POA: Diagnosis not present

## 2017-04-02 DIAGNOSIS — F321 Major depressive disorder, single episode, moderate: Secondary | ICD-10-CM | POA: Diagnosis not present

## 2017-05-13 ENCOUNTER — Encounter: Payer: Self-pay | Admitting: Family Medicine

## 2017-05-13 ENCOUNTER — Ambulatory Visit (INDEPENDENT_AMBULATORY_CARE_PROVIDER_SITE_OTHER): Payer: Medicare HMO | Admitting: Family Medicine

## 2017-05-13 VITALS — BP 190/88 | HR 57 | Temp 97.6°F | Resp 16

## 2017-05-13 DIAGNOSIS — R079 Chest pain, unspecified: Secondary | ICD-10-CM | POA: Diagnosis not present

## 2017-05-13 DIAGNOSIS — R9431 Abnormal electrocardiogram [ECG] [EKG]: Secondary | ICD-10-CM | POA: Diagnosis not present

## 2017-05-13 DIAGNOSIS — I10 Essential (primary) hypertension: Secondary | ICD-10-CM | POA: Diagnosis not present

## 2017-05-13 MED ORDER — BENAZEPRIL-HYDROCHLOROTHIAZIDE 20-25 MG PO TABS: 1.0000 | ORAL_TABLET | Freq: Every day | ORAL | 3 refills | Status: DC

## 2017-05-13 NOTE — Assessment & Plan Note (Signed)
S: controlled poorly on no rx. I mistakenly wrote patient was on losartan hctz last visit but actually was on benazepril hctz 20-25mg . Home #s can be up to 190, was 173 this Am before coming here. Has had some chest pain as below and slight headaches- none at this time BP Readings from Last 3 Encounters:  05/13/17 (!) 190/88. Repeat   03/27/17 118/70  03/17/17 (!) 170/90  A/P: We discussed blood pressure goal of <140/90. Poor control today off medication- sent benazepril hctz to pharmacy and had Jamie call to confirm that they have received this and he can pick this up.

## 2017-05-13 NOTE — Progress Notes (Signed)
Subjective:  Samuel Mendez is a 77 y.o. year old very pleasant male patient who presents for/with See problem oriented charting ROS- no shortness of breath, dizziness, blurry vision, double vision, edema.    Past Medical History-  Patient Active Problem List   Diagnosis Date Noted  . Depression, major, single episode, moderate ([place]) 03/27/2017    Priority: High  . GAD (generalized anxiety disorder) 03/27/2017    Priority: Medium  . History of adenomatous polyp of colon 06/13/2016    Priority: Medium  . BPH (benign prostatic hyperplasia) 05/04/2014    Priority: Medium  . Anxiety and depression 04/30/2012    Priority: Medium  . HYPERCHOLESTEROLEMIA 04/24/2007    Priority: Medium  . Essential hypertension 04/22/2007    Priority: Medium  . Anemia, unspecified 09/11/2015    Priority: Low  . Obesity 05/01/2011    Priority: Low  . COLONIC POLYPS 04/24/2007    Priority: Low  . Osteoarthritis 04/22/2007    Priority: Low    Medications- reviewed and updated Current Outpatient Medications  Medication Sig Dispense Refill  . atorvastatin (LIPITOR) 20 MG tablet Take 1 tablet (20 mg total) by mouth daily. 90 tablet 1  . benazepril-hydrochlorthiazide (LOTENSIN HCT) 20-25 MG tablet Take 1 tablet by mouth daily. (Patient not taking: Reported on 05/13/2017) 90 tablet 3  . escitalopram (LEXAPRO) 5 MG tablet Take 1 tablet (5 mg total) by mouth daily. (Patient not taking: Reported on 05/13/2017) 30 tablet 5   Objective: BP (!) 190/88 (BP Location: Left Arm, Patient Position: Sitting, Cuff Size: Large)   Pulse (!) 57   Temp 97.6 F (36.4 C) (Oral)   Resp 16   SpO2 97%  Gen: NAD, resting comfortably CV: RRR no murmurs rubs or gallops Lungs: CTAB no crackles, wheeze, rhonchi Abdomen: soft/nontender/nondistended/normal bowel sounds.  Ext: no edema Skin: warm, dry  EKg: sinus bradycardia with rate 53, normal axis, normal intervals, no hypertrophy- I do not see obvious atrial enlargement.  Does have inverted t wave in v2 which appears new since 05/04/15.   Assessment/Plan:  Chest pain, unspecified type - Plan: EKG 12-Lead Inverted t wave on EKG S: every once in a while gets a mild chest pain across chest for a minute then resolves. No shortness of breath, left arm or neck pain, dizziness, diaphoresis. A lot of times this happens after meals- has happened a few times over last month since out of blood pressure medicine. Once happened just walking to the mailbox. 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 Will refer to cardiology for their opinion- will also ask patient to start aspirin 81mg   Essential hypertension S: controlled poorly on no rx. I mistakenly wrote patient was on losartan hctz last visit but actually was on benazepril hctz 20-25mg . Home #s can be up to 190, was 173 this Am before coming here. Has had some chest pain as below and slight headaches- none at this time BP Readings from Last 3 Encounters:  05/13/17 (!) 190/88. Repeat   03/27/17 118/70  03/17/17 (!) 170/90  A/P: We discussed blood pressure goal of <140/90. Poor control today off medication- sent benazepril hctz to pharmacy and had Jamie call to confirm that they have received this and he can pick this up.   Future Appointments  Date Time Provider [place]  07/16/2017 11:00 AM Marin Olp, MD [facility]  08/11/2017  8:30 AM Marin Olp, MD [facility]  08/11/2017 10:00 AM Williemae Area, RN [facility]   Lab/Order associations: Chest pain, unspecified type - Plan: EKG 12-Lead, Ambulatory referral to Cardiology  Essential hypertension - Plan: benazepril-hydrochlorthiazide (LOTENSIN HCT) 20-25 MG tablet  Inverted T wave - Plan: Ambulatory referral to Cardiology  Meds ordered this encounter  Medications  .  benazepril-hydrochlorthiazide (LOTENSIN HCT) 20-25 MG tablet    Sig: Take 1 tablet by mouth daily.    Dispense:  90 tablet    Refill:  3    Return precautions advised.  Garret Reddish, MD

## 2017-05-13 NOTE — Patient Instructions (Signed)
If you have worsening chest pain, seek care immediately  Lets get you back on your blood pressure medicine- confirmed with pharmacy that they are filling it. Call us or send mychart message if any issues as we have to get this medicine is your system  Check blood pressure at home with goal <140/90- should start trending down soon. Update me next week if everything looks good- if not improving- get back in here for a recheck pronto.

## 2017-05-15 ENCOUNTER — Ambulatory Visit: Payer: Medicare HMO | Admitting: Cardiology

## 2017-05-15 ENCOUNTER — Encounter: Payer: Self-pay | Admitting: Cardiology

## 2017-05-15 VITALS — BP 136/88 | HR 58 | Ht 72.0 in | Wt 220.2 lb

## 2017-05-15 DIAGNOSIS — I10 Essential (primary) hypertension: Secondary | ICD-10-CM | POA: Diagnosis not present

## 2017-05-15 DIAGNOSIS — R079 Chest pain, unspecified: Secondary | ICD-10-CM

## 2017-05-15 NOTE — Progress Notes (Signed)
Cardiology Office Note:    Date:  05/15/2017   ID:  Samuel Mendez, DOB March 27, 1941, MRN [id number]  PCP:  Marin Olp, MD  Cardiologist:  No primary care provider on file.   Referring MD: Marin Olp, MD     History of Present Illness:    Samuel Mendez is a 77 y.o. male here for evaluation of chest pain, inverted T waves on ECG at the request of Dr. Yong Channel.  Described chest pain mid chest occurring 1 minute resolving.  No shortness of breath.  Happen sometimes after meals.  Happened after missing his blood pressure medications.  His blood pressure was highly elevated 190/88 during a prior appointment.  He felt it when walking the mailbox.  New flipped T wave in V2 was noted.  He started him on aspirin.  Benazepril HCTZ was sent to pharmacy.  Past Medical History:  Diagnosis Date  . Anemia, unspecified 09/11/2015   Anemia is due to giving platelets so frequently- we discussed slowing this down.    Samuel Mendez Anxiety and depression 04/30/2012   Comes and goes, never on medication   . BPH (benign prostatic hyperplasia) 05/04/2014   Myrbetriq per Dr. Lenna Gilford for overactive bladder symptoms then came off and did not recur Also saw palmetto. Nocturia 0-1x a night   . Cerumen impaction 03/26/2012  . COLONIC POLYPS 04/24/2007   04/2011 adenoma x3- 5 year repeat    . Depression, major, single episode, moderate ([place]) 03/27/2017  . Deviated septum   . Diverticulosis of colon   . Diverticulosis of large intestine 04/24/2007   Qualifier: Diagnosis of  By: Lenna Gilford MD, Deborra Medina   . DJD (degenerative joint disease)   . GAD (generalized anxiety disorder) 03/27/2017  . History of adenomatous polyp of colon 06/13/2016   2.2018. 5 year?  Samuel Mendez Hx of colonic polyps   . Hypercholesteremia   . Hypertension   . MYALGIA 07/29/2007   Qualifier: Diagnosis of  By: Royal Piedra NP, Tammy    . NECK PAIN 04/27/2010   Qualifier: Diagnosis of  By: Lenna Gilford MD, Deborra Medina   . Obesity   . Osteoarthritis 04/22/2007   Cervical (turns  whole body at times), hands in past. No rx.     . Prostatitis   . PROSTATITIS, HX OF 04/22/2007   Qualifier: Diagnosis of  By: Julien Girt CMA, Leigh    . RENAL CALCULUS 04/22/2007   x3,  doctor released him  . Sinusitis, acute 03/26/2012    Past Surgical History:  Procedure Laterality Date  . anal fistula repair     in 90s-outpatient repair  . colles fracture left wrist after fall  11/2007  . COLONOSCOPY     2013  . SEPTOPLASTY N/A 05/08/2015   Procedure: NASAL SEPTOPLASTY;  Surgeon: Leta Baptist, MD;  Location: [facility];  Service: ENT;  Laterality: N/A;  . SINUS ENDO WITH FUSION Bilateral 05/08/2015   Procedure: ENDOSCOPIC BILATERAL  ETHMOIDECTOMY, BILATERAL MAXILLARY ANTROSTOMY, BILATERAL SPHENOIDECTOMY, BILATERAL  FRONTAL RECESS EXPLORATION WITH FUSION NAVIGATION;  Surgeon: Leta Baptist, MD;  Location: [facility];  Service: ENT;  Laterality: Bilateral;    Current Medications: Current Meds  Medication Sig  . aspirin EC 81 MG tablet Take 81 mg by mouth daily.  Samuel Mendez atorvastatin (LIPITOR) 20 MG tablet Take 1 tablet (20 mg total) by mouth daily.  . benazepril-hydrochlorthiazide (LOTENSIN HCT) 20-25 MG tablet Take 1 tablet by mouth daily.  Samuel Mendez escitalopram (LEXAPRO) 5 MG tablet Take 1 tablet (  5 mg total) by mouth daily.  Samuel Mendez OVER THE COUNTER MEDICATION Take 450 mg by mouth daily. Saw Palmetto     Allergies:   Patient has no known allergies.   Social History   Socioeconomic History  . Marital status: Married    Spouse name: cora x 40 yrs  . Number of children: 3  . Years of education: None  . Highest education level: None  Social Needs  . Financial resource strain: None  . Food insecurity - worry: None  . Food insecurity - inability: None  . Transportation needs - medical: None  . Transportation needs - non-medical: None  Occupational History  . Occupation: retired from truck driving in 7017  . 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  Smoking status: Current Some Day Smoker    Types: Cigars  . Smokeless tobacco: Never Used  . Tobacco comment: 1-2 per week of cigars only after retirement  Substance and Sexual Activity  . Alcohol use: No    Alcohol/week: 0.0 oz  . Drug use: No  . Sexual activity: None  Other Topics Concern  . None  Social History Narrative   Married (wife pt elsewhere). 3 children, 2 from current wife. 4 grandkids. No greatgrandkids.       Farms-raises cattle. Retired from truck driving.       Hobbies: works on old cars (49 thunderbird), travel     Family History: The patient's family history includes Aneurysm in his mother; Hypertension in his mother; Pancreatic cancer in his father. There is no history of Colon cancer.  No early CAD issues.   ROS:   Please see the history of present illness.     All other systems reviewed and are negative.  EKGs/Labs/Other Studies Reviewed:    The following studies were reviewed today: Prior EKG, lab work, office notes reviewed  EKG:  EKG is not ordered today.  Prior EKG showed sinus rhythm with T wave inversion new in V2, personally reviewed  Recent Labs: 02/10/2017: ALT 17; BUN 17; Creatinine, Ser 1.12; Hemoglobin 13.4; Platelets 179.0; Potassium 4.3; Sodium 143  Recent Lipid Panel    Component Value Date/Time   CHOL 109 02/10/2017 0944   TRIG 54.0 02/10/2017 0944   HDL 54.20 02/10/2017 0944   CHOLHDL 2 02/10/2017 0944   VLDL 10.8 02/10/2017 0944   LDLCALC 44 02/10/2017 0944   LDLDIRECT 99.0 08/22/2014 1355    Physical Exam:    VS:  BP 136/88 (BP Location: Left Arm, Patient Position: Sitting, Cuff Size: Large)   Pulse (!) 58   Ht 6' (1.829 m)   Wt 220 lb 3.2 oz (99.9 kg)   SpO2 92%   BMI 29.86 kg/m     Wt Readings from Last 3 Encounters:  05/15/17 220 lb 3.2 oz (99.9 kg)  03/27/17 220 lb 9.6 oz (100.1 kg)  02/10/17 223 lb 12.8 oz (101.5 kg)     GEN:  Well nourished, well developed in no acute distress HEENT: Normal NECK: No JVD;  No carotid bruits LYMPHATICS: No lymphadenopathy CARDIAC: RRR, no murmurs, rubs, gallops RESPIRATORY:  Clear to auscultation without rales, wheezing or rhonchi  ABDOMEN: Soft, non-tender, non-distended MUSCULOSKELETAL:  No edema; No deformity  SKIN: Warm and dry NEUROLOGIC:  Alert and oriented x 3 PSYCHIATRIC:  Normal affect   ASSESSMENT:    1. Essential hypertension   2. Chest pain, unspecified type    PLAN:    In order of problems listed above:  Atypical chest pain - We will go ahead and order a nuclear stress test to ensure that there is no evidence of high risk/ischemia.  T wave inversion noted in V2.  Hopefully this is not indicative of ischemia.  This is a change however from his prior EKG.  His symptoms could be synonymous with GI disturbance, GERD.  Essential hypertension -Restarted his benazepril hydrochlorothiazide and he is now under good control.  Continue.  Will let him know results of test, PRN follow up   Medication Adjustments/Labs and Tests Ordered: Current medicines are reviewed at length with the patient today.  Concerns regarding medicines are outlined above.  Orders Placed This Encounter  Procedures  . Myocardial Perfusion Imaging   No orders of the defined types were placed in this encounter.   Signed, Candee Furbish, MD  05/15/2017 4:04 PM    [facility]

## 2017-05-15 NOTE — Patient Instructions (Signed)
Medication Instructions:  The current medical regimen is effective;  continue present plan and medications.  Testing/Procedures: Your physician has requested that you have a myoview. For further information please visit [email or web address]. Please follow instruction sheet, as given.  Follow-Up: Follow up as needed after your stress test.  If you need a refill on your cardiac medications before your next appointment, please call your pharmacy.  Thank you for choosing [facility]!!

## 2017-05-19 ENCOUNTER — Telehealth: Payer: Self-pay | Admitting: Family Medicine

## 2017-05-19 NOTE — Telephone Encounter (Signed)
From Samuel Mendez  "This was a message that you asked me to follow up on last week.   Can you let patient know I referred him to cardiology? Also- advise him to take aspirin 81mg  and make sure he got back on his blood pressure medicine- and let me know if home readings are improving.  Samuel Mendez  (Routing comment) "  Appears patient has followed directions and has appropriate follow up. Thanks for making sure patient did Samuel Mendez.

## 2017-05-19 NOTE — Telephone Encounter (Signed)
Please be advised.  °

## 2017-05-19 NOTE — Telephone Encounter (Signed)
Is any follow up needed? Not clear if this is just an FYI- stating FYI would help if it is

## 2017-05-19 NOTE — Progress Notes (Signed)
Called patient and wife states he is not home. I did not see a DPR so I asked that she have him call our office back. She states it will probably be tomorrow as she isn't expecting him until after our office closes

## 2017-05-19 NOTE — Telephone Encounter (Signed)
Copied from [place] [phone]. Topic: General - Other >> May 19, 2017  4:19 PM Lolita Rieger, [place] wrote: Reason for CRM: pt aware and he stated that he has appointment Friday for stress test and that he is taking an aspirin and b/p medication

## 2017-05-20 ENCOUNTER — Telehealth (HOSPITAL_COMMUNITY): Payer: Self-pay | Admitting: *Deleted

## 2017-05-20 NOTE — Telephone Encounter (Signed)
Patient's wife per DPR given detailed instructions per Myocardial Perfusion Study Information Sheet for the test on 05/23/17 at 1100. Patient notified to arrive 15 minutes early and that it is imperative to arrive on time for appointment to keep from having the test rescheduled.  If you need to cancel or reschedule your appointment, please call the office within 24 hours of your appointment. . Patient verbalized understanding.Markita Stcharles, [place]e [place]

## 2017-05-23 ENCOUNTER — Ambulatory Visit (HOSPITAL_COMMUNITY): Payer: Medicare HMO | Attending: Internal Medicine

## 2017-05-23 DIAGNOSIS — R079 Chest pain, unspecified: Secondary | ICD-10-CM | POA: Diagnosis not present

## 2017-05-23 LAB — MYOCARDIAL PERFUSION IMAGING
CHL CUP NUCLEAR SDS: 2
CHL CUP NUCLEAR SSS: 2
CSEPPHR: 73 {beats}/min
LVDIAVOL: 116 mL (ref 62–150)
LVSYSVOL: 50 mL
RATE: 0.28
Rest HR: 55 {beats}/min
SRS: 0
TID: 0.98

## 2017-05-23 MED ORDER — REGADENOSON 0.4 MG/5ML IV SOLN
0.4000 mg | Freq: Once | INTRAVENOUS | Status: AC
  Administered 2017-05-23: 0.4 mg via INTRAVENOUS

## 2017-05-23 MED ORDER — TECHNETIUM TC 99M TETROFOSMIN IV KIT
10.1000 | PACK | Freq: Once | INTRAVENOUS | Status: AC | PRN
  Administered 2017-05-23: 10.1 via INTRAVENOUS
  Filled 2017-05-23: qty 11

## 2017-05-23 MED ORDER — TECHNETIUM TC 99M TETROFOSMIN IV KIT
30.7000 | PACK | Freq: Once | INTRAVENOUS | Status: AC | PRN
  Administered 2017-05-23: 30.7 via INTRAVENOUS
  Filled 2017-05-23: qty 31

## 2017-05-29 ENCOUNTER — Telehealth: Payer: Self-pay | Admitting: Cardiology

## 2017-05-29 NOTE — Telephone Encounter (Signed)
°  New message     Patient calling for results Myocardial Perfusion

## 2017-05-29 NOTE — Telephone Encounter (Signed)
New message ° ° ° °Patient calling for results: °

## 2017-05-29 NOTE — Telephone Encounter (Signed)
Left message for patient to c/b for results.

## 2017-05-29 NOTE — Telephone Encounter (Signed)
Spoke with patient who is aware of his recent stress test results.

## 2017-06-25 DIAGNOSIS — R69 Illness, unspecified: Secondary | ICD-10-CM | POA: Diagnosis not present

## 2017-07-16 ENCOUNTER — Ambulatory Visit: Payer: Medicare Other | Admitting: Family Medicine

## 2017-08-07 NOTE — Progress Notes (Deleted)
Subjective:   Samuel Mendez is a 77 y.o. male who presents for an Initial Medicare Annual Wellness Visit.  Review of Systems  No ROS.  Medicare Wellness Visit. Additional risk factors are reflected in the social history.       Objective:    There were no vitals filed for this visit. There is no height or weight on file to calculate BMI.  Advanced Directives 06/06/2016 05/23/2016 09/11/2015 05/08/2015 05/03/2015  Does Patient Have a Medical Advance Directive? No No Yes;No Yes Yes  Type of Advance Directive - - Engineer, production  Does patient want to make changes to medical advance directive? - - Yes - information given No - Patient declined -  Copy of [place] in Chart? - - - No - copy requested -  Would patient like information on creating a medical advance directive? - - No - patient declined information - -    Current Medications (verified) Outpatient Encounter Medications as of 08/11/2017  Medication Sig  . aspirin EC 81 MG tablet Take 81 mg by mouth daily.  Marland Kitchen atorvastatin (LIPITOR) 20 MG tablet Take 1 tablet (20 mg total) by mouth daily.  . benazepril-hydrochlorthiazide (LOTENSIN HCT) 20-25 MG tablet Take 1 tablet by mouth daily.  Marland Kitchen escitalopram (LEXAPRO) 5 MG tablet Take 1 tablet (5 mg total) by mouth daily.  Marland Kitchen OVER THE COUNTER MEDICATION Take 450 mg by mouth daily. Saw Palmetto   No facility-administered encounter medications on file as of 08/11/2017.     Allergies (verified) Patient has no known allergies.   History: Past Medical History:  Diagnosis Date  . Anemia, unspecified 09/11/2015   Anemia is due to giving platelets so frequently- we discussed slowing this down.    Marland Kitchen Anxiety and depression 04/30/2012   Comes and goes, never on medication   . BPH (benign prostatic hyperplasia) 05/04/2014   Myrbetriq per Dr. Lenna Gilford for overactive bladder symptoms then came off and did not recur Also saw palmetto.  Nocturia 0-1x a night   . Cerumen impaction 03/26/2012  . COLONIC POLYPS 04/24/2007   04/2011 adenoma x3- 5 year repeat    . Depression, major, single episode, moderate ([place]) 03/27/2017  . Deviated septum   . Diverticulosis of colon   . Diverticulosis of large intestine 04/24/2007   Qualifier: Diagnosis of  By: Lenna Gilford MD, Deborra Medina   . DJD (degenerative joint disease)   . GAD (generalized anxiety disorder) 03/27/2017  . History of adenomatous polyp of colon 06/13/2016   2.2018. 5 year?  Marland Kitchen Hx of colonic polyps   . Hypercholesteremia   . Hypertension   . MYALGIA 07/29/2007   Qualifier: Diagnosis of  By: Royal Piedra NP, Tammy    . NECK PAIN 04/27/2010   Qualifier: Diagnosis of  By: Lenna Gilford MD, Deborra Medina   . Obesity   . Osteoarthritis 04/22/2007   Cervical (turns whole body at times), hands in past. No rx.     . Prostatitis   . PROSTATITIS, HX OF 04/22/2007   Qualifier: Diagnosis of  By: Julien Girt CMA, Leigh    . RENAL CALCULUS 04/22/2007   x3, [facility] doctor released him  . Sinusitis, acute 03/26/2012   Past Surgical History:  Procedure Laterality Date  . anal fistula repair     in 90s-outpatient repair  . colles fracture left wrist after fall  11/2007  . COLONOSCOPY     2013  . SEPTOPLASTY N/A 05/08/2015   Procedure:  NASAL SEPTOPLASTY;  Surgeon: Leta Baptist, MD;  Location: [facility];  Service: ENT;  Laterality: N/A;  . SINUS ENDO WITH FUSION Bilateral 05/08/2015   Procedure: ENDOSCOPIC BILATERAL  ETHMOIDECTOMY, BILATERAL MAXILLARY ANTROSTOMY, BILATERAL SPHENOIDECTOMY, BILATERAL  FRONTAL RECESS EXPLORATION WITH FUSION NAVIGATION;  Surgeon: Leta Baptist, MD;  Location: [facility];  Service: ENT;  Laterality: Bilateral;   Family History  Problem Relation Age of Onset  . Pancreatic cancer Father   . Hypertension Mother   . Aneurysm Mother        brain died at 16  . Colon cancer Neg Hx    Social History   Socioeconomic History  . Marital status: Married    Spouse name: cora x  40 yrs  . Number of children: 3  . Years of education: Not on file  . Highest education level: Not on file  Occupational History  . 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 Financial resource strain: Not on file  . Food insecurity:    Worry: Not on file    Inability: Not on file  . Transportation needs:    Medical: Not on file    Non-medical: Not on file  Tobacco Use  . Smoking status: Current Some Day Smoker    Types: Cigars  . Smokeless tobacco: Never Used  . Tobacco comment: 1-2 per week of cigars only after retirement  Substance and Sexual Activity  . Alcohol use: No    Alcohol/week: 0.0 oz  . Drug use: No  . Sexual activity: Not on file  Lifestyle  . Physical activity:    Days per week: Not on file    Minutes per session: Not on file  . Stress: Not on file  Relationships  . Social connections:    Talks on phone: Not on file    Gets together: Not on file    Attends religious service: Not on file    Active member of club or organization: Not on file    Attends meetings of clubs or organizations: Not on file    Relationship status: Not on file  Other Topics Concern  . Not on file  Social History Narrative   Married (wife pt elsewhere). 3 children, 2 from current wife. 4 grandkids. No greatgrandkids.       Farms-raises cattle. Retired from truck driving.       Hobbies: works on old cars (22 thunderbird), travel   Tobacco Counseling Ready to quit: Not Answered Counseling given: Not Answered Comment: 1-2 per week of cigars only after retirement  Activities of Daily Living No flowsheet data found.   Immunizations and Health Maintenance Immunization History  Administered Date(s) Administered  . Influenza Split 03/08/2011, 03/24/2012  . Influenza Whole 04/27/2010  . Influenza, High Dose Seasonal PF 03/07/2014  . Influenza,inj,Quad PF,6+ Mos 01/26/2015  . Influenza,trivalent, recombinat, inj,  PF 02/09/2017  . Pneumococcal Polysaccharide-23 04/24/2007  . Td 04/24/2007  . Zoster 09/11/2015   Health Maintenance Due  Topic Date Due  . Samul Dada  04/23/2017    Patient Care Team: Marin Olp, MD as PCP - General (Family Medicine)  Indicate any recent Medical Services you may have received from other than [facility] providers in the past year (date may be approximate).    Assessment:   This is a routine wellness examination for Samuel Mendez.  Hearing/Vision screen No exam data present  Dietary issues and exercise activities discussed:  Goals    None     Depression Screen PHQ 2/9 Scores 03/27/2017 09/11/2015 08/22/2014  PHQ - 2 Score 4 0 0  PHQ- 9 Score 16 - -    Fall Risk Fall Risk  09/11/2015 08/22/2014  Falls in the past year? No No   Cognitive Function:        Screening Tests Health Maintenance  Topic Date Due  . TETANUS/TDAP  04/23/2017  . PNA vac Low Risk Adult (2 of 2 - PCV13) 08/22/2019 (Originally 04/23/2008)  . INFLUENZA VACCINE  11/20/2017  . COLONOSCOPY  06/06/2021      Plan:   Follow up with PCP as directed.  I have personally reviewed and noted the following in the patient's chart:   . Medical and social history . Use of alcohol, tobacco or illicit drugs  . Current medications and supplements . Functional ability and status . Nutritional status . Physical activity . Advanced directives . List of other physicians . Vitals . Screenings to include cognitive, depression, and falls . Referrals and appointments  In addition, I have reviewed and discussed with patient certain preventive protocols, quality metrics, and best practice recommendations. A written personalized care plan for preventive services as well as general preventive health recommendations were provided to patient.     Williemae Area, RN   08/07/2017

## 2017-08-07 NOTE — Progress Notes (Deleted)
PCP notes:   Health maintenance: Tdap:  Abnormal screenings:    Patient concerns:    Nurse concerns:   Next PCP appt:

## 2017-08-11 ENCOUNTER — Encounter: Payer: Medicare Other | Admitting: Family Medicine

## 2017-08-11 ENCOUNTER — Ambulatory Visit: Payer: Medicare HMO | Admitting: *Deleted

## 2017-08-15 ENCOUNTER — Ambulatory Visit (INDEPENDENT_AMBULATORY_CARE_PROVIDER_SITE_OTHER): Payer: Medicare HMO | Admitting: Family Medicine

## 2017-08-15 ENCOUNTER — Encounter: Payer: Self-pay | Admitting: Family Medicine

## 2017-08-15 DIAGNOSIS — F32A Depression, unspecified: Secondary | ICD-10-CM

## 2017-08-15 DIAGNOSIS — J309 Allergic rhinitis, unspecified: Secondary | ICD-10-CM | POA: Insufficient documentation

## 2017-08-15 DIAGNOSIS — F419 Anxiety disorder, unspecified: Secondary | ICD-10-CM | POA: Diagnosis not present

## 2017-08-15 DIAGNOSIS — I10 Essential (primary) hypertension: Secondary | ICD-10-CM

## 2017-08-15 DIAGNOSIS — J301 Allergic rhinitis due to pollen: Secondary | ICD-10-CM | POA: Diagnosis not present

## 2017-08-15 DIAGNOSIS — F329 Major depressive disorder, single episode, unspecified: Secondary | ICD-10-CM | POA: Diagnosis not present

## 2017-08-15 DIAGNOSIS — E7800 Pure hypercholesterolemia, unspecified: Secondary | ICD-10-CM

## 2017-08-15 DIAGNOSIS — R69 Illness, unspecified: Secondary | ICD-10-CM | POA: Diagnosis not present

## 2017-08-15 MED ORDER — FLUTICASONE PROPIONATE 50 MCG/ACT NA SUSP: 2.0000 | Freq: Every day | NASAL | 3 refills | Status: DC

## 2017-08-15 NOTE — Assessment & Plan Note (Signed)
S: patient started on lexapro 5mg  back in december. PHQ9 today of 2- fantastic improvement. Doing counseling about every 3 months now because things have improved so much. GAD7 of 0 for anxiety. Anxiety and depression controlled A/P: continue current medications

## 2017-08-15 NOTE — Assessment & Plan Note (Signed)
S: controlled on  benazepril-hctz 20-25mg  BP Readings from Last 3 Encounters:  08/15/17 136/88  05/15/17 136/88  05/13/17 (!) 190/88  A/P: We discussed blood pressure goal of <140/90. Continue current meds

## 2017-08-15 NOTE — Progress Notes (Signed)
Subjective:  Samuel Mendez is a 77 y.o. year old very pleasant male patient who presents for/with See problem oriented charting ROS- some fullness In ears, some cough, some drainage in throat particularly in AM. No depressed mood. No SI. Anxiety much improved   Past Medical History-  Patient Active Problem List   Diagnosis Date Noted  . Depression, major, single episode, moderate ([place]) 03/27/2017    Priority: High  . GAD (generalized anxiety disorder) 03/27/2017    Priority: Medium  . History of adenomatous polyp of colon 06/13/2016    Priority: Medium  . BPH (benign prostatic hyperplasia) 05/04/2014    Priority: Medium  . Anxiety and depression 04/30/2012    Priority: Medium  . HYPERCHOLESTEROLEMIA 04/24/2007    Priority: Medium  . Essential hypertension 04/22/2007    Priority: Medium  . Allergic rhinitis 08/15/2017    Priority: Low  . Anemia, unspecified 09/11/2015    Priority: Low  . Obesity 05/01/2011    Priority: Low  . COLONIC POLYPS 04/24/2007    Priority: Low  . Osteoarthritis 04/22/2007    Priority: Low    Medications- reviewed and updated Current Outpatient Medications  Medication Sig Dispense Refill  . aspirin EC 81 MG tablet Take 81 mg by mouth daily.    Marland Kitchen atorvastatin (LIPITOR) 20 MG tablet Take 1 tablet (20 mg total) by mouth daily. 90 tablet 1  . benazepril-hydrochlorthiazide (LOTENSIN HCT) 20-25 MG tablet Take 1 tablet by mouth daily. 90 tablet 3  . escitalopram (LEXAPRO) 5 MG tablet Take 1 tablet (5 mg total) by mouth daily. 30 tablet 5  . Multiple Vitamins-Minerals (MULTIVITAMIN ADULT PO) Take by mouth.    Marland Kitchen OVER THE COUNTER MEDICATION Take 450 mg by mouth daily. Saw Palmetto     No current facility-administered medications for this visit.     Objective: BP 136/88 (BP Location: Left Arm, Patient Position: Sitting, Cuff Size: Normal)   Pulse (!) 50   Temp (!) 97.5 F (36.4 C) (Oral)   Ht 6' (1.829 m)   Wt 224 lb 6.4 oz (101.8 kg)   SpO2 95%    BMI 30.43 kg/m  Gen: NAD, resting comfortably TM obscured on left side.  CV:  Slightly bradycardic but regular no murmurs rubs or gallops Lungs: CTAB no crackles, wheeze, rhonchi Abdomen: soft/nontender/nondistended/normal bowel sounds.  Ext: no edema Skin: warm, dry Neuro: normal gait and speech  Assessment/Plan:  Other notes: 1.TETANUS/TDAP - pt reports that this was done 3-4 years ago. Our team will try to track this down at [facility] in [place]. If you havent had this done since 2009 which is in our chart- would be good to go to your pharmacy to update this.    Anxiety and depression S: patient started on lexapro 5mg  back in december. PHQ9 today of 2- fantastic improvement. Doing counseling about every 3 months now because things have improved so much. GAD7 of 0 for anxiety. Anxiety and depression controlled A/P: continue current medications  Essential hypertension S: controlled on  benazepril-hctz 20-25mg  BP Readings from Last 3 Encounters:  08/15/17 136/88  05/15/17 136/88  05/13/17 (!) 190/88  A/P: We discussed blood pressure goal of <140/90. Continue current meds  HYPERCHOLESTEROLEMIA S:  controlled on atorvastatin 20mg  with LDL of 44 in October.   A/P: continue current medicines  Allergic rhinitis S: recent cough that seems to come for a few hours before resolving. Feels like there is something in his throat. Feels pressure in his ears.  He quit smoking  in 2019 had only been a light cigar smoker though.   Going on for a few months. No glaucoma. He asks about allergies A/P: sounds like allergic rhinitis. we will trial flonase- sounds like having postnasal drip and associated cough.   Return in about 6 months (around 02/14/2018) for physical. See avs about ears.   Meds ordered this encounter  Medications  . fluticasone (FLONASE) 50 MCG/ACT nasal spray    Sig: Place 2 sprays into both nostrils daily.    Dispense:  16 g    Refill:  3    Return precautions  advised.  Garret Reddish, MD

## 2017-08-15 NOTE — Patient Instructions (Addendum)
Health Maintenance Due  Topic Date Due  . TETANUS/TDAP - pt reports that this was done 3-4 years ago. Our team will [facility] to [facility] at [facility] [place]. If you havent had this done since 2009 which is in our chart- would be good to go to your pharmacy to update this.  04/23/2017   Try flonase for 3-4 weeks - may stop if symptoms resolve completely. If this doesn't help- return to see [place] and we can reevaluate and/or try some other options.   We will irrigate your left ear . There is a slight risk of perforation of the membrane- though we have never had that happen at this office. If we are unable to get the wax out- I want you to try mineral oil 2-3 drops 2-3x a day for up to 5 days- that should help clear it out.

## 2017-08-15 NOTE — Assessment & Plan Note (Signed)
S:  controlled on atorvastatin 20mg  with LDL of 44 in October.   A/P: continue current medicines

## 2017-08-15 NOTE — Assessment & Plan Note (Signed)
S: recent cough that seems to come for a few hours before resolving. Feels like there is something in his throat. Feels pressure in his ears.  He quit smoking in 2019 had only been a light cigar smoker though.   Going on for a few months. No glaucoma. He asks about allergies A/P: sounds like allergic rhinitis. we will trial flonase- sounds like having postnasal drip and associated cough.

## 2017-10-09 DIAGNOSIS — X32XXXA Exposure to sunlight, initial encounter: Secondary | ICD-10-CM | POA: Diagnosis not present

## 2017-10-09 DIAGNOSIS — L570 Actinic keratosis: Secondary | ICD-10-CM | POA: Diagnosis not present

## 2017-11-09 ENCOUNTER — Other Ambulatory Visit: Payer: Self-pay | Admitting: Family Medicine

## 2018-01-05 ENCOUNTER — Other Ambulatory Visit: Payer: Self-pay | Admitting: Physician Assistant

## 2018-02-16 ENCOUNTER — Encounter: Payer: Medicare HMO | Admitting: Family Medicine

## 2018-02-20 ENCOUNTER — Ambulatory Visit (INDEPENDENT_AMBULATORY_CARE_PROVIDER_SITE_OTHER): Payer: Medicare HMO | Admitting: Family Medicine

## 2018-02-20 ENCOUNTER — Encounter: Payer: Self-pay | Admitting: Family Medicine

## 2018-02-20 VITALS — BP 132/68 | HR 53 | Temp 97.9°F | Ht 72.0 in | Wt 225.2 lb

## 2018-02-20 DIAGNOSIS — Z0000 Encounter for general adult medical examination without abnormal findings: Secondary | ICD-10-CM | POA: Diagnosis not present

## 2018-02-20 DIAGNOSIS — I10 Essential (primary) hypertension: Secondary | ICD-10-CM

## 2018-02-20 DIAGNOSIS — J301 Allergic rhinitis due to pollen: Secondary | ICD-10-CM

## 2018-02-20 DIAGNOSIS — F411 Generalized anxiety disorder: Secondary | ICD-10-CM

## 2018-02-20 DIAGNOSIS — R351 Nocturia: Secondary | ICD-10-CM | POA: Diagnosis not present

## 2018-02-20 DIAGNOSIS — E7800 Pure hypercholesterolemia, unspecified: Secondary | ICD-10-CM

## 2018-02-20 DIAGNOSIS — F321 Major depressive disorder, single episode, moderate: Secondary | ICD-10-CM

## 2018-02-20 DIAGNOSIS — R69 Illness, unspecified: Secondary | ICD-10-CM | POA: Diagnosis not present

## 2018-02-20 LAB — CBC
HEMATOCRIT: 39.9 % (ref 39.0–52.0)
Hemoglobin: 13.2 g/dL (ref 13.0–17.0)
MCHC: 33 g/dL (ref 30.0–36.0)
MCV: 89.9 fl (ref 78.0–100.0)
Platelets: 186 10*3/uL (ref 150.0–400.0)
RBC: 4.44 Mil/uL (ref 4.22–5.81)
RDW: 13.2 % (ref 11.5–15.5)
WBC: 6.5 10*3/uL (ref 4.0–10.5)

## 2018-02-20 LAB — LIPID PANEL
CHOLESTEROL: 184 mg/dL (ref 0–200)
HDL: 45.7 mg/dL (ref >39.00)
LDL Cholesterol: 109 mg/dL — ABNORMAL HIGH (ref 0–99)
NONHDL: 138.1
Total CHOL/HDL Ratio: 4
Triglycerides: 144 mg/dL (ref 0.0–149.0)
VLDL: 28.8 mg/dL (ref 0.0–40.0)

## 2018-02-20 LAB — COMPREHENSIVE METABOLIC PANEL
ALBUMIN: 4.2 g/dL (ref 3.5–5.2)
ALK PHOS: 52 U/L (ref 39–117)
ALT: 13 U/L (ref 0–53)
AST: 17 U/L (ref 0–37)
BUN: 19 mg/dL (ref 6–23)
CO2: 29 mEq/L (ref 19–32)
CREATININE: 1.22 mg/dL (ref 0.40–1.50)
Calcium: 9.7 mg/dL (ref 8.4–10.5)
Chloride: 105 mEq/L (ref 96–112)
GFR: 74.11 mL/min (ref >60.00)
Glucose, Bld: 99 mg/dL (ref 70–99)
Potassium: 4.3 mEq/L (ref 3.5–5.1)
SODIUM: 140 meq/L (ref 135–145)
TOTAL PROTEIN: 6.8 g/dL (ref 6.0–8.3)
Total Bilirubin: 0.8 mg/dL (ref 0.2–1.2)

## 2018-02-20 LAB — URINALYSIS
BILIRUBIN URINE: NEGATIVE
HGB URINE DIPSTICK: NEGATIVE
Ketones, ur: NEGATIVE
Leukocytes, UA: NEGATIVE
NITRITE: NEGATIVE
Specific Gravity, Urine: 1.015 (ref 1.000–1.030)
Total Protein, Urine: NEGATIVE
URINE GLUCOSE: NEGATIVE
UROBILINOGEN UA: 0.2 (ref 0.0–1.0)
pH: 6 (ref 5.0–8.0)

## 2018-02-20 MED ORDER — ESCITALOPRAM OXALATE 5 MG PO TABS: 5.0000 mg | ORAL_TABLET | Freq: Every day | ORAL | 3 refills | Status: DC

## 2018-02-20 MED ORDER — ATORVASTATIN CALCIUM 20 MG PO TABS: 20.0000 mg | ORAL_TABLET | Freq: Every day | ORAL | 3 refills | Status: DC

## 2018-02-20 MED ORDER — BENAZEPRIL-HYDROCHLOROTHIAZIDE 20-25 MG PO TABS: 1.0000 | ORAL_TABLET | Freq: Every day | ORAL | 3 refills | Status: DC

## 2018-02-20 MED ORDER — FLUTICASONE PROPIONATE 50 MCG/ACT NA SUSP: 2.0000 | Freq: Every day | NASAL | 3 refills | Status: DC

## 2018-02-20 NOTE — Assessment & Plan Note (Addendum)
S:Major depression single episode moderate in the past-now in full remission on lexapro 5mg . Prior notes mention counseling- he doesn't mention that today A/P: Stable, continue current medications

## 2018-02-20 NOTE — Assessment & Plan Note (Addendum)
Hyperlipidemia-controlled on atorvastatin 20 mg last year.  Update lipids.  Chest pain evaluation earlier this year- reassuring stress test.  Normal ejection fraction.  This was coordinated by Dr. Marlou Porch  Discussed that his age he can stop aspirin- hed prefer to remain on it.

## 2018-02-20 NOTE — Progress Notes (Signed)
Phone: [phone]  Subjective:  Patient presents today for their annual physical. Chief complaint-noted.   See problem oriented charting- ROS- full  review of systems was completed and negative except for: ear pain in left ear occasionally, some cough. Still with some mild upper chest pain- every since he ate a very hot sweet potato last year- usually after food- never with exertion. maalox resolves issues. Urinary frequency. Some eye discomfort on left- following up with optho  The following were reviewed and entered/updated in epic: Past Medical History:  Diagnosis Date  . Anemia, unspecified 09/11/2015   Anemia is due to giving platelets so frequently- we discussed slowing this down.    Marland Kitchen Anxiety and depression 04/30/2012   Comes and goes, never on medication   . BPH (benign prostatic hyperplasia) 05/04/2014   Myrbetriq per Dr. Lenna Gilford for overactive bladder symptoms then came off and did not recur Also saw palmetto. Nocturia 0-1x a night   . Cerumen impaction 03/26/2012  . COLONIC POLYPS 04/24/2007   04/2011 adenoma x3- 5 year repeat    . Depression, major, single episode, moderate ([place]) 03/27/2017  . Deviated septum   . Diverticulosis of colon   . Diverticulosis of large intestine 04/24/2007   Qualifier: Diagnosis of  By: Lenna Gilford MD, Deborra Medina   . DJD (degenerative joint disease)   . GAD (generalized anxiety disorder) 03/27/2017  . History of adenomatous polyp of colon 06/13/2016   2.2018. 5 year?  Marland Kitchen Hx of colonic polyps   . Hypercholesteremia   . Hypertension   . MYALGIA 07/29/2007   Qualifier: Diagnosis of  By: Royal Piedra NP, Tammy    . NECK PAIN 04/27/2010   Qualifier: Diagnosis of  By: Lenna Gilford MD, Deborra Medina   . Obesity   . Osteoarthritis 04/22/2007   Cervical (turns whole body at times), hands in past. No rx.     . Prostatitis   . PROSTATITIS, HX OF 04/22/2007   Qualifier: Diagnosis of  By: Julien Girt CMA, Leigh    . RENAL CALCULUS 04/22/2007   x3, [place] doctor released him  . Sinusitis,  acute 03/26/2012   Patient Active Problem List   Diagnosis Date Noted  . Depression, major, single episode, moderate ([place]) 03/27/2017    Priority: High  . GAD (generalized anxiety disorder) 03/27/2017    Priority: Medium  . History of adenomatous polyp of colon 06/13/2016    Priority: Medium  . BPH (benign prostatic hyperplasia) 05/04/2014    Priority: Medium  . HYPERCHOLESTEROLEMIA 04/24/2007    Priority: Medium  . Essential hypertension 04/22/2007    Priority: Medium  . Allergic rhinitis 08/15/2017    Priority: Low  . Anemia, unspecified 09/11/2015    Priority: Low  . Obesity 05/01/2011    Priority: Low  . COLONIC POLYPS 04/24/2007    Priority: Low  . Osteoarthritis 04/22/2007    Priority: Low   Past Surgical History:  Procedure Laterality Date  . anal fistula repair     in 90s-outpatient repair  . colles fracture left wrist after fall  11/2007  . COLONOSCOPY     2013  . SEPTOPLASTY N/A 05/08/2015   Procedure: NASAL SEPTOPLASTY;  Surgeon: Leta Baptist, MD;  Location: [facility];  Service: ENT;  Laterality: N/A;  . SINUS ENDO WITH FUSION Bilateral 05/08/2015   Procedure: ENDOSCOPIC BILATERAL  ETHMOIDECTOMY, BILATERAL MAXILLARY ANTROSTOMY, BILATERAL SPHENOIDECTOMY, BILATERAL  FRONTAL RECESS EXPLORATION WITH FUSION NAVIGATION;  Surgeon: Leta Baptist, MD;  Location: [facility];  Service: ENT;  Laterality: Bilateral;    Family History  Problem Relation Age of Onset  . Pancreatic cancer Father   . Hypertension Mother   . Aneurysm Mother        brain died at 67  . Colon cancer Neg Hx     Medications- reviewed and updated Current Outpatient Medications  Medication Sig Dispense Refill  . aspirin EC 81 MG tablet Take 81 mg by mouth daily.    Marland Kitchen atorvastatin (LIPITOR) 20 MG tablet Take 1 tablet (20 mg total) by mouth daily. 90 tablet 3  . benazepril-hydrochlorthiazide (LOTENSIN HCT) 20-25 MG tablet Take 1 tablet by mouth daily. 90 tablet 3  . escitalopram  (LEXAPRO) 5 MG tablet Take 1 tablet (5 mg total) by mouth daily. 90 tablet 3  . fluticasone (FLONASE) 50 MCG/ACT nasal spray Place 2 sprays into both nostrils daily. 48 g 3  . Multiple Vitamins-Minerals (MULTIVITAMIN ADULT PO) Take by mouth.    Marland Kitchen OVER THE COUNTER MEDICATION Take 450 mg by mouth daily. Saw Palmetto     No current facility-administered medications for this visit.     Allergies-reviewed and updated No Known Allergies  Social History   Social History Narrative   Married (wife pt elsewhere). 3 children, 2 from current wife. 4 grandkids. No greatgrandkids.       Farms-raises cattle. Retired from truck driving.       Hobbies: TV, travel   Objective: BP 132/68 (BP Location: Left Arm, Patient Position: Sitting, Cuff Size: Large)   Pulse (!) 53   Temp 97.9 F (36.6 C) (Oral)   Ht 6' (1.829 m)   Wt 225 lb 3.2 oz (102.2 kg)   SpO2 95%   BMI 30.54 kg/m  Gen: NAD, resting comfortably HEENT: Mucous membranes are moist. Oropharynx normal Neck: no thyromegaly CV: RRR no murmurs rubs or gallops Lungs: CTAB no crackles, wheeze, rhonchi Abdomen: soft/nontender/nondistended/normal bowel sounds. No rebound or guarding.  Ext: no edema Skin: warm, dry Neuro: grossly normal, moves all extremities, PERRLA  Assessment/Plan:  77 y.o. male presenting for annual physical.  Health Maintenance counseling: 1. Anticipatory guidance: Patient counseled regarding regular dental exams -q6 months, eye exams -yearly, wearing seatbelts.  2. Risk factor reduction:  Advised patient of need for regular exercise and diet rich and fruits and vegetables to reduce risk of heart attack and stroke. Exercise- staying active but regular intentional exercise- encouraged 150 mins a week to get HR up (he isnt strongly interested). Diet-he admits to overeating and states eats a lot of junk/sweets- he would like to get weight down- creeping up this year- encouraged him to reverse this trend.  Wt Readings from  Last 3 Encounters:  02/20/18 225 lb 3.2 oz (102.2 kg)  08/15/17 224 lb 6.4 oz (101.8 kg)  05/15/17 220 lb 3.2 oz (99.9 kg)  3. Immunizations/screenings/ancillary studies-advised Tdap at pharmacy -he thinks he had this 3 to 4 years ago at [facility] in [place].  Our team tried to investigate last visit and were not able to get records. Still discussed could update at pharmacy- asked him to update [place] if he does that. Also discussed shingrix  Immunization History  Administered Date(s) Administered  . Influenza Split 03/08/2011, 03/24/2012  . Influenza Whole 04/27/2010  . Influenza, High Dose Seasonal PF 03/07/2014  . Influenza,inj,Quad PF,6+ Mos 01/26/2015, 01/24/2018  . Influenza,trivalent, recombinat, inj, PF 02/09/2017  . Pneumococcal Polysaccharide-23 04/24/2007  . Td 04/24/2007  . Zoster 09/11/2015  4. Prostate cancer screening- enlarged prostate history- nocturia about twice  a night- stable for a few years  Lab Results  Component Value Date   PSA 0.75 09/06/2015   PSA 0.71 05/04/2014   PSA 0.61 04/30/2012   5. Colon cancer screening - history of adenomatous polyp in February 2018 with five-year follow-up recommended.  Status of chronic or acute concerns    Depression, major, single episode, moderate ([facility]) S:Major depression single episode moderate in the past-now in full remission on lexapro 5mg . Prior notes mention counseling- he doesn't mention that today A/P: Stable, continue current medications   GAD (generalized anxiety disorder) Anxiety-remains on Lexapro 5 mg.  PHQ 9 and GAD-7 both at or under 5.  Continue current medication.   HYPERCHOLESTEROLEMIA Hyperlipidemia-controlled on atorvastatin 20 mg last year.  Update lipids.  Chest pain evaluation earlier this year- reassuring stress test.  Normal ejection fraction.  This was coordinated by Dr. Marlou Porch  Discussed that his age he can stop aspirin- hed prefer to remain on it.    Allergic rhinitis Allergic rhinitis- we  trialed Flonase at last visit due to postnasal drip and associated cough- he states had some issues getting this filled.  We refilled to pharmacy  Essential hypertension Hypertension-controlled on benazepril hydrochlorothiazide 20-25 mg  He declines 6 month BP recheck Return in about 1 year (around 02/21/2019) for physical.  Lab/Order associations: NOT fasting Preventative health care  Essential hypertension - Plan: benazepril-hydrochlorthiazide (LOTENSIN HCT) 20-25 MG tablet, CBC, Comprehensive metabolic panel, Lipid panel  HYPERCHOLESTEROLEMIA - Plan: CBC, Comprehensive metabolic panel, Lipid panel  Nocturia - Plan: Urinalysis, Urinalysis  Depression, major, single episode, moderate ([facility])  GAD (generalized anxiety disorder)  Seasonal allergic rhinitis due to pollen  Meds ordered this encounter  Medications  . escitalopram (LEXAPRO) 5 MG tablet    Sig: Take 1 tablet (5 mg total) by mouth daily.    Dispense:  90 tablet    Refill:  3    Please consider 90 day supplies to promote better adherence  . fluticasone (FLONASE) 50 MCG/ACT nasal spray    Sig: Place 2 sprays into both nostrils daily.    Dispense:  48 g    Refill:  3  . benazepril-hydrochlorthiazide (LOTENSIN HCT) 20-25 MG tablet    Sig: Take 1 tablet by mouth daily.    Dispense:  90 tablet    Refill:  3  . atorvastatin (LIPITOR) 20 MG tablet    Sig: Take 1 tablet (20 mg total) by mouth daily.    Dispense:  90 tablet    Refill:  3    Return precautions advised.  Garret Reddish, MD

## 2018-02-20 NOTE — Assessment & Plan Note (Signed)
Anxiety-remains on Lexapro 5 mg.  PHQ 9 and GAD-7 both at or under 5.  Continue current medication.

## 2018-02-20 NOTE — Patient Instructions (Addendum)
Please check with your pharmacy to see if they have the shingrix vaccine as well as Tdap/tetanus shot. If they do- please get these immunizations and update [place] by phone call or mychart with dates you receive the vaccine  No changes today other than trying flonase for those allergies  Please stop by lab before you go

## 2018-02-20 NOTE — Assessment & Plan Note (Signed)
Allergic rhinitis- we trialed Flonase at last visit due to postnasal drip and associated cough- he states had some issues getting this filled.  We refilled to pharmacy

## 2018-02-20 NOTE — Assessment & Plan Note (Signed)
Hypertension-controlled on benazepril hydrochlorothiazide 20-25 mg

## 2018-03-16 DIAGNOSIS — I10 Essential (primary) hypertension: Secondary | ICD-10-CM | POA: Diagnosis not present

## 2018-03-16 DIAGNOSIS — H2510 Age-related nuclear cataract, unspecified eye: Secondary | ICD-10-CM | POA: Diagnosis not present

## 2018-03-16 DIAGNOSIS — H5200 Hypermetropia, unspecified eye: Secondary | ICD-10-CM | POA: Diagnosis not present

## 2018-04-03 DIAGNOSIS — H52209 Unspecified astigmatism, unspecified eye: Secondary | ICD-10-CM | POA: Diagnosis not present

## 2018-04-03 DIAGNOSIS — H524 Presbyopia: Secondary | ICD-10-CM | POA: Diagnosis not present

## 2018-04-03 DIAGNOSIS — H5203 Hypermetropia, bilateral: Secondary | ICD-10-CM | POA: Diagnosis not present

## 2018-04-30 ENCOUNTER — Other Ambulatory Visit: Payer: Self-pay

## 2018-04-30 ENCOUNTER — Telehealth: Payer: Self-pay | Admitting: Family Medicine

## 2018-04-30 DIAGNOSIS — I10 Essential (primary) hypertension: Secondary | ICD-10-CM

## 2018-04-30 MED ORDER — BENAZEPRIL-HYDROCHLOROTHIAZIDE 20-25 MG PO TABS: 1.0000 | ORAL_TABLET | Freq: Every day | ORAL | 3 refills | Status: DC

## 2018-04-30 MED ORDER — ESCITALOPRAM OXALATE 5 MG PO TABS: 5.0000 mg | ORAL_TABLET | Freq: Every day | ORAL | 3 refills | Status: DC

## 2018-04-30 MED ORDER — ATORVASTATIN CALCIUM 20 MG PO TABS: 20.0000 mg | ORAL_TABLET | Freq: Every day | ORAL | 3 refills | Status: DC

## 2018-04-30 NOTE — Telephone Encounter (Signed)
MEDICATION:  benazepril-hydrochlorthiazide (LOTENSIN HCT) 20-25 MG tablet [[id number]]  atorvastatin (LIPITOR) 20 MG tablet [[id number]]  escitalopram (LEXAPRO) 5 MG tablet [[id number]]      PHARMACY:  [place], [place] [address] [place] [address] [place] [address] [phone] (Phone) [phone] (Fax)      IS THIS A 90 DAY SUPPLY : Y  IS PATIENT OUT OF MEDICATION: Y for Atorvastatin N for Benazepril  IF NOT; HOW MUCH IS LEFT:   LAST APPOINTMENT DATE: @11 /04/2017  NEXT APPOINTMENT DATE:@Visit  date not found  OTHER COMMENTS: Pt stated he was told dosage of Atorvastatin was going to be increased. Pt also stated he is paying $50 for escitalopram and was given names for other brands to try. Citalopram or serpraline   **Let patient know to contact pharmacy at the end of the day to make sure medication is ready. **  ** Please notify patient to allow 48-72 hours to process**  **Encourage patient to contact the pharmacy for refills or they can request refills through [facility]**

## 2018-04-30 NOTE — Telephone Encounter (Signed)
Prescriptions refilled and sent to [facility] as requested

## 2018-07-01 DIAGNOSIS — R69 Illness, unspecified: Secondary | ICD-10-CM | POA: Diagnosis not present

## 2018-10-05 ENCOUNTER — Other Ambulatory Visit: Payer: Medicare HMO

## 2018-10-05 ENCOUNTER — Other Ambulatory Visit: Payer: Self-pay

## 2018-10-05 DIAGNOSIS — R6889 Other general symptoms and signs: Secondary | ICD-10-CM | POA: Diagnosis not present

## 2018-10-05 DIAGNOSIS — Z20822 Contact with and (suspected) exposure to covid-19: Secondary | ICD-10-CM

## 2018-10-05 NOTE — Progress Notes (Signed)
[id number] 

## 2018-10-08 LAB — NOVEL CORONAVIRUS, NAA: SARS-CoV-2, NAA: NOT DETECTED

## 2018-11-23 ENCOUNTER — Telehealth: Payer: Self-pay

## 2018-11-23 MED ORDER — ESCITALOPRAM OXALATE 5 MG PO TABS: 5.0000 mg | ORAL_TABLET | Freq: Every day | ORAL | 0 refills | Status: DC

## 2018-11-23 NOTE — Addendum Note (Signed)
Addended by: Jasper Loser on: 11/23/2018 01:47 PM   Modules accepted: Orders

## 2018-11-23 NOTE — Telephone Encounter (Signed)
Called and left a message with the patient's spouse to schedule cpe. No need to route.

## 2018-11-23 NOTE — Telephone Encounter (Signed)
Pt stated he let his rx for escitalopram (LEXAPRO) 5 MG tablet sit at the pharmacy too long and they put it back. Pt stated he needs this sent in again to  [place], [place] - [address] (Phone) [phone] (Fax)

## 2018-11-23 NOTE — Telephone Encounter (Addendum)
Last OV 02/20/2018 f/u 1 yr Last refill 04/30/18 #90/3 Next OV not scheduled  Rx refilled x 90 days. Please contact pt to schedule CPE after 02/21/19.

## 2018-11-24 ENCOUNTER — Ambulatory Visit: Payer: Medicare HMO | Admitting: Family Medicine

## 2018-12-22 DIAGNOSIS — R69 Illness, unspecified: Secondary | ICD-10-CM | POA: Diagnosis not present

## 2019-01-02 DIAGNOSIS — R69 Illness, unspecified: Secondary | ICD-10-CM | POA: Diagnosis not present

## 2019-04-01 ENCOUNTER — Ambulatory Visit (INDEPENDENT_AMBULATORY_CARE_PROVIDER_SITE_OTHER): Payer: Medicare HMO | Admitting: Family Medicine

## 2019-04-01 ENCOUNTER — Other Ambulatory Visit: Payer: Self-pay

## 2019-04-01 ENCOUNTER — Encounter: Payer: Self-pay | Admitting: Family Medicine

## 2019-04-01 VITALS — BP 148/82 | HR 53 | Temp 99.4°F | Ht 70.5 in | Wt 220.0 lb

## 2019-04-01 DIAGNOSIS — F411 Generalized anxiety disorder: Secondary | ICD-10-CM

## 2019-04-01 DIAGNOSIS — I10 Essential (primary) hypertension: Secondary | ICD-10-CM | POA: Diagnosis not present

## 2019-04-01 DIAGNOSIS — F321 Major depressive disorder, single episode, moderate: Secondary | ICD-10-CM | POA: Diagnosis not present

## 2019-04-01 DIAGNOSIS — Z86010 Personal history of colonic polyps: Secondary | ICD-10-CM

## 2019-04-01 DIAGNOSIS — K219 Gastro-esophageal reflux disease without esophagitis: Secondary | ICD-10-CM | POA: Insufficient documentation

## 2019-04-01 DIAGNOSIS — E7800 Pure hypercholesterolemia, unspecified: Secondary | ICD-10-CM | POA: Diagnosis not present

## 2019-04-01 DIAGNOSIS — Z0000 Encounter for general adult medical examination without abnormal findings: Secondary | ICD-10-CM | POA: Diagnosis not present

## 2019-04-01 DIAGNOSIS — R69 Illness, unspecified: Secondary | ICD-10-CM | POA: Diagnosis not present

## 2019-04-01 DIAGNOSIS — N401 Enlarged prostate with lower urinary tract symptoms: Secondary | ICD-10-CM | POA: Diagnosis not present

## 2019-04-01 DIAGNOSIS — D649 Anemia, unspecified: Secondary | ICD-10-CM | POA: Diagnosis not present

## 2019-04-01 LAB — POC URINALSYSI DIPSTICK (AUTOMATED)
Bilirubin, UA: NEGATIVE
Blood, UA: NEGATIVE
Glucose, UA: NEGATIVE
Leukocytes, UA: NEGATIVE
Nitrite, UA: NEGATIVE
Protein, UA: NEGATIVE
Spec Grav, UA: 1.025 (ref 1.010–1.025)
Urobilinogen, UA: 0.2 E.U./dL
pH, UA: 6 (ref 5.0–8.0)

## 2019-04-01 MED ORDER — FAMOTIDINE 20 MG PO TABS: 20.0000 mg | ORAL_TABLET | Freq: Two times a day (BID) | ORAL | 5 refills | Status: DC

## 2019-04-01 MED ORDER — BENAZEPRIL-HYDROCHLOROTHIAZIDE 20-25 MG PO TABS: 1.0000 | ORAL_TABLET | Freq: Every day | ORAL | 3 refills | Status: DC

## 2019-04-01 MED ORDER — CITALOPRAM HYDROBROMIDE 10 MG PO TABS: 10.0000 mg | ORAL_TABLET | Freq: Every day | ORAL | 3 refills | Status: DC

## 2019-04-01 MED ORDER — ATORVASTATIN CALCIUM 20 MG PO TABS: 20.0000 mg | ORAL_TABLET | Freq: Every day | ORAL | 3 refills | Status: DC

## 2019-04-01 NOTE — Assessment & Plan Note (Signed)
Depression, major, single episode, moderate- full remission on lexapro 5 mg. Also elping with  GAD (generalized anxiety disorder). Continue current medicine.  - unfortunately it has become costly. Apparently celexa will be $0 so we opted to try this instead.

## 2019-04-01 NOTE — Progress Notes (Signed)
Phone: [phone]   Subjective:  Patient presents today for their annual physical. Chief complaint-noted.   See problem oriented charting- Review of Systems  Constitutional: Negative.   HENT: Negative.   Eyes: Negative.   Respiratory: Negative.   Cardiovascular: Negative.   Gastrointestinal: Positive for heartburn (right side chest pain/indigestion/relief with Tums).  Genitourinary: Positive for urgency.  Musculoskeletal: Negative.   Skin: Negative.   Neurological: Negative.   Endo/Heme/Allergies: Negative.   Psychiatric/Behavioral: Negative.    The following were reviewed and entered/updated in epic: Past Medical History:  Diagnosis Date  . Anemia, unspecified 09/11/2015   Anemia is due to giving platelets so frequently- we discussed slowing this down.    Marland Kitchen Anxiety and depression 04/30/2012   Comes and goes, never on medication   . BPH (benign prostatic hyperplasia) 05/04/2014   Myrbetriq per Dr. Lenna Gilford for overactive bladder symptoms then came off and did not recur Also saw palmetto. Nocturia 0-1x a night   . Cerumen impaction 03/26/2012  . COLONIC POLYPS 04/24/2007   04/2011 adenoma x3- 5 year repeat    . Depression, major, single episode, moderate ([place]) 03/27/2017  . Deviated septum   . Diverticulosis of colon   . Diverticulosis of large intestine 04/24/2007   Qualifier: Diagnosis of  By: Lenna Gilford MD, Deborra Medina   . DJD (degenerative joint disease)   . GAD (generalized anxiety disorder) 03/27/2017  . History of adenomatous polyp of colon 06/13/2016   2.2018. 5 year?  Marland Kitchen Hx of colonic polyps   . Hypercholesteremia   . Hypertension   . MYALGIA 07/29/2007   Qualifier: Diagnosis of  By: Royal Piedra NP, Tammy    . NECK PAIN 04/27/2010   Qualifier: Diagnosis of  By: Lenna Gilford MD, Deborra Medina   . Obesity   . Osteoarthritis 04/22/2007   Cervical (turns whole body at times), hands in past. No rx.     . Prostatitis   . PROSTATITIS, HX OF 04/22/2007   Qualifier: Diagnosis of  By: Julien Girt CMA, Leigh    .  RENAL CALCULUS 04/22/2007   x3, [place] doctor released him  . Sinusitis, acute 03/26/2012   Patient Active Problem List   Diagnosis Date Noted  . Depression, major, single episode, moderate ([place]) 03/27/2017    Priority: High  . GAD (generalized anxiety disorder) 03/27/2017    Priority: Medium  . History of adenomatous polyp of colon 06/13/2016    Priority: Medium  . BPH (benign prostatic hyperplasia) 05/04/2014    Priority: Medium  . HYPERCHOLESTEROLEMIA 04/24/2007    Priority: Medium  . Essential hypertension 04/22/2007    Priority: Medium  . Allergic rhinitis 08/15/2017    Priority: Low  . Anemia, unspecified 09/11/2015    Priority: Low  . Obesity 05/01/2011    Priority: Low  . COLONIC POLYPS 04/24/2007    Priority: Low  . Osteoarthritis 04/22/2007    Priority: Low  . GERD (gastroesophageal reflux disease) 04/01/2019   Past Surgical History:  Procedure Laterality Date  . anal fistula repair     in 90s-outpatient repair  . colles fracture left wrist after fall  11/2007  . COLONOSCOPY     2013  . SEPTOPLASTY N/A 05/08/2015   Procedure: NASAL SEPTOPLASTY;  Surgeon: Leta Baptist, MD;  Location: [facility];  Service: ENT;  Laterality: N/A;  . SINUS ENDO WITH FUSION Bilateral 05/08/2015   Procedure: ENDOSCOPIC BILATERAL  ETHMOIDECTOMY, BILATERAL MAXILLARY ANTROSTOMY, BILATERAL SPHENOIDECTOMY, BILATERAL  FRONTAL RECESS EXPLORATION WITH FUSION NAVIGATION;  Surgeon: Leta Baptist, MD;  Location: [facility];  Service: ENT;  Laterality: Bilateral;    Family History  Problem Relation Age of Onset  . Pancreatic cancer Father   . Hypertension Mother   . Aneurysm Mother        brain died at 85  . Colon cancer Neg Hx     Medications- reviewed and updated Current Outpatient Medications  Medication Sig Dispense Refill  . Ascorbic Acid (VITAMIN C) 1000 MG tablet Take 1,000 mg by mouth daily.    Marland Kitchen aspirin EC 81 MG tablet Take 81 mg by mouth daily.    Marland Kitchen  atorvastatin (LIPITOR) 20 MG tablet Take 1 tablet (20 mg total) by mouth daily. 90 tablet 3  . benazepril-hydrochlorthiazide (LOTENSIN HCT) 20-25 MG tablet Take 1 tablet by mouth daily. 90 tablet 3  . calcium-vitamin D (OSCAL WITH D) 500-200 MG-UNIT tablet Take 1 tablet by mouth.    . Cholecalciferol (VITAMIN D3) 25 MCG (1000 UT) CAPS Take by mouth.    Marland Kitchen OVER THE COUNTER MEDICATION Take 450 mg by mouth daily. Saw Palmetto    . OVER THE COUNTER MEDICATION     . citalopram (CELEXA) 10 MG tablet Take 1 tablet (10 mg total) by mouth daily. 90 tablet 3  . famotidine (PEPCID) 20 MG tablet Take 1 tablet (20 mg total) by mouth 2 (two) times daily. 60 tablet 5  . Multiple Vitamins-Minerals (MULTIVITAMIN ADULT PO) Take by mouth.     No current facility-administered medications for this visit.    Allergies-reviewed and updated No Known Allergies  Social History   Social History Narrative   Married (wife pt elsewhere). 3 children, 2 from current wife. 4 grandkids. 1 greatgrandson 01/2019 - in [place]. Retired from truck driving.       Hobbies: TV, travel   Objective  Objective:  BP (!) 148/82   Pulse (!) 53   Temp 99.4 F (37.4 C) (Temporal)   Ht 5' 10.5" (1.791 m)   Wt 220 lb (99.8 kg)   SpO2 93%   BMI 31.12 kg/m  Gen: NAD, resting comfortably HEENT: Mask not removed due to covid 19. TM normal. Bridge of nose normal. Eyelids normal.  Neck: no thyromegaly or cervical lymphadenopathy  CV: RRR no murmurs rubs or gallops Lungs: CTAB no crackles, wheeze, rhonchi Abdomen: soft/nontender/nondistended/normal bowel sounds. No rebound or guarding.  Ext: no edema Skin: warm, dry Neuro: grossly normal, moves all extremities, PERRLA   Assessment and Plan  78 y.o. male presenting for annual physical.  Health Maintenance counseling: 1. Anticipatory guidance: Patient counseled regarding regular dental exams q6 months, eye exams yearly, avoiding smoking and second  hand smoke- very rare cigar encouraged him to stop , limiting alcohol to 2 beverages per day.  No alchol  2. Risk factor reduction:  Advised patient of need for regular exercise and diet rich and fruits and vegetables to reduce risk of heart attack and stroke. Exercise- farmer/exercises daily and very active. Diet- tries to eat healthier options. 5 lbs down from last year- good progress.  Wt Readings from Last 3 Encounters:  04/01/19 220 lb (99.8 kg)  02/20/18 225 lb 3.2 oz (102.2 kg)  08/15/17 224 lb 6.4 oz (101.8 kg)  3. Immunizations/screenings/ancillary studies- discussed Tdap and shingrix at pharmacy or [place] Immunization History  Administered Date(s) Administered  . Influenza Split 03/08/2011, 03/24/2012  . Influenza Whole 04/27/2010  . Influenza, High Dose Seasonal PF 03/07/2014, 01/02/2019  . Influenza,inj,Quad PF,6+  Mos 01/26/2015, 01/24/2018  . Influenza,trivalent, recombinat, inj, PF 02/09/2017  . Pneumococcal Polysaccharide-23 04/24/2007  . Td 04/24/2007  . Zoster 09/11/2015  4. Prostate cancer screening- nocturia 1-3x a night- usually depends on amount of water before bed. BPH on exam in past. Pees about every 2 hours in the day.  Lab Results  Component Value Date   PSA 0.75 09/06/2015   PSA 0.71 05/04/2014   PSA 0.61 04/30/2012   5. Colon cancer screening - due on 06/06/2021. Had in February 2018 with history History of adenomatous polyp of colon 6. Skin cancer screening- lower risk due to melanin content- had a spot removed from his face last year with dermatologist.  advised regular sunscreen use. Denies worrisome, changing, or new skin lesions.  7. Current cigar smoker- 1 -2 per week. Get UA but not in age range for other screenings. Strongly encouraged cessation.   Status of chronic or acute concerns   Essential hypertension - compliant with benazepril hctz 20-25 mg daily. Initial BP high but improved slightly on repeat- this is first year it has been high- he agrees to  home monitoring and will update [place] with BPs in 2 weeks- consider changing to 20-12.5 mg and take two pills daily.  BP Readings from Last 3 Encounters:  04/01/19 (!) 148/82  02/20/18 132/68  08/15/17 136/88   HYPERCHOLESTEROLEMIA - update lipid panel today. On atorvastatin 20 mg daily. He prefers to remain on aspirin.   Depression, major, single episode, moderate- full remission on lexapro 5 mg. Also elping with  GAD (generalized anxiety disorder). Continue current medicine.  - unfortunately it has become costly. Apparently celexa will be $0 so we opted to try this instead.   Benign prostatic hyperplasia with lower urinary tract symptoms, symptom details unspecified. Stable will monitor Lab Results  Component Value Date   WBC 6.5 02/20/2018   HGB 13.2 02/20/2018   HCT 39.9 02/20/2018   MCV 89.9 02/20/2018   PLT 186.0 02/20/2018   Anemia- gives platelets/blood pregularly- will check CBC  GERD- occasionally getting upper chest indigestion sensation. Clears with tums and has to take most days. He would prefer not to get the reflux in the first place.  Will try pepcid instead given regular use of tums- he wants something preventative. If not effective asked him to let me know -reassuring stress test in 2019 with Dr. Marlou Porch  Recommended follow up: Return in about 1 year (around 03/31/2020) for physical or sooner if needed.  Lab/Order associations  fasting   ICD-10-CM   1. Preventative health care  Z00.00 POCT Urinalysis Dipstick (Automated)    CBC with Differential/Platelet    Comprehensive metabolic panel    Lipid panel  2. Essential hypertension  I10 POCT Urinalysis Dipstick (Automated)    CBC with Differential/Platelet    Comprehensive metabolic panel    Lipid panel    benazepril-hydrochlorthiazide (LOTENSIN HCT) 20-25 MG tablet  3. HYPERCHOLESTEROLEMIA  E78.00 POCT Urinalysis Dipstick (Automated)    CBC with Differential/Platelet    Comprehensive metabolic panel    Lipid panel   4. Depression, major, single episode, moderate ([facility])  F32.1   5. GAD (generalized anxiety disorder)  F41.1   6. History of adenomatous polyp of colon  Z86.010   7. Benign prostatic hyperplasia with lower urinary tract symptoms, symptom details unspecified  N40.1   8. Anemia, unspecified type  D64.9   9. Gastroesophageal reflux disease without esophagitis  K21.9     Meds ordered this encounter  Medications  .  citalopram (CELEXA) 10 MG tablet    Sig: Take 1 tablet (10 mg total) by mouth daily.    Dispense:  90 tablet    Refill:  3  . famotidine (PEPCID) 20 MG tablet    Sig: Take 1 tablet (20 mg total) by mouth 2 (two) times daily.    Dispense:  60 tablet    Refill:  5  . atorvastatin (LIPITOR) 20 MG tablet    Sig: Take 1 tablet (20 mg total) by mouth daily.    Dispense:  90 tablet    Refill:  3  . benazepril-hydrochlorthiazide (LOTENSIN HCT) 20-25 MG tablet    Sig: Take 1 tablet by mouth daily.    Dispense:  90 tablet    Refill:  3    Return precautions advised.  Garret Reddish, MD

## 2019-04-01 NOTE — Progress Notes (Signed)
Phone: [phone]    Subjective:   Patient presents today for their annual wellness visit.    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 0 0 No No No  Number falls in past yr: 0 - - - -  Injury with Fall? 0 - - - -  Opioid use history:  no long term opioids use  Cardiac risk factors:  advanced age (older than 27 for men, 30 for women)  treated Hyperlipidemia  treated Hypertension  No diabetes.  Lab Results  Component Value Date   HGBA1C 4.8 04/24/2007  Family History: no CAD   Depression Screen History of depression on medications long-term. PHQ9 of 0- good control of depression but need to switch meds- see cpe Depression screen [facility] 2/9 04/01/2019 02/20/2018 08/15/2017 08/15/2017 03/27/2017  Decreased Interest 0 0 0 0 2  Down, Depressed, Hopeless 0 0 0 0 2  PHQ - 2 Score 0 0 0 0 4  Altered sleeping 0 0 0 - 1  Tired, decreased energy 0 1 1 - 2  Change in appetite 0 3 0 - 1  Feeling bad or failure about yourself  0 0 0 - 2  Trouble concentrating 0 1 1 - 3  Moving slowly or fidgety/restless 0 0 0 - 2  Suicidal thoughts 0 0 0 - 1  PHQ-9 Score 0 5 2 - 16  Difficult doing work/chores Not difficult at all Not difficult at all Not difficult at all - Somewhat difficult    Activities of Daily Living Independent ADLs (toileting, bathing, dressing, transferring, eating)  and IADLs (shopping, housekeeping, managing own medications, and handling finances)  Hearing Difficulties: -patient declines  Cognitive Testing             No reported trouble.   Mini cog: normal clock draw. 3/3 delayed recall. Normal test result   banana sunrise chair   List the Names of Other Physician/Practitioners you currently use: Patient Care Team: Marin Olp, MD as PCP - General (Family Medicine) Jerline Pain, MD as Consulting Physician (Cardiology) Also follows with the [facility]  Immunization History  Administered Date(s) Administered  . Influenza Split 03/08/2011, 03/24/2012  . Influenza Whole 04/27/2010  . Influenza, High Dose Seasonal PF 03/07/2014, 01/02/2019  . Influenza,inj,Quad PF,6+ Mos 01/26/2015, 01/24/2018  . Influenza,trivalent, recombinat, inj, PF 02/09/2017  . Pneumococcal Polysaccharide-23 04/24/2007  . Td 04/24/2007  . Zoster 09/11/2015   Required Immunizations needed today -Tdap and shingrix at pharmacy recommended Health Maintenance  Topic Date Due  . Tetanus Vaccine  04/23/2017  . Pneumonia vaccines (2 of 2 - PCV13) 08/22/2019*  . Colon Cancer Screening  06/06/2021  . Flu Shot  Completed  *Topic was postponed. The date shown is not the original due date.   Screening tests- up to date.  1. Colon cancer screening- planned 2023 2. Lung Cancer screening- not a candidate-  below 30 pack years 3. Skin cancer screening- has seen dermatology in the past. Lower risk due to melanin content 4. Prostate cancer screening- low risk prior PSA trend- we have discontinued screenings Lab Results  Component Value Date   PSA 0.75 09/06/2015   PSA 0.71 05/04/2014   PSA 0.61 04/30/2012   ROS- No pertinent positives discovered in course of AWV  The following were reviewed and entered/updated in epic: Past Medical History:  Diagnosis Date  . Anemia, unspecified 09/11/2015   Anemia is due to giving platelets so frequently- we discussed slowing this down.    Marland Kitchen  Anxiety and depression 04/30/2012   Comes and goes, never on medication   . BPH (benign prostatic hyperplasia) 05/04/2014   Myrbetriq per Dr. Lenna Gilford for overactive bladder symptoms then came off and did not recur Also saw palmetto. Nocturia 0-1x a night   . Cerumen impaction 03/26/2012  . COLONIC POLYPS 04/24/2007   04/2011 adenoma x3- 5 year repeat    . Depression, major, single episode, moderate ([place]) 03/27/2017  . Deviated septum   . Diverticulosis of colon   . Diverticulosis of large intestine 04/24/2007   Qualifier: Diagnosis of  By: Lenna Gilford MD, Deborra Medina   . DJD (degenerative joint disease)   . GAD (generalized anxiety disorder) 03/27/2017  . History of adenomatous polyp of colon 06/13/2016   2.2018. 5 year?  Marland Kitchen Hx of colonic polyps   . Hypercholesteremia   . Hypertension   . MYALGIA 07/29/2007   Qualifier: Diagnosis of  By: Royal Piedra NP, Tammy    . NECK PAIN 04/27/2010   Qualifier: Diagnosis of  By: Lenna Gilford MD, Deborra Medina   . Obesity   . Osteoarthritis 04/22/2007   Cervical (turns whole body at times), hands in past. No rx.     . Prostatitis   . PROSTATITIS, HX OF 04/22/2007   Qualifier: Diagnosis of  By: Julien Girt CMA, Leigh    . RENAL CALCULUS 04/22/2007   x3, [place] doctor released him  . Sinusitis, acute 03/26/2012   Patient Active Problem List   Diagnosis Date Noted  . Depression, major, single episode, moderate ([place]) 03/27/2017    Priority: High  . GAD (generalized anxiety disorder) 03/27/2017    Priority: Medium  . History of adenomatous polyp of colon 06/13/2016    Priority: Medium  . BPH (benign prostatic hyperplasia) 05/04/2014    Priority: Medium  . HYPERCHOLESTEROLEMIA 04/24/2007    Priority: Medium  . Essential hypertension 04/22/2007    Priority: Medium  . Allergic rhinitis 08/15/2017    Priority: Low  . Anemia, unspecified 09/11/2015    Priority: Low  . Obesity 05/01/2011    Priority: Low  . COLONIC POLYPS 04/24/2007    Priority: Low  . Osteoarthritis 04/22/2007     Priority: Low  . GERD (gastroesophageal reflux disease) 04/01/2019   Past Surgical History:  Procedure Laterality Date  . anal fistula repair     in 90s-outpatient repair  . colles fracture left wrist after fall  11/2007  . COLONOSCOPY     2013  . SEPTOPLASTY N/A 05/08/2015   Procedure: NASAL SEPTOPLASTY;  Surgeon: Leta Baptist, MD;  Location: [facility];  Service: ENT;  Laterality: N/A;  . SINUS ENDO WITH FUSION Bilateral 05/08/2015   Procedure: ENDOSCOPIC BILATERAL  ETHMOIDECTOMY, BILATERAL MAXILLARY ANTROSTOMY, BILATERAL SPHENOIDECTOMY, BILATERAL  FRONTAL RECESS EXPLORATION WITH FUSION NAVIGATION;  Surgeon: Leta Baptist, MD;  Location: [facility];  Service: ENT;  Laterality: Bilateral;  Family History  Problem Relation Age of Onset  . Pancreatic cancer Father   . Hypertension Mother   . Aneurysm Mother        brain died at 7  . Colon cancer Neg Hx     Medications- reviewed and updated Current Outpatient Medications  Medication Sig Dispense Refill  . Ascorbic Acid (VITAMIN C) 1000 MG tablet Take 1,000 mg by mouth daily.    Marland Kitchen aspirin EC 81 MG tablet Take 81 mg by mouth daily.    Marland Kitchen atorvastatin (LIPITOR) 20 MG tablet Take 1 tablet (20 mg total) by mouth daily. 90 tablet 3  . benazepril-hydrochlorthiazide (LOTENSIN HCT) 20-25 MG tablet Take 1 tablet by mouth daily. 90 tablet 3  . calcium-vitamin D (OSCAL WITH D) 500-200 MG-UNIT tablet Take 1 tablet by mouth.    . Cholecalciferol (VITAMIN D3) 25 MCG (1000 UT) CAPS Take by mouth.    Marland Kitchen OVER THE COUNTER MEDICATION Take 450 mg by mouth daily. Saw Palmetto    . OVER THE COUNTER MEDICATION     . citalopram (CELEXA) 10 MG tablet Take 1 tablet (10 mg total) by mouth daily. 90 tablet 3  . famotidine (PEPCID) 20 MG tablet Take 1 tablet (20 mg total) by mouth 2 (two) times daily. 60 tablet 5  . Multiple Vitamins-Minerals (MULTIVITAMIN ADULT PO) Take by mouth.     No current facility-administered medications for this  visit.    Allergies-reviewed and updated No Known Allergies  Social History   Socioeconomic History  . Marital status: Married    Spouse name: cora x 40 yrs  . Number of children: 3  . Years of education: Not on file  . Highest education level: Not on file  Occupational History  . Occupation: retired from truck driving in [id number]  . 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 Smoking status: Former Smoker    Types: Cigars    Quit date: 05/06/2017    Years since quitting: 1.9  . Smokeless tobacco: Never Used  . Tobacco comment: 1-2 per week of cigars only after retirement  Substance and Sexual Activity  . Alcohol use: No    Alcohol/week: 0.0 standard drinks  . Drug use: No  . Sexual activity: Not on file  Other Topics Concern  . Not on file  Social History Narrative   Married (wife pt elsewhere). 3 children, 2 from current wife. 4 grandkids. No greatgrandkids.       Farms-raises cattle. Retired from truck driving.       Hobbies: TV, travel   Social Determinants of Health   Financial Resource Strain:   . Difficulty of Paying Living Expenses: Not on file  Food Insecurity:   . Worried About Charity fundraiser in the Last Year: Not on file  . Ran Out of Food in the Last Year: Not on file  Transportation Needs:   . Lack of Transportation (Medical): Not on file  . Lack of Transportation (Non-Medical): Not on file  Physical Activity:   . Days of Exercise per Week: Not on file  . Minutes of Exercise per Session: Not on file  Stress:   . Feeling of Stress : Not on file  Social Connections:   . Frequency of Communication with Friends and Family: Not on file  . Frequency of Social Gatherings with Friends and Family: Not on file  . Attends Religious Services: Not on file  . Active Member of Clubs or Organizations: Not on file  .  Attends Archivist Meetings: Not on file  . Marital Status: Not on file      Objective:  BP (!) 148/82   Pulse (!) 53    Temp 99.4 F (37.4 C) (Temporal)   Ht 5' 10.5" (1.791 m)   Wt 220 lb (99.8 kg)   SpO2 93%   BMI 31.12 kg/m  See CPE    Assessment/Plan:  AWV completed- discussed recommended screenings and documented any personalized health advice and referrals for preventive counseling. See AVS as well which was given to patient.   Status of chronic or acute concerns  See CPE  Recommended follow up: Return in about 1 year (around 03/31/2020) for physical or sooner if needed.   Lab/Order associations:    1. Preventative health care    Return precautions advised. Garret Reddish, MD

## 2019-04-01 NOTE — Addendum Note (Signed)
Addended by: Tomi Likens on: 04/01/2019 05:23 PM   Modules accepted: Orders

## 2019-04-01 NOTE — Assessment & Plan Note (Signed)
GERD- occasionally getting upper chest indigestion sensation. Clears with tums and has to take most days. He would prefer not to get the reflux in the first place.  Will try pepcid instead given regular use of tums- he wants something preventative -reassuring stress test in 2019 with Dr. Marlou Porch

## 2019-04-01 NOTE — Patient Instructions (Addendum)
Health Maintenance Due  Topic Date Due  . TETANUS/TDAP - consider getting this updated at your pharmacy especially with how active you are  Please check with your pharmacy to see if they have the shingrix vaccine. If they do- please get this immunization and update [place] by phone call or mychart with dates you receive the vaccine 04/23/2017   Apparently celexa 10mg  will be $0 so we opted to try this instead of lexapro 5 mg- you can switch on the first day you get new pill- if symptoms worsen let me know.   Lets try pepcid before breakfast and dinner to reduce reflux. If this works- can try it just once a day (works for about 12 hours so before dinner or breakfast is fine). If it doesn't work please see [place] back.   Blood pressure slightly high today at 148/82. Would prefer 138/88 or less. You agreed to monitor at home and update me with blood pressures for next 2 weeks in about 2 weeks- if remains high we may need to tweak medicine and see each othe rback- can call us or drop off a copy of the blood pressures.   Recommended follow [id number] in about 1 year (around 03/31/2020) for physical or sooner if needed. would love to see you in 6 months to recheck blood pressure (as long as home #s look better and we dont need to see you sooner)

## 2019-04-02 LAB — CBC WITH DIFFERENTIAL/PLATELET
Basophils Absolute: 0 10*3/uL (ref 0.0–0.1)
Basophils Relative: 0.6 % (ref 0.0–3.0)
Eosinophils Absolute: 0.1 10*3/uL (ref 0.0–0.7)
Eosinophils Relative: 0.8 % (ref 0.0–5.0)
HCT: 38.8 % — ABNORMAL LOW (ref 39.0–52.0)
Hemoglobin: 12.8 g/dL — ABNORMAL LOW (ref 13.0–17.0)
Lymphocytes Relative: 13.6 % (ref 12.0–46.0)
Lymphs Abs: 1 10*3/uL (ref 0.7–4.0)
MCHC: 33.1 g/dL (ref 30.0–36.0)
MCV: 90.7 fl (ref 78.0–100.0)
Monocytes Absolute: 0.7 10*3/uL (ref 0.1–1.0)
Monocytes Relative: 8.9 % (ref 3.0–12.0)
Neutro Abs: 5.6 10*3/uL (ref 1.4–7.7)
Neutrophils Relative %: 76.1 % (ref 43.0–77.0)
Platelets: 176 10*3/uL (ref 150.0–400.0)
RBC: 4.28 Mil/uL (ref 4.22–5.81)
RDW: 12.9 % (ref 11.5–15.5)
WBC: 7.3 10*3/uL (ref 4.0–10.5)

## 2019-04-02 LAB — COMPREHENSIVE METABOLIC PANEL
ALT: 20 U/L (ref 0–53)
AST: 24 U/L (ref 0–37)
Albumin: 4.3 g/dL (ref 3.5–5.2)
Alkaline Phosphatase: 53 U/L (ref 39–117)
BUN: 19 mg/dL (ref 6–23)
CO2: 28 mEq/L (ref 19–32)
Calcium: 9.7 mg/dL (ref 8.4–10.5)
Chloride: 105 mEq/L (ref 96–112)
Creatinine, Ser: 1.3 mg/dL (ref 0.40–1.50)
GFR: 64.61 mL/min (ref >60.00)
Glucose, Bld: 97 mg/dL (ref 70–99)
Potassium: 4 mEq/L (ref 3.5–5.1)
Sodium: 140 mEq/L (ref 135–145)
Total Bilirubin: 0.9 mg/dL (ref 0.2–1.2)
Total Protein: 7.2 g/dL (ref 6.0–8.3)

## 2019-04-02 LAB — LIPID PANEL
Cholesterol: 130 mg/dL (ref 0–200)
HDL: 53.7 mg/dL (ref >39.00)
LDL Cholesterol: 62 mg/dL (ref 0–99)
NonHDL: 76.1
Total CHOL/HDL Ratio: 2
Triglycerides: 69 mg/dL (ref 0.0–149.0)
VLDL: 13.8 mg/dL (ref 0.0–40.0)

## 2019-04-05 ENCOUNTER — Other Ambulatory Visit: Payer: Self-pay

## 2019-04-05 DIAGNOSIS — D649 Anemia, unspecified: Secondary | ICD-10-CM

## 2019-04-26 ENCOUNTER — Telehealth (INDEPENDENT_AMBULATORY_CARE_PROVIDER_SITE_OTHER): Payer: Medicare HMO | Admitting: Family Medicine

## 2019-04-26 NOTE — Progress Notes (Signed)
Patient dropped off 39 blood pressure readings-20 from the mornings and 19 from the evenings.  His average over this time was 137/69.  High blood pressure during this time was 158/71 and low blood pressure during this time was 114/71.  I will have my team call patient and thank him for dropping these off.  Given his overall averages less than 140/90 I believe we can continue with current medication-if he sees an increase at home can drop off another copy and we can reevaluate.

## 2019-04-26 NOTE — Patient Instructions (Signed)
Patient dropped off 39 blood pressure readings-20 from the mornings and 19 from the evenings.  His average over this time was 137/69.  High blood pressure during this time was 158/71 and low blood pressure during this time was 114/71.  I will have my team call patient and thank him for dropping these off.  Given his overall averages less than 140/90 I believe we can continue with current medication-if he sees an increase at home can drop off another copy and we can reevaluate.

## 2019-06-23 DIAGNOSIS — R69 Illness, unspecified: Secondary | ICD-10-CM | POA: Diagnosis not present

## 2019-07-08 ENCOUNTER — Other Ambulatory Visit: Payer: Medicare HMO

## 2019-07-12 ENCOUNTER — Other Ambulatory Visit (INDEPENDENT_AMBULATORY_CARE_PROVIDER_SITE_OTHER): Payer: Medicare HMO

## 2019-07-12 ENCOUNTER — Other Ambulatory Visit: Payer: Self-pay

## 2019-07-12 DIAGNOSIS — D649 Anemia, unspecified: Secondary | ICD-10-CM

## 2019-07-12 LAB — CBC WITH DIFFERENTIAL/PLATELET
Basophils Absolute: 0.1 10*3/uL (ref 0.0–0.1)
Basophils Relative: 1.1 % (ref 0.0–3.0)
Eosinophils Absolute: 0.1 10*3/uL (ref 0.0–0.7)
Eosinophils Relative: 1.9 % (ref 0.0–5.0)
HCT: 40.4 % (ref 39.0–52.0)
Hemoglobin: 13.5 g/dL (ref 13.0–17.0)
Lymphocytes Relative: 12.1 % (ref 12.0–46.0)
Lymphs Abs: 0.8 10*3/uL (ref 0.7–4.0)
MCHC: 33.4 g/dL (ref 30.0–36.0)
MCV: 89.7 fl (ref 78.0–100.0)
Monocytes Absolute: 0.8 10*3/uL (ref 0.1–1.0)
Monocytes Relative: 11.6 % (ref 3.0–12.0)
Neutro Abs: 5.1 10*3/uL (ref 1.4–7.7)
Neutrophils Relative %: 73.3 % (ref 43.0–77.0)
Platelets: 202 10*3/uL (ref 150.0–400.0)
RBC: 4.5 Mil/uL (ref 4.22–5.81)
RDW: 12.7 % (ref 11.5–15.5)
WBC: 6.9 10*3/uL (ref 4.0–10.5)

## 2019-08-30 DIAGNOSIS — I10 Essential (primary) hypertension: Secondary | ICD-10-CM | POA: Diagnosis not present

## 2019-08-30 DIAGNOSIS — R0789 Other chest pain: Secondary | ICD-10-CM | POA: Diagnosis not present

## 2019-08-30 DIAGNOSIS — R079 Chest pain, unspecified: Secondary | ICD-10-CM | POA: Diagnosis not present

## 2019-08-30 DIAGNOSIS — R001 Bradycardia, unspecified: Secondary | ICD-10-CM | POA: Diagnosis not present

## 2019-09-01 DIAGNOSIS — I10 Essential (primary) hypertension: Secondary | ICD-10-CM | POA: Diagnosis not present

## 2019-09-01 DIAGNOSIS — Z7982 Long term (current) use of aspirin: Secondary | ICD-10-CM | POA: Diagnosis not present

## 2019-09-01 DIAGNOSIS — E785 Hyperlipidemia, unspecified: Secondary | ICD-10-CM | POA: Diagnosis not present

## 2019-09-01 DIAGNOSIS — R0789 Other chest pain: Secondary | ICD-10-CM | POA: Diagnosis not present

## 2019-09-01 DIAGNOSIS — R001 Bradycardia, unspecified: Secondary | ICD-10-CM | POA: Diagnosis not present

## 2019-09-01 DIAGNOSIS — R079 Chest pain, unspecified: Secondary | ICD-10-CM | POA: Diagnosis not present

## 2019-09-01 DIAGNOSIS — R69 Illness, unspecified: Secondary | ICD-10-CM | POA: Diagnosis not present

## 2019-09-01 DIAGNOSIS — Z79899 Other long term (current) drug therapy: Secondary | ICD-10-CM | POA: Diagnosis not present

## 2019-10-22 DIAGNOSIS — H5203 Hypermetropia, bilateral: Secondary | ICD-10-CM | POA: Diagnosis not present

## 2019-10-22 DIAGNOSIS — H524 Presbyopia: Secondary | ICD-10-CM | POA: Diagnosis not present

## 2019-10-22 DIAGNOSIS — H52209 Unspecified astigmatism, unspecified eye: Secondary | ICD-10-CM | POA: Diagnosis not present

## 2019-10-22 DIAGNOSIS — H269 Unspecified cataract: Secondary | ICD-10-CM | POA: Diagnosis not present

## 2019-11-15 ENCOUNTER — Telehealth: Payer: Self-pay | Admitting: Family Medicine

## 2019-11-15 NOTE — Progress Notes (Signed)
  Chronic Care Management   Outreach Note  11/15/2019 Name: Samuel Mendez MRN: [id number] DOB: 1940-08-23  Referred by: Marin Olp, MD Reason for referral : No chief complaint on file.   An unsuccessful telephone outreach was attempted today. The patient was referred to the pharmacist for assistance with care management and care coordination.   Follow Up Plan:   Carley Perdue [employer] Scheduler

## 2019-11-30 ENCOUNTER — Other Ambulatory Visit: Payer: Self-pay | Admitting: Family Medicine

## 2019-12-01 ENCOUNTER — Other Ambulatory Visit: Payer: Self-pay | Admitting: Family Medicine

## 2019-12-09 ENCOUNTER — Telehealth: Payer: Self-pay | Admitting: Family Medicine

## 2019-12-09 NOTE — Progress Notes (Signed)
°  Chronic Care Management   Outreach Note  12/09/2019 Name: Samuel Mendez MRN: [id number] DOB: 1940/12/22  Referred by: Marin Olp, MD Reason for referral : No chief complaint on file.   An unsuccessful telephone outreach was attempted today. The patient was referred to the pharmacist for assistance with care management and care coordination.   Follow Up Plan:   Earney Hamburg [employer] Scheduler

## 2019-12-20 DIAGNOSIS — F4310 Post-traumatic stress disorder, unspecified: Secondary | ICD-10-CM | POA: Diagnosis not present

## 2019-12-20 DIAGNOSIS — K219 Gastro-esophageal reflux disease without esophagitis: Secondary | ICD-10-CM | POA: Diagnosis not present

## 2019-12-20 DIAGNOSIS — R69 Illness, unspecified: Secondary | ICD-10-CM | POA: Diagnosis not present

## 2019-12-20 DIAGNOSIS — F411 Generalized anxiety disorder: Secondary | ICD-10-CM | POA: Diagnosis not present

## 2019-12-20 DIAGNOSIS — I10 Essential (primary) hypertension: Secondary | ICD-10-CM | POA: Diagnosis not present

## 2019-12-20 DIAGNOSIS — Z85828 Personal history of other malignant neoplasm of skin: Secondary | ICD-10-CM | POA: Diagnosis not present

## 2019-12-20 DIAGNOSIS — G3184 Mild cognitive impairment, so stated: Secondary | ICD-10-CM | POA: Diagnosis not present

## 2019-12-20 DIAGNOSIS — Z008 Encounter for other general examination: Secondary | ICD-10-CM | POA: Diagnosis not present

## 2019-12-20 DIAGNOSIS — E785 Hyperlipidemia, unspecified: Secondary | ICD-10-CM | POA: Diagnosis not present

## 2019-12-21 ENCOUNTER — Telehealth: Payer: Self-pay | Admitting: Family Medicine

## 2019-12-21 NOTE — Progress Notes (Signed)
°  Chronic Care Management   Outreach Note  12/21/2019 Name: RAFFERTY POSTLEWAIT MRN: [id number] DOB: 1940-08-19  Referred by: Marin Olp, MD Reason for referral : No chief complaint on file.   An unsuccessful telephone outreach was attempted today. The patient was referred to the pharmacist for assistance with care management and care coordination.   Follow Up Plan:   Earney Hamburg [employer] Scheduler

## 2020-01-20 DIAGNOSIS — L820 Inflamed seborrheic keratosis: Secondary | ICD-10-CM | POA: Diagnosis not present

## 2020-01-23 DIAGNOSIS — R69 Illness, unspecified: Secondary | ICD-10-CM | POA: Diagnosis not present

## 2020-02-24 DIAGNOSIS — E785 Hyperlipidemia, unspecified: Secondary | ICD-10-CM | POA: Diagnosis not present

## 2020-02-24 DIAGNOSIS — K219 Gastro-esophageal reflux disease without esophagitis: Secondary | ICD-10-CM | POA: Diagnosis not present

## 2020-02-24 DIAGNOSIS — I10 Essential (primary) hypertension: Secondary | ICD-10-CM | POA: Diagnosis not present

## 2020-02-24 DIAGNOSIS — R69 Illness, unspecified: Secondary | ICD-10-CM | POA: Diagnosis not present

## 2020-02-24 DIAGNOSIS — Z0189 Encounter for other specified special examinations: Secondary | ICD-10-CM | POA: Diagnosis not present

## 2020-02-29 DIAGNOSIS — I10 Essential (primary) hypertension: Secondary | ICD-10-CM | POA: Diagnosis not present

## 2020-02-29 DIAGNOSIS — R972 Elevated prostate specific antigen [PSA]: Secondary | ICD-10-CM | POA: Diagnosis not present

## 2020-02-29 DIAGNOSIS — R69 Illness, unspecified: Secondary | ICD-10-CM | POA: Diagnosis not present

## 2020-02-29 DIAGNOSIS — Z0189 Encounter for other specified special examinations: Secondary | ICD-10-CM | POA: Diagnosis not present

## 2020-02-29 DIAGNOSIS — K219 Gastro-esophageal reflux disease without esophagitis: Secondary | ICD-10-CM | POA: Diagnosis not present

## 2020-02-29 DIAGNOSIS — Z712 Person consulting for explanation of examination or test findings: Secondary | ICD-10-CM | POA: Diagnosis not present

## 2020-02-29 DIAGNOSIS — E785 Hyperlipidemia, unspecified: Secondary | ICD-10-CM | POA: Diagnosis not present

## 2020-03-09 DIAGNOSIS — R69 Illness, unspecified: Secondary | ICD-10-CM | POA: Diagnosis not present

## 2020-03-09 DIAGNOSIS — I10 Essential (primary) hypertension: Secondary | ICD-10-CM | POA: Diagnosis not present

## 2020-03-09 DIAGNOSIS — K219 Gastro-esophageal reflux disease without esophagitis: Secondary | ICD-10-CM | POA: Diagnosis not present

## 2020-03-09 DIAGNOSIS — Z712 Person consulting for explanation of examination or test findings: Secondary | ICD-10-CM | POA: Diagnosis not present

## 2020-03-09 DIAGNOSIS — E785 Hyperlipidemia, unspecified: Secondary | ICD-10-CM | POA: Diagnosis not present

## 2020-06-15 DIAGNOSIS — B078 Other viral warts: Secondary | ICD-10-CM | POA: Diagnosis not present

## 2020-09-04 DIAGNOSIS — I10 Essential (primary) hypertension: Secondary | ICD-10-CM | POA: Diagnosis not present

## 2020-09-12 ENCOUNTER — Ambulatory Visit (HOSPITAL_COMMUNITY)
Admission: RE | Admit: 2020-09-12 | Discharge: 2020-09-12 | Disposition: A | Discharge status: Home / Self Care | Payer: Medicare HMO | Source: Ambulatory Visit | Attending: Family Medicine | Admitting: Family Medicine

## 2020-09-12 ENCOUNTER — Other Ambulatory Visit: Payer: Self-pay

## 2020-09-12 ENCOUNTER — Other Ambulatory Visit (HOSPITAL_COMMUNITY): Payer: Self-pay | Admitting: Family Medicine

## 2020-09-12 DIAGNOSIS — I10 Essential (primary) hypertension: Secondary | ICD-10-CM | POA: Diagnosis not present

## 2020-09-12 DIAGNOSIS — R079 Chest pain, unspecified: Secondary | ICD-10-CM | POA: Diagnosis not present

## 2020-09-12 DIAGNOSIS — E785 Hyperlipidemia, unspecified: Secondary | ICD-10-CM | POA: Diagnosis not present

## 2020-09-12 DIAGNOSIS — R69 Illness, unspecified: Secondary | ICD-10-CM | POA: Diagnosis not present

## 2020-12-04 DIAGNOSIS — H5200 Hypermetropia, unspecified eye: Secondary | ICD-10-CM | POA: Diagnosis not present

## 2020-12-20 DIAGNOSIS — H52222 Regular astigmatism, left eye: Secondary | ICD-10-CM | POA: Diagnosis not present

## 2020-12-20 DIAGNOSIS — H524 Presbyopia: Secondary | ICD-10-CM | POA: Diagnosis not present

## 2021-07-01 IMAGING — DX DG CHEST 2V
2 series · 2 of 2 positions shown · non-contrast
Comparison: 04/30/2012

CLINICAL DATA: Chest pain, unspecified type.

EXAM:
CHEST - 2 VIEW

[chest pa]
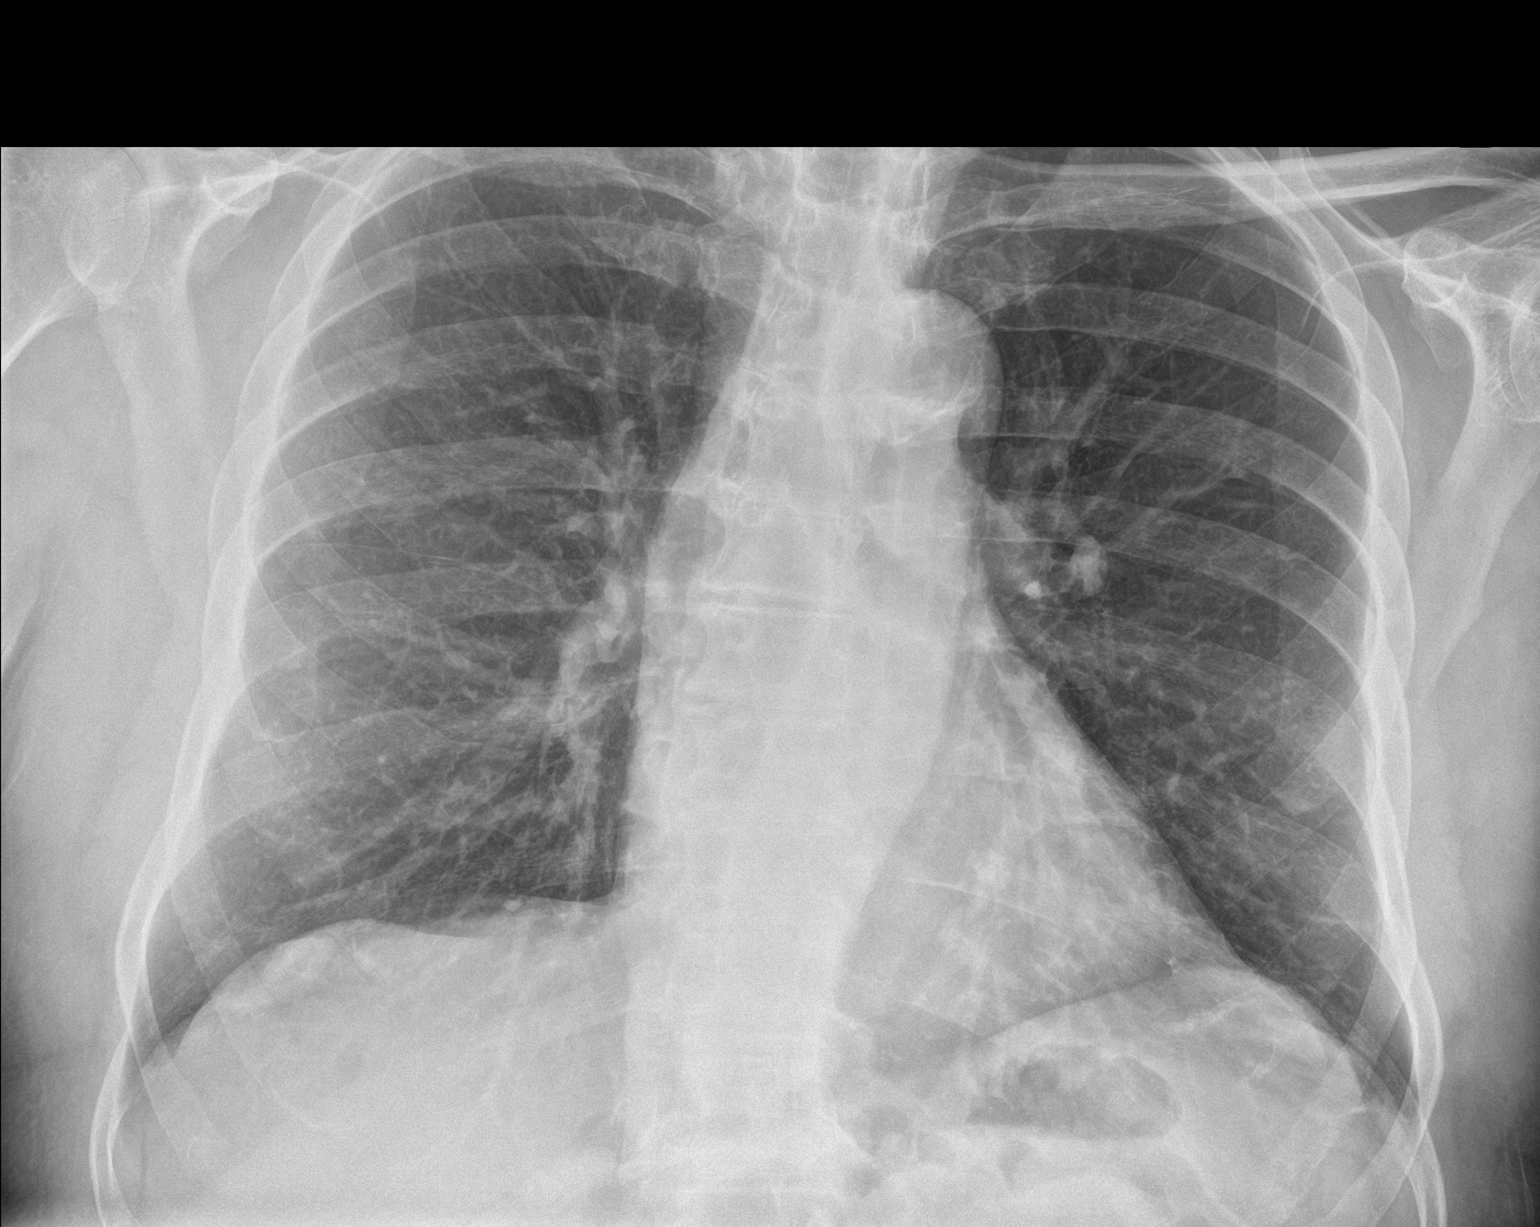

[chest lat]
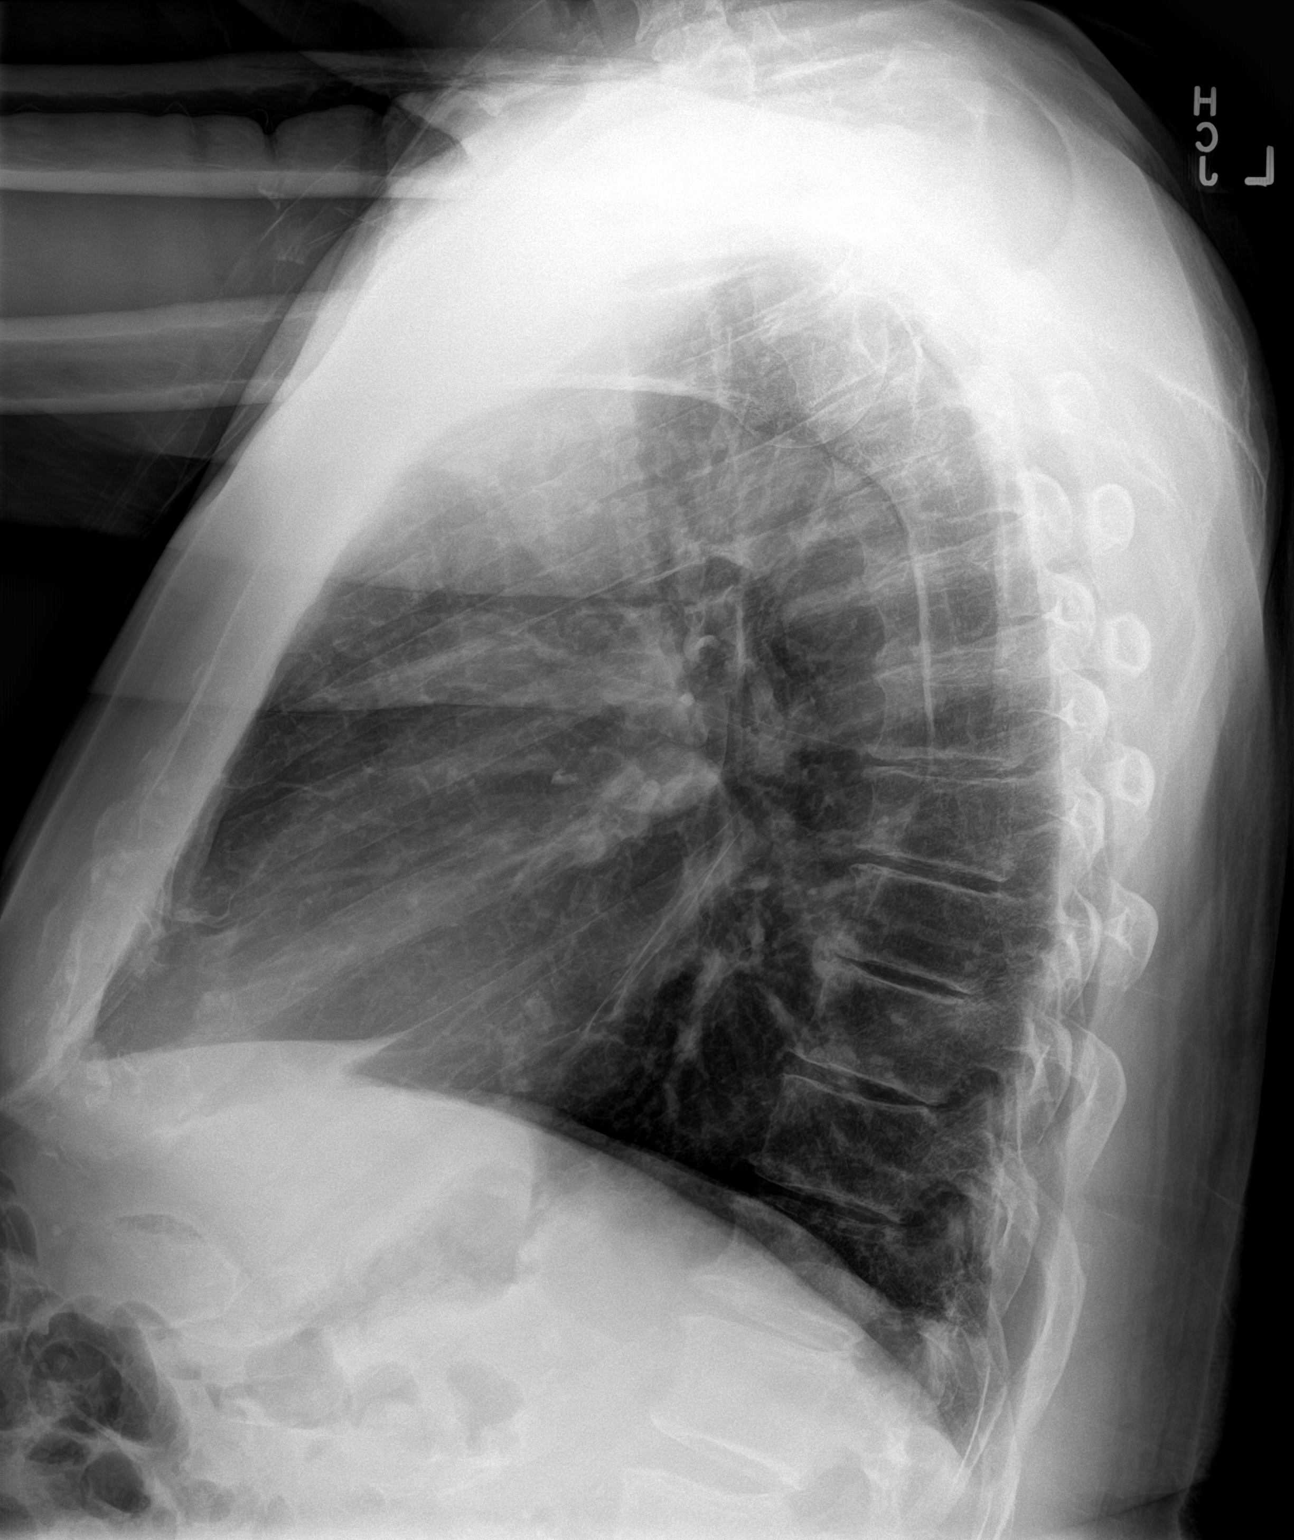

[2 of 2 positions shown; findings below may reference images not displayed]

FINDINGS: Both lungs are clear. Heart and mediastinum are within normal
limits. Atherosclerotic calcifications at the aortic arch. Trachea
is midline. No pleural effusions. Degenerative changes in the
thoracic spine.
IMPRESSION: No active cardiopulmonary disease.

## 2021-08-29 DIAGNOSIS — E782 Mixed hyperlipidemia: Secondary | ICD-10-CM | POA: Diagnosis not present

## 2021-08-29 DIAGNOSIS — Z125 Encounter for screening for malignant neoplasm of prostate: Secondary | ICD-10-CM | POA: Diagnosis not present

## 2021-08-29 DIAGNOSIS — I10 Essential (primary) hypertension: Secondary | ICD-10-CM | POA: Diagnosis not present

## 2021-09-04 DIAGNOSIS — Z0001 Encounter for general adult medical examination with abnormal findings: Secondary | ICD-10-CM | POA: Diagnosis not present

## 2021-09-04 DIAGNOSIS — K449 Diaphragmatic hernia without obstruction or gangrene: Secondary | ICD-10-CM | POA: Diagnosis not present

## 2021-09-04 DIAGNOSIS — Z1211 Encounter for screening for malignant neoplasm of colon: Secondary | ICD-10-CM | POA: Diagnosis not present

## 2021-09-04 DIAGNOSIS — H6122 Impacted cerumen, left ear: Secondary | ICD-10-CM | POA: Diagnosis not present

## 2021-09-04 DIAGNOSIS — N400 Enlarged prostate without lower urinary tract symptoms: Secondary | ICD-10-CM | POA: Diagnosis not present

## 2021-09-04 DIAGNOSIS — D6959 Other secondary thrombocytopenia: Secondary | ICD-10-CM | POA: Diagnosis not present

## 2021-09-04 DIAGNOSIS — R69 Illness, unspecified: Secondary | ICD-10-CM | POA: Diagnosis not present

## 2021-09-04 DIAGNOSIS — E785 Hyperlipidemia, unspecified: Secondary | ICD-10-CM | POA: Diagnosis not present

## 2021-09-18 DIAGNOSIS — R509 Fever, unspecified: Secondary | ICD-10-CM | POA: Diagnosis not present

## 2021-09-18 DIAGNOSIS — R69 Illness, unspecified: Secondary | ICD-10-CM | POA: Diagnosis not present

## 2021-09-18 DIAGNOSIS — R059 Cough, unspecified: Secondary | ICD-10-CM | POA: Diagnosis not present

## 2021-09-18 DIAGNOSIS — J0100 Acute maxillary sinusitis, unspecified: Secondary | ICD-10-CM | POA: Diagnosis not present

## 2021-09-18 DIAGNOSIS — J029 Acute pharyngitis, unspecified: Secondary | ICD-10-CM | POA: Diagnosis not present

## 2022-01-14 ENCOUNTER — Encounter: Payer: Self-pay | Admitting: *Deleted

## 2022-01-16 DIAGNOSIS — I10 Essential (primary) hypertension: Secondary | ICD-10-CM | POA: Diagnosis not present

## 2022-01-16 DIAGNOSIS — R0789 Other chest pain: Secondary | ICD-10-CM | POA: Diagnosis not present

## 2022-01-16 DIAGNOSIS — R9431 Abnormal electrocardiogram [ECG] [EKG]: Secondary | ICD-10-CM | POA: Diagnosis not present

## 2022-01-16 DIAGNOSIS — R001 Bradycardia, unspecified: Secondary | ICD-10-CM | POA: Diagnosis not present

## 2022-01-16 DIAGNOSIS — M25561 Pain in right knee: Secondary | ICD-10-CM | POA: Diagnosis not present

## 2022-01-25 DIAGNOSIS — M25462 Effusion, left knee: Secondary | ICD-10-CM | POA: Diagnosis not present

## 2022-01-25 DIAGNOSIS — I169 Hypertensive crisis, unspecified: Secondary | ICD-10-CM | POA: Diagnosis not present

## 2022-01-25 DIAGNOSIS — Z23 Encounter for immunization: Secondary | ICD-10-CM | POA: Diagnosis not present

## 2022-02-01 DIAGNOSIS — I10 Essential (primary) hypertension: Secondary | ICD-10-CM | POA: Diagnosis not present

## 2022-02-01 DIAGNOSIS — I169 Hypertensive crisis, unspecified: Secondary | ICD-10-CM | POA: Diagnosis not present

## 2022-02-12 DIAGNOSIS — I2510 Atherosclerotic heart disease of native coronary artery without angina pectoris: Secondary | ICD-10-CM | POA: Diagnosis not present

## 2022-02-12 DIAGNOSIS — I169 Hypertensive crisis, unspecified: Secondary | ICD-10-CM | POA: Diagnosis not present

## 2022-02-20 ENCOUNTER — Emergency Department (HOSPITAL_COMMUNITY): Payer: No Typology Code available for payment source

## 2022-02-20 ENCOUNTER — Encounter (HOSPITAL_COMMUNITY): Payer: Self-pay | Admitting: Emergency Medicine

## 2022-02-20 ENCOUNTER — Inpatient Hospital Stay (HOSPITAL_COMMUNITY)
Admission: EM | Admit: 2022-02-20 | Discharge: 2022-02-21 | DRG: 065 | Disposition: A | Discharge status: Home / Self Care | Payer: No Typology Code available for payment source | Attending: Family Medicine | Admitting: Family Medicine

## 2022-02-20 ENCOUNTER — Other Ambulatory Visit: Payer: Self-pay

## 2022-02-20 DIAGNOSIS — Z800 Family history of malignant neoplasm of digestive organs: Secondary | ICD-10-CM | POA: Diagnosis not present

## 2022-02-20 DIAGNOSIS — F411 Generalized anxiety disorder: Secondary | ICD-10-CM | POA: Diagnosis present

## 2022-02-20 DIAGNOSIS — F321 Major depressive disorder, single episode, moderate: Secondary | ICD-10-CM | POA: Diagnosis present

## 2022-02-20 DIAGNOSIS — N400 Enlarged prostate without lower urinary tract symptoms: Secondary | ICD-10-CM | POA: Diagnosis present

## 2022-02-20 DIAGNOSIS — I63541 Cerebral infarction due to unspecified occlusion or stenosis of right cerebellar artery: Principal | ICD-10-CM | POA: Diagnosis present

## 2022-02-20 DIAGNOSIS — I639 Cerebral infarction, unspecified: Secondary | ICD-10-CM | POA: Diagnosis not present

## 2022-02-20 DIAGNOSIS — E7800 Pure hypercholesterolemia, unspecified: Secondary | ICD-10-CM | POA: Diagnosis present

## 2022-02-20 DIAGNOSIS — Z86010 Personal history of colonic polyps: Secondary | ICD-10-CM | POA: Diagnosis not present

## 2022-02-20 DIAGNOSIS — I6389 Other cerebral infarction: Secondary | ICD-10-CM | POA: Diagnosis not present

## 2022-02-20 DIAGNOSIS — I10 Essential (primary) hypertension: Secondary | ICD-10-CM | POA: Diagnosis present

## 2022-02-20 DIAGNOSIS — R200 Anesthesia of skin: Secondary | ICD-10-CM

## 2022-02-20 DIAGNOSIS — R001 Bradycardia, unspecified: Secondary | ICD-10-CM | POA: Diagnosis present

## 2022-02-20 DIAGNOSIS — I209 Angina pectoris, unspecified: Secondary | ICD-10-CM | POA: Diagnosis present

## 2022-02-20 DIAGNOSIS — Z8249 Family history of ischemic heart disease and other diseases of the circulatory system: Secondary | ICD-10-CM

## 2022-02-20 DIAGNOSIS — I25118 Atherosclerotic heart disease of native coronary artery with other forms of angina pectoris: Secondary | ICD-10-CM | POA: Diagnosis present

## 2022-02-20 DIAGNOSIS — Z87891 Personal history of nicotine dependence: Secondary | ICD-10-CM

## 2022-02-20 DIAGNOSIS — Z79899 Other long term (current) drug therapy: Secondary | ICD-10-CM | POA: Diagnosis not present

## 2022-02-20 LAB — CBC WITH DIFFERENTIAL/PLATELET
Abs Immature Granulocytes: 0.01 10*3/uL (ref 0.00–0.07)
Basophils Absolute: 0 10*3/uL (ref 0.0–0.1)
Basophils Relative: 1 %
Eosinophils Absolute: 0.1 10*3/uL (ref 0.0–0.5)
Eosinophils Relative: 1 %
HCT: 40.4 % (ref 39.0–52.0)
Hemoglobin: 13.2 g/dL (ref 13.0–17.0)
Immature Granulocytes: 0 %
Lymphocytes Relative: 15 %
Lymphs Abs: 1.1 10*3/uL (ref 0.7–4.0)
MCH: 30 pg (ref 26.0–34.0)
MCHC: 32.7 g/dL (ref 30.0–36.0)
MCV: 91.8 fL (ref 80.0–100.0)
Monocytes Absolute: 0.7 10*3/uL (ref 0.1–1.0)
Monocytes Relative: 10 %
Neutro Abs: 5.2 10*3/uL (ref 1.7–7.7)
Neutrophils Relative %: 73 %
Platelets: 183 10*3/uL (ref 150–400)
RBC: 4.4 MIL/uL (ref 4.22–5.81)
RDW: 12.4 % (ref 11.5–15.5)
WBC: 7.2 10*3/uL (ref 4.0–10.5)
nRBC: 0 % (ref 0.0–0.2)

## 2022-02-20 LAB — COMPREHENSIVE METABOLIC PANEL
ALT: 17 U/L (ref 0–44)
AST: 18 U/L (ref 15–41)
Albumin: 4.2 g/dL (ref 3.5–5.0)
Alkaline Phosphatase: 52 U/L (ref 38–126)
Anion gap: 6 (ref 5–15)
BUN: 16 mg/dL (ref 8–23)
CO2: 29 mmol/L (ref 22–32)
Calcium: 9.2 mg/dL (ref 8.9–10.3)
Chloride: 104 mmol/L (ref 98–111)
Creatinine, Ser: 1.17 mg/dL (ref 0.61–1.24)
GFR, Estimated: >60 mL/min (ref >60)
Glucose, Bld: 96 mg/dL (ref 70–99)
Potassium: 3.9 mmol/L (ref 3.5–5.1)
Sodium: 139 mmol/L (ref 135–145)
Total Bilirubin: 0.8 mg/dL (ref 0.3–1.2)
Total Protein: 7.6 g/dL (ref 6.5–8.1)

## 2022-02-20 LAB — TROPONIN I (HIGH SENSITIVITY)
Troponin I (High Sensitivity): 13 ng/L (ref <18)
Troponin I (High Sensitivity): 14 ng/L (ref <18)

## 2022-02-20 LAB — TSH: TSH: 3.653 u[IU]/mL (ref 0.350–4.500)

## 2022-02-20 MED ORDER — ACETAMINOPHEN 650 MG RE SUPP: 650.0000 mg | RECTAL | Status: DC | PRN

## 2022-02-20 MED ORDER — SENNOSIDES-DOCUSATE SODIUM 8.6-50 MG PO TABS: 1.0000 | ORAL_TABLET | Freq: Every evening | ORAL | Status: DC | PRN

## 2022-02-20 MED ORDER — NITROGLYCERIN 0.3 MG SL SUBL: 0.3000 mg | SUBLINGUAL_TABLET | SUBLINGUAL | Status: DC | PRN

## 2022-02-20 MED ORDER — ASPIRIN 325 MG PO TABS
325.0000 mg | ORAL_TABLET | Freq: Every day | ORAL | Status: AC
  Administered 2022-02-20: 325 mg via ORAL
  Filled 2022-02-20: qty 1

## 2022-02-20 MED ORDER — STROKE: EARLY STAGES OF RECOVERY BOOK: Freq: Once | Status: AC

## 2022-02-20 MED ORDER — CITALOPRAM HYDROBROMIDE 20 MG PO TABS
10.0000 mg | ORAL_TABLET | Freq: Every day | ORAL | Status: DC
  Administered 2022-02-21: 10 mg via ORAL
  Filled 2022-02-20: qty 1

## 2022-02-20 MED ORDER — ENOXAPARIN SODIUM 40 MG/0.4ML IJ SOSY
40.0000 mg | PREFILLED_SYRINGE | Freq: Every day | INTRAMUSCULAR | Status: DC
  Administered 2022-02-20: 40 mg via SUBCUTANEOUS
  Filled 2022-02-20: qty 0.4

## 2022-02-20 MED ORDER — HYDRALAZINE HCL 20 MG/ML IJ SOLN: 10.0000 mg | INTRAMUSCULAR | Status: DC | PRN

## 2022-02-20 MED ORDER — CLOPIDOGREL BISULFATE 75 MG PO TABS
75.0000 mg | ORAL_TABLET | Freq: Every day | ORAL | Status: DC
  Administered 2022-02-20 – 2022-02-21 (×2): 75 mg via ORAL
  Filled 2022-02-20 (×2): qty 1

## 2022-02-20 MED ORDER — ASPIRIN 81 MG PO TBEC
81.0000 mg | DELAYED_RELEASE_TABLET | Freq: Every day | ORAL | Status: DC
  Administered 2022-02-21: 81 mg via ORAL
  Filled 2022-02-20: qty 1

## 2022-02-20 MED ORDER — ATORVASTATIN CALCIUM 40 MG PO TABS
40.0000 mg | ORAL_TABLET | Freq: Every day | ORAL | Status: DC
  Administered 2022-02-21: 40 mg via ORAL
  Filled 2022-02-20: qty 1

## 2022-02-20 MED ORDER — ACETAMINOPHEN 160 MG/5ML PO SOLN: 650.0000 mg | ORAL | Status: DC | PRN

## 2022-02-20 MED ORDER — ACETAMINOPHEN 325 MG PO TABS: 650.0000 mg | ORAL_TABLET | ORAL | Status: DC | PRN

## 2022-02-20 MED ORDER — GADOBUTROL 1 MMOL/ML IV SOLN
10.0000 mL | Freq: Once | INTRAVENOUS | Status: AC | PRN
  Administered 2022-02-20: 10 mL via INTRAVENOUS

## 2022-02-20 NOTE — Assessment & Plan Note (Addendum)
Heart rates as low as 42, history of chronic sinus bradycardia.  Currently on metoprolol. -Hold metoprolol, may be causing or contributing to his anginal symptoms, consider DC on discharge -Check TSH -Magnesium normal 1.9  - 9/27

## 2022-02-20 NOTE — Assessment & Plan Note (Signed)
Resume Celexa

## 2022-02-20 NOTE — H&P (Signed)
History and Physical    Samuel Mendez DXA:[id number] DOB: [birth year] DOA: 02/20/2022  PCP: Celene Squibb, MD   Patient coming from: Home  I have personally briefly reviewed patient's old medical records in [place]  Chief Complaint: Numbness  HPI: Samuel Mendez is a 81 y.o. male with medical history significant for  HTN, GAD, Depression, BPH. Patient presented to the ED with c/o numbness to lateral side of right leg and back of head of 2 days duration. Patient underwent Cardiac cath 10/30- 2 days ago for angina pectoris and Positive cardiac stress test. Cath results- Non-obstructive coronary artery disease. He is still having numbness to the back of his head, but his right lower extremity numbness is improving.  He denies facial asymmetry, no change in speech no change in vision no weakness of extremities, no other focal deficits or numbness daughter part of the body. Patient stopped taking aspirin about 2 to 3 years ago.    ED Course: Bradycardic heart rate 42-50.  Blood pressure 140s to 180s.  Troponin 13> 14. CT showing minor white matter microvascular changes. MRI brain showed small areas of acute infarct in the right cerebellum and left pons and right occipital cortex compatible with emboli.  MRA head and neck negative. Neuro hospitalist Dr. Malen Gauze recommends stroke work-up to include echo, lipid panel, A1c, formal neurology consult in a.m., aspirin and Plavix, statins.  Review of Systems: As per HPI all other systems reviewed and negative.  Past Medical History:  Diagnosis Date   Anemia, unspecified 09/11/2015   Anemia is due to giving platelets so frequently- we discussed slowing this down.     Anxiety and depression 04/30/2012   Comes and goes, never on medication    BPH (benign prostatic hyperplasia) 05/04/2014   Myrbetriq per Dr. Lenna Gilford for overactive bladder symptoms then came off and did not recur Also saw palmetto. Nocturia 0-1x a night    Cerumen impaction  03/26/2012   COLONIC POLYPS 04/24/2007   04/2011 adenoma x3- 5 year repeat     Depression, major, single episode, moderate ([place]) 03/27/2017   Deviated septum    Diverticulosis of colon    Diverticulosis of large intestine 04/24/2007   Qualifier: Diagnosis of  By: Lenna Gilford MD, Deborra Medina    DJD (degenerative joint disease)    GAD (generalized anxiety disorder) 03/27/2017   History of adenomatous polyp of colon 06/13/2016   2.2018. 5 year?   Hx of colonic polyps    Hypercholesteremia    Hypertension    MYALGIA 07/29/2007   Qualifier: Diagnosis of  By: Royal Piedra NP, Tammy     NECK PAIN 04/27/2010   Qualifier: Diagnosis of  By: Lenna Gilford MD, Deborra Medina    Obesity    Osteoarthritis 04/22/2007   Cervical (turns whole body at times), hands in past. No rx.      Prostatitis    PROSTATITIS, HX OF 04/22/2007   Qualifier: Diagnosis of  By: Julien Girt CMA, Leigh     RENAL CALCULUS 04/22/2007   x3, Linna Hoff doctor released him   Sinusitis, acute 03/26/2012    Past Surgical History:  Procedure Laterality Date   anal fistula repair     in 90s-outpatient repair   colles fracture left wrist after fall  11/2007   COLONOSCOPY     2013   SEPTOPLASTY N/A 05/08/2015   Procedure: NASAL SEPTOPLASTY;  Surgeon: Leta Baptist, MD;  Location: [facility];  Service: ENT;  Laterality: N/A;   SINUS ENDO  WITH FUSION Bilateral 05/08/2015   Procedure: ENDOSCOPIC BILATERAL  ETHMOIDECTOMY, BILATERAL MAXILLARY ANTROSTOMY, BILATERAL SPHENOIDECTOMY, BILATERAL  FRONTAL RECESS EXPLORATION WITH FUSION NAVIGATION;  Surgeon: Leta Baptist, MD;  Location: [facility];  Service: ENT;  Laterality: Bilateral;     reports that he quit smoking about 4 years ago. His smoking use included cigars. He has never used smokeless tobacco. He reports that he does not drink alcohol and does not use drugs.  No Known Allergies  Family History  Problem Relation Age of Onset   Pancreatic cancer Father    Hypertension Mother    Aneurysm Mother         brain died at 77   Colon cancer Neg Hx    Prior to Admission medications   Medication Sig Start Date End Date Taking? Authorizing Provider  Ascorbic Acid (VITAMIN C) 1000 MG tablet Take 1,000 mg by mouth daily.   Yes [provider]  atorvastatin (LIPITOR) 20 MG tablet Take 1 tablet (20 mg total) by mouth daily. 04/01/19  Yes Marin Olp, MD  benazepril (LOTENSIN) 40 MG tablet Take 40 mg by mouth daily.   Yes [provider]  citalopram (CELEXA) 10 MG tablet Take 1 tablet (10 mg total) by mouth daily. 04/01/19  Yes Marin Olp, MD  hydrochlorothiazide (HYDRODIURIL) 25 MG tablet Take 25 mg by mouth daily.   Yes [provider]  metoprolol succinate (TOPROL-XL) 25 MG 24 hr tablet Take 12.5 mg by mouth daily. 02/11/22  Yes [provider]  Multiple Vitamins-Minerals (MULTIVITAMIN ADULT PO) Take by mouth.   Yes [provider]  nitroGLYCERIN (NITROSTAT) 0.3 MG SL tablet Place 0.3 mg under the tongue every 5 (five) minutes as needed for chest pain. 02/11/22  Yes [provider]  Omega-3 Fatty Acids (FISH OIL) 1000 MG CAPS Take 1 capsule by mouth daily.   Yes [provider]  OVER THE COUNTER MEDICATION Take 450 mg by mouth daily. Saw Palmetto   Yes [provider]  OVER THE COUNTER MEDICATION Take 1 tablet by mouth daily. Beets chews   Yes [provider]  zinc gluconate 50 MG tablet Take 50 mg by mouth daily.   Yes [provider]    Physical Exam: Vitals:   02/20/22 1830 02/20/22 1859 02/20/22 1931 02/20/22 1956  BP: (!) 188/99 (!) 160/79 (!) 140/76   Pulse: (!) 50 (!) 42 (!) 43   Resp: 16  15   Temp:    97.8 F (36.6 C)  TempSrc:    Oral  SpO2: 93% 97% 100%     Constitutional: NAD, calm, comfortable Vitals:   02/20/22 1830 02/20/22 1859 02/20/22 1931 02/20/22 1956  BP: (!) 188/99 (!) 160/79 (!) 140/76   Pulse: (!) 50 (!) 42 (!) 43   Resp: 16  15   Temp:    97.8 F (36.6 C)   TempSrc:    Oral  SpO2: 93% 97% 100%    Eyes: PERRL, lids and conjunctivae normal ENMT: Mucous membranes are moist.   Neck: normal, supple, no masses, no thyromegaly Respiratory: clear to auscultation bilaterally, no wheezing, no crackles.  Cardiovascular: Bradycardic, regular rate and rhythm, no murmurs / rubs / gallops.  Abdomen: no tenderness, no masses palpated. No hepatosplenomegaly. Bowel sounds positive.  Musculoskeletal: no clubbing / cyanosis. No joint deformity upper and lower extremities. Skin: no rashes, lesions, ulcers. No induration Neurologic:  Neurological:     Mental Status: he is alert.  GCS: GCS eye subscore is 4. GCS verbal subscore is 5. GCS motor subscore is 6.     Comments: Mental Status:  Alert, oriented, thought content appropriate, able to give a coherent history. Cranial Nerves:  II:  Peripheral visual fields grossly normal, pupils equal, round, reactive to light III,IV, VI: ptosis not present, extra-ocular motions intact bilaterally  V,VII: smile symmetric, eyebrows raise symmetric, facial light touch sensation equal VIII: hearing grossly normal to voice  Motor:  Normal tone.   5/5 strength in lower extremities bilaterally including strong and equal dorsiflexion/plantar flexion.  Sensation intact globally-including back of head and lateral aspect of right lower extremity.  Cerebellar:   CV: distal pulses palpable throughout    Psychiatric: Normal judgment and insight. Alert and oriented x 3. Normal mood.   Labs on Admission: I have personally reviewed following labs and imaging studies  CBC: Recent Labs  Lab 02/20/22 1615  WBC 7.2  NEUTROABS 5.2  HGB 13.2  HCT 40.4  MCV 91.8  PLT 419   Basic Metabolic Panel: Recent Labs  Lab 02/20/22 1615  NA 139  K 3.9  CL 104  CO2 29  GLUCOSE 96  BUN 16  CREATININE 1.17  CALCIUM 9.2   GFR: CrCl cannot be calculated (Unknown ideal weight.). Liver Function Tests: Recent Labs  Lab  02/20/22 1615  AST 18  ALT 17  ALKPHOS 52  BILITOT 0.8  PROT 7.6  ALBUMIN 4.2   Radiological Exams on Admission: MR Angiogram Neck W or Wo Contrast  Result Date: 02/20/2022 CLINICAL DATA:  Acute neuro deficit.  Stroke. EXAM: MRA NECK WITHOUT AND WITH CONTRAST TECHNIQUE: Multiplanar and multiecho pulse sequences of the neck were obtained without and with intravenous contrast. Angiographic images of the neck were obtained using MRA technique without and with intravenous contrast, using 3D post processing. CONTRAST:  66m GADAVIST GADOBUTROL 1 MMOL/ML IV SOLN COMPARISON:  None Available. FINDINGS: Normal aortic arch.  Proximal great vessels widely patent. Internal carotid artery widely patent bilaterally without stenosis. Both vertebral arteries are patent without stenosis. IMPRESSION: Negative MRA neck Electronically Signed   By: CFranchot GalloM.D.   On: 02/20/2022 19:00   MR BRAIN WO CONTRAST  Result Date: 02/20/2022 CLINICAL DATA:  Acute neuro deficit.  Rule out stroke EXAM: MRI HEAD WITHOUT CONTRAST MRA HEAD WITHOUT CONTRAST TECHNIQUE: Multiplanar, multi-echo pulse sequences of the brain and surrounding structures were acquired without intravenous contrast. Angiographic images of the Circle of Willis were acquired using MRA technique without intravenous contrast. COMPARISON:  CT head 02/20/2022 FINDINGS: MRI HEAD FINDINGS Brain: Small areas of acute infarct right cerebellum, left pons, right occipital cortex. Scattered small white matter hyperintensities compatible with chronic microvascular ischemia. Negative for hemorrhage or mass. No hydrocephalus Vascular: Normal arterial flow voids. Skull and upper cervical spine: No focal skeletal lesion. Sinuses/Orbits: Mucosal edema paranasal sinuses diffusely. Negative orbit Other: None MRA HEAD FINDINGS Anterior circulation: Internal carotid artery widely patent bilaterally. Anterior and middle cerebral arteries patent without stenosis or aneurysm.  Posterior circulation: Both vertebral arteries patent to the basilar. Basilar patent. Superior cerebellar and posterior cerebral arteries patent bilaterally. No stenosis or vascular malformation Anatomic variants: None IMPRESSION: 1. Small areas of acute infarct in the right cerebellum, left pons, and right occipital cortex compatible with emboli. Mild chronic microvascular ischemic change in the white matter. 2. Negative MRA head. Electronically Signed   By: CFranchot GalloM.D.   On: 02/20/2022 18:52   MR ANGIO HEAD WO CONTRAST  Result Date:  02/20/2022 CLINICAL DATA:  Acute neuro deficit.  Rule out stroke EXAM: MRI HEAD WITHOUT CONTRAST MRA HEAD WITHOUT CONTRAST TECHNIQUE: Multiplanar, multi-echo pulse sequences of the brain and surrounding structures were acquired without intravenous contrast. Angiographic images of the Circle of Willis were acquired using MRA technique without intravenous contrast. COMPARISON:  CT head 02/20/2022 FINDINGS: MRI HEAD FINDINGS Brain: Small areas of acute infarct right cerebellum, left pons, right occipital cortex. Scattered small white matter hyperintensities compatible with chronic microvascular ischemia. Negative for hemorrhage or mass. No hydrocephalus Vascular: Normal arterial flow voids. Skull and upper cervical spine: No focal skeletal lesion. Sinuses/Orbits: Mucosal edema paranasal sinuses diffusely. Negative orbit Other: None MRA HEAD FINDINGS Anterior circulation: Internal carotid artery widely patent bilaterally. Anterior and middle cerebral arteries patent without stenosis or aneurysm. Posterior circulation: Both vertebral arteries patent to the basilar. Basilar patent. Superior cerebellar and posterior cerebral arteries patent bilaterally. No stenosis or vascular malformation Anatomic variants: None IMPRESSION: 1. Small areas of acute infarct in the right cerebellum, left pons, and right occipital cortex compatible with emboli. Mild chronic microvascular ischemic  change in the white matter. 2. Negative MRA head. Electronically Signed   By: Franchot Gallo M.D.   On: 02/20/2022 18:52   CT Head Wo Contrast  Result Date: 02/20/2022 CLINICAL DATA:  Right-sided paresthesias for 2 days EXAM: CT HEAD WITHOUT CONTRAST TECHNIQUE: Contiguous axial images were obtained from the base of the skull through the vertex without intravenous contrast. RADIATION DOSE REDUCTION: This exam was performed according to the departmental dose-optimization program which includes automated exposure control, adjustment of the mA and/or kV according to patient size and/or use of iterative reconstruction technique. COMPARISON:  01/07/2015 maxillofacial CT FINDINGS: Brain: Minor scattered white matter microvascular ischemic changes throughout both cerebral hemispheres. No acute intracranial hemorrhage, new mass lesion, definite new infarction, midline shift, herniation, hydrocephalus, or extra-axial fluid collection. No focal mass effect or edema. Cisterns are patent. No cerebellar abnormality. Vascular: No hyperdense vessel or unexpected calcification. Skull: Normal. Negative for fracture or focal lesion. Sinuses/Orbits: No acute finding. Other: None. IMPRESSION: 1. Minor white matter microvascular changes. 2. No acute intracranial abnormality by noncontrast CT. Electronically Signed   By: Jerilynn Mages.  Shick M.D.   On: 02/20/2022 17:24    EKG: Independently reviewed.  Sinus bradycardia, rate 43, QTc 413.  No significant ST or T wave changes from prior.  Assessment/Plan Principal Problem:   Acute CVA (cerebrovascular accident) [facility]) Active Problems:   Typical angina (HCC)   Sinus bradycardia   Essential hypertension   Depression, major, single episode, moderate (HCC)   GAD (generalized anxiety disorder)  Assessment and Plan: * Acute CVA (cerebrovascular accident) ([place]) Presenting with numbness to back of head and lateral aspect of right lower extremity.  No other focal neurologic deficits, and  exam is unremarkable.  Quit taking aspirin 2 to 3 years ago.  Patient had cardiac cath 2 days ago 10/30, at [facility], status post right a radial artery.  Catheter likely dislodged cholesterol/atherosclerotic plaques/debris in artery with resultant infarct. -MRI brain- Small areas of acute infarct in the right cerebellum, left pons, and right occipital cortex compatible with emboli.  -Telemetry neurologist Dr. Rory Percy recommend stroke work-up -Echo, hemoglobin A1c, lipid panel in a.m., -Defer CTA head and neck, MRA head and neck done and unremarkable -PT Evaluation -Allow for permissive hypertension, IV hydralazine for systolic greater than 008/676 -Aspirin 325 mg, Continue 81 Mg Daily, and Plavix 75 Mg Daily     Typical angina (HCC) Troponin  13 > 14.  Had cardiac cath 10/30 at [facility].  Patient notes in [place]- patient had symptoms of typical angina with functional decline, and abnormal EKG stress test at [facility] (report was not available)-with positive(horizontal ST Depression anterolateral lead). -Results of cardiac cath- nonobstructive CAD, High left ventricle end diastolic pressure. -Recommendation was to maximize medical therapy -Nitro as needed -Started on aspirin and Plavix -Will increase atorvastatin from 20 to 40 mg daily  Sinus bradycardia Heart rates as low as 42, history of chronic sinus bradycardia.  Currently on metoprolol. -Hold metoprolol, may be causing or contributing to his anginal symptoms, consider DC on discharge -Check TSH -Magnesium normal 1.9  - 9/27  GAD (generalized anxiety disorder) Resume Celexa  Essential hypertension Blood pressure 140s to 180s. -Allow for permissive hypertension, hold benazepril 40 mg, metoprolol 12.5 daily, HCTZ 25 mg. -IV hydralazine 10 mg for systolic greater than 098/119.   DVT prophylaxis: Lovenox Code Status: Full Family Communication: SPouse at bedside Disposition Plan: ~ 1 - 2 days Consults  called: Neurology Admission status: Inpt tele  I certify that at the point of admission it is my clinical judgment that the patient will require inpatient hospital care spanning beyond 2 midnights from the point of admission due to high intensity of service, high risk for further deterioration and high frequency of surveillance required.    Author: Bethena Roys, MD 02/20/2022 9:26 PM  For on call review [email or web address].

## 2022-02-20 NOTE — ED Provider Notes (Signed)
[facility] EMERGENCY DEPARTMENT Provider Note   CSN: [id number] Arrival date & time: 02/20/22  1532     History  Chief Complaint  Patient presents with   Numbness    Samuel Mendez is a 81 y.o. male presenting to the ED today with new episodes of numbness to the right side of the body.  Recently underwent a cardiac cath on Monday at [facility].  After arriving home, noticed numbness of the right face, right back of the head, right arm, right abdomen, and right leg.  States the symptoms lasted for 24 hours, also noted some difficulty walking at the time.  Felt like a "drunk walk".  This morning noticed an improvement in symptoms however still notes some decreased sensation to the right side of his body.  Diminished sensation worst of the right sole of the foot near the first great toe and outside of the right ankle, also the right backside of the head.  Denies blurry vision, double vision, N/V, chest pain, shortness of breath, weakness, tingling, or difficulty speaking.  Patient accompanied with his wife, who states patient appears at baseline and has not had any difficulty speaking since the procedure.  Not on anticoagulation.  No other complaints at this time.  Per record review, recent cardiac cath on Monday at [facility].  This was due to progressive angina with functional decline, in addition to an abnormal stress test at the [place] which indicated horizontal ST depression in the anterolateral lateral lead.  Findings indicate minimal non-obstructive CAD; minimally elevated LVEDP per review of records.  Recommended to continue current medical management at time.  The history is provided by the patient and medical records.      Home Medications Prior to Admission medications   Medication Sig Start Date End Date Taking? Authorizing Provider  Ascorbic Acid (VITAMIN C) 1000 MG tablet Take 1,000 mg by mouth daily.   Yes [provider]  atorvastatin (LIPITOR) 20 MG  tablet Take 1 tablet (20 mg total) by mouth daily. 04/01/19  Yes Marin Olp, MD  benazepril (LOTENSIN) 40 MG tablet Take 40 mg by mouth daily.   Yes [provider]  citalopram (CELEXA) 10 MG tablet Take 1 tablet (10 mg total) by mouth daily. 04/01/19  Yes Marin Olp, MD  hydrochlorothiazide (HYDRODIURIL) 25 MG tablet Take 25 mg by mouth daily.   Yes [provider]  metoprolol succinate (TOPROL-XL) 25 MG 24 hr tablet Take 12.5 mg by mouth daily. 02/11/22  Yes [provider]  Multiple Vitamins-Minerals (MULTIVITAMIN ADULT PO) Take by mouth.   Yes [provider]  nitroGLYCERIN (NITROSTAT) 0.3 MG SL tablet Place 0.3 mg under the tongue every 5 (five) minutes as needed for chest pain. 02/11/22  Yes [provider]  Omega-3 Fatty Acids (FISH OIL) 1000 MG CAPS Take 1 capsule by mouth daily.   Yes [provider]  OVER THE COUNTER MEDICATION Take 450 mg by mouth daily. Saw Palmetto   Yes [provider]  OVER THE COUNTER MEDICATION Take 1 tablet by mouth daily. Beets chews   Yes [provider]  zinc gluconate 50 MG tablet Take 50 mg by mouth daily.   Yes [provider]      Allergies    Patient has no known allergies.    Review of Systems   Review of Systems  Neurological:  Positive for numbness.    Physical Exam Updated Vital Signs BP (!) 140/76  Pulse (!) 43   Temp 97.8 F (36.6 C) (Oral)   Resp 15   SpO2 100%  Physical Exam Vitals and nursing note reviewed.  Constitutional:      General: He is not in acute distress.    Appearance: He is well-developed.  HENT:     Head: Normocephalic and atraumatic.  Eyes:     General: Lids are normal. Gaze aligned appropriately.     Conjunctiva/sclera: Conjunctivae normal.     Pupils: Pupils are equal, round, and reactive to light.     Visual Fields: Right eye visual fields normal.     Right eye: CF in the upper temporal quadrant. CF in the upper  nasal quadrant. CF in the lower temporal quadrant. CF in the lower nasal quadrant.     Left eye: CF in the upper temporal quadrant. CF in the lower temporal quadrant.     Comments: Abnormal visual fields of the left eye appreciated on exam.  Cardiovascular:     Rate and Rhythm: Normal rate and regular rhythm.     Heart sounds: No murmur heard. Pulmonary:     Effort: Pulmonary effort is normal. No respiratory distress.     Breath sounds: Normal breath sounds.  Abdominal:     Palpations: Abdomen is soft.     Tenderness: There is no abdominal tenderness.  Musculoskeletal:        General: No swelling.     Cervical back: Neck supple.  Skin:    General: Skin is warm and dry.     Capillary Refill: Capillary refill takes less than 2 seconds.  Neurological:     Mental Status: He is alert and oriented to person, place, and time.     Motor: No weakness.     Coordination: Coordination normal.     Comments: Normal EOMs.  PERRLA.  Negative pronator drift.  Coordination of upper and lower extremities appears grossly intact.  No truncal deviation.  No facial asymmetry appreciated.  CN V, CN VII-XII appear grossly intact.  Psychiatric:        Mood and Affect: Mood normal.     ED Results / Procedures / Treatments   Labs (all labs ordered are listed, but only abnormal results are displayed) Labs Reviewed  CBC WITH DIFFERENTIAL/PLATELET  COMPREHENSIVE METABOLIC PANEL  TROPONIN I (HIGH SENSITIVITY)  TROPONIN I (HIGH SENSITIVITY)    EKG None  Radiology MR Angiogram Neck W or Wo Contrast  Result Date: 02/20/2022 CLINICAL DATA:  Acute neuro deficit.  Stroke. EXAM: MRA NECK WITHOUT AND WITH CONTRAST TECHNIQUE: Multiplanar and multiecho pulse sequences of the neck were obtained without and with intravenous contrast. Angiographic images of the neck were obtained using MRA technique without and with intravenous contrast, using 3D post processing. CONTRAST:  53m GADAVIST GADOBUTROL 1 MMOL/ML IV SOLN  COMPARISON:  None Available. FINDINGS: Normal aortic arch.  Proximal great vessels widely patent. Internal carotid artery widely patent bilaterally without stenosis. Both vertebral arteries are patent without stenosis. IMPRESSION: Negative MRA neck Electronically Signed   By: CFranchot GalloM.D.   On: 02/20/2022 19:00   MR BRAIN WO CONTRAST  Result Date: 02/20/2022 CLINICAL DATA:  Acute neuro deficit.  Rule out stroke EXAM: MRI HEAD WITHOUT CONTRAST MRA HEAD WITHOUT CONTRAST TECHNIQUE: Multiplanar, multi-echo pulse sequences of the brain and surrounding structures were acquired without intravenous contrast. Angiographic images of the Circle of Willis were acquired using MRA technique without intravenous contrast. COMPARISON:  CT head 02/20/2022 FINDINGS: MRI HEAD FINDINGS Brain:  Small areas of acute infarct right cerebellum, left pons, right occipital cortex. Scattered small white matter hyperintensities compatible with chronic microvascular ischemia. Negative for hemorrhage or mass. No hydrocephalus Vascular: Normal arterial flow voids. Skull and upper cervical spine: No focal skeletal lesion. Sinuses/Orbits: Mucosal edema paranasal sinuses diffusely. Negative orbit Other: None MRA HEAD FINDINGS Anterior circulation: Internal carotid artery widely patent bilaterally. Anterior and middle cerebral arteries patent without stenosis or aneurysm. Posterior circulation: Both vertebral arteries patent to the basilar. Basilar patent. Superior cerebellar and posterior cerebral arteries patent bilaterally. No stenosis or vascular malformation Anatomic variants: None IMPRESSION: 1. Small areas of acute infarct in the right cerebellum, left pons, and right occipital cortex compatible with emboli. Mild chronic microvascular ischemic change in the white matter. 2. Negative MRA head. Electronically Signed   By: Franchot Gallo M.D.   On: 02/20/2022 18:52   MR ANGIO HEAD WO CONTRAST  Result Date: 02/20/2022 CLINICAL DATA:   Acute neuro deficit.  Rule out stroke EXAM: MRI HEAD WITHOUT CONTRAST MRA HEAD WITHOUT CONTRAST TECHNIQUE: Multiplanar, multi-echo pulse sequences of the brain and surrounding structures were acquired without intravenous contrast. Angiographic images of the Circle of Willis were acquired using MRA technique without intravenous contrast. COMPARISON:  CT head 02/20/2022 FINDINGS: MRI HEAD FINDINGS Brain: Small areas of acute infarct right cerebellum, left pons, right occipital cortex. Scattered small white matter hyperintensities compatible with chronic microvascular ischemia. Negative for hemorrhage or mass. No hydrocephalus Vascular: Normal arterial flow voids. Skull and upper cervical spine: No focal skeletal lesion. Sinuses/Orbits: Mucosal edema paranasal sinuses diffusely. Negative orbit Other: None MRA HEAD FINDINGS Anterior circulation: Internal carotid artery widely patent bilaterally. Anterior and middle cerebral arteries patent without stenosis or aneurysm. Posterior circulation: Both vertebral arteries patent to the basilar. Basilar patent. Superior cerebellar and posterior cerebral arteries patent bilaterally. No stenosis or vascular malformation Anatomic variants: None IMPRESSION: 1. Small areas of acute infarct in the right cerebellum, left pons, and right occipital cortex compatible with emboli. Mild chronic microvascular ischemic change in the white matter. 2. Negative MRA head. Electronically Signed   By: Franchot Gallo M.D.   On: 02/20/2022 18:52   CT Head Wo Contrast  Result Date: 02/20/2022 CLINICAL DATA:  Right-sided paresthesias for 2 days EXAM: CT HEAD WITHOUT CONTRAST TECHNIQUE: Contiguous axial images were obtained from the base of the skull through the vertex without intravenous contrast. RADIATION DOSE REDUCTION: This exam was performed according to the departmental dose-optimization program which includes automated exposure control, adjustment of the mA and/or kV according to patient  size and/or use of iterative reconstruction technique. COMPARISON:  01/07/2015 maxillofacial CT FINDINGS: Brain: Minor scattered white matter microvascular ischemic changes throughout both cerebral hemispheres. No acute intracranial hemorrhage, new mass lesion, definite new infarction, midline shift, herniation, hydrocephalus, or extra-axial fluid collection. No focal mass effect or edema. Cisterns are patent. No cerebellar abnormality. Vascular: No hyperdense vessel or unexpected calcification. Skull: Normal. Negative for fracture or focal lesion. Sinuses/Orbits: No acute finding. Other: None. IMPRESSION: 1. Minor white matter microvascular changes. 2. No acute intracranial abnormality by noncontrast CT. Electronically Signed   By: Jerilynn Mages.  Shick M.D.   On: 02/20/2022 17:24    Procedures Procedures    Medications Ordered in ED Medications  gadobutrol (GADAVIST) 1 MMOL/ML injection 10 mL (10 mLs Intravenous Contrast Given 02/20/22 1749)    ED Course/ Medical Decision Making/ A&P Clinical Course as of 02/20/22 2024  Wed Feb 20, 2022  1947 Consulted with Dr. Rory Percy of neurology.  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  Recommends admission for anticoagulation, further work-up, and further evaluation.  Recommends admission tonight and to have the hospitalist call Dr. Hortense Ramal first thing tomorrow morning for consultation.  Please see Dr. Johny Chess note for further details. [AC]  2007 Consulted with Dr. Denton Brick of hospitalist team.  Discussed patient's case and recommendations of neurology in detail.  Agrees with plan for admission. [AC]  2013 MR ANGIO HEAD WO CONTRAST [AC]    Clinical Course User Index [AC] Prince Rome, PA-C                           Medical Decision Making Amount and/or Complexity of Data Reviewed Labs: ordered.   81 y.o. male presents to the ED for concern of Numbness   This involves an extensive number of treatment options, and is a complaint that  carries with it a high risk of complications and morbidity.  The emergent differential diagnosis prior to evaluation includes, but is not limited to: CVA, dissection  This is not an exhaustive differential.   Past Medical History / Co-morbidities / Social History: Hx of recent cardiac catheterization 2 days prior. Social Determinants of Health include: Elderly  Additional History:  Obtained by chart review.  Notably recent cardiac catheterization procedure, see for details  Lab Tests: I ordered, and personally interpreted labs.  The pertinent results include:   CMP and CBC unremarkable Troponin 13, 14  Imaging Studies: I ordered imaging studies including CT head, MRI brain, MRA head, MRA neck.   I independently visualized and interpreted imaging which showed  CT head negative for acute intracranial pathology MRA neck unremarkable MRA head/MRI brain indicates Small areas of acute infarct in the right cerebellum, left pons, and right occipital cortex compatible with emboli. Mild chronic microvascular ischemic change in the white matter I agree with the radiologist interpretation.  ED Course: Pt well-appearing on exam.  Nonseptic, nontoxic appearing in NAD.  AAOx4.  At baseline per patient's spouse.  Presenting today with continued numbness following a cardiac catheterization 2 days ago.  Initially returned home after the procedure with numbness to the entire right side of the body including head, face, arm, torso, and leg.  Also notes some difficulty walking initially, stating he felt like he was "drunk walking".  With some improvement when he woke up this morning.  Still with subjective numbness of the right sole of the foot near the first metacarpal, right lateral ankle, and posterior right occipital region.  With slightly diminished sensation of the remaining right side of the body, however reportedly significantly improved.  Without subjective vision changes however visual fields appear  abnormal of the left eye.  PERRLA.  EOMs appear grossly normal.  Negative pronator drift, no dysarthria, and no dysphagia.  Remaining neuro exam appears unremarkable as described above.  Without chest pain, shortness of breath, nausea, vomiting, dizziness, or lightheadedness.  Presentation provides suspicion for possible CVA.  Consulted with Dr. Leonel Ramsay of neurology, who recommended proceeding with MRI brain, and MRA head and neck. Upon reevaluation, patient status remains unchanged.  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  Consulted with Dr. Rory Percy, who recommended admission for further work-up and adding Plavix to patient's current use of ASA following the catheterization.  Also recommends calling Dr. Hortense Ramal first thing in the morning for further consultation.  See his note for details. Consulted with  Dr. Denton Brick hospitalist team, discussed recommendations of neurology and patient case.  Agrees with plan for admission.   Disposition: Admission  I discussed this case with my attending physician Dr. Roderic Palau, who agreed with the proposed treatment course and cosigned this note including patient's presenting symptoms, physical exam, and planned diagnostics and interventions.  Attending physician stated agreement with plan or made changes to plan which were implemented.     This chart was dictated using voice recognition software.  Despite best efforts to proofread, errors can occur which can change the documentation meaning.         Final Clinical Impression(s) / ED Diagnoses Final diagnoses:  Numbness  Acute CVA (cerebrovascular accident) [facility])    Rx / [place] Orders ED Discharge Orders     None         Candace Cruise 16/10/96 2025    Milton Ferguson, MD 02/22/22 1528

## 2022-02-20 NOTE — Plan of Care (Signed)
On-call cross covering neuro hospitalist note  Patient with recent cardiac cath now with focal neurological symptoms.  MRI brain with posterior circulation embolic looking infarcts. I recommend admission to [facility], dual antiplatelets-aspirin 81+ Plavix 75, high intensity statin, stroke work-up to include CT angiography head and neck, 2D echocardiogram, lipid panel and A1c along with frequent neurochecks, PT OT and speech therapy evaluations. Please obtain formal neurological consultation with Dr. Hortense Ramal in the morning.   -- Amie Portland, MD Neurologist [facility] Pager: [phone]

## 2022-02-20 NOTE — Assessment & Plan Note (Signed)
Troponin 13 > 14.  Had cardiac cath 10/30 at [facility].  Patient notes in [place]- patient had symptoms of typical angina with functional decline, and abnormal EKG stress test at [facility] (report was not available)-with positive(horizontal ST Depression anterolateral lead). -Results of cardiac cath- nonobstructive CAD, High left ventricle end diastolic pressure. -Recommendation was to maximize medical therapy -Nitro as needed -Started on aspirin and Plavix -Will increase atorvastatin from 20 to 40 mg daily

## 2022-02-20 NOTE — Assessment & Plan Note (Signed)
Blood pressure 140s to 180s. -Allow for permissive hypertension, hold benazepril 40 mg, metoprolol 12.5 daily, HCTZ 25 mg. -IV hydralazine 10 mg for systolic greater than 166/060.

## 2022-02-20 NOTE — Assessment & Plan Note (Deleted)
Troponin 13 > 14.     R-radial artery   High left ventricle end diastolic pressure.  No significant aortic valve gradient on catheter pullback.  Maximize medical therapy   He has been having progressive sx of typical angina with functional decline. He had an abnormal EKG [facility] test [facility]  (report unavailable) which was positive (horizontal ST Depression anterolateral lead). No prior LHC. Not on anticoagulation.

## 2022-02-20 NOTE — ED Triage Notes (Signed)
Pt c/o numbness to right lower leg, back of head and right lower arm x 2 days. Went to [facility] Monday and had cardiac cath. Pt ambulatory. Denies any trouble swallowing or speaking. A/o. Color wnl. Moving all extremities.

## 2022-02-20 NOTE — ED Notes (Signed)
Report called to [facility] on 300

## 2022-02-20 NOTE — ED Notes (Signed)
Report called to 300.  Nurse will call back when not with a patient.

## 2022-02-20 NOTE — ED Notes (Signed)
Pt passed swallow study.  NIH stroke scale negative.

## 2022-02-20 NOTE — Assessment & Plan Note (Addendum)
Presenting with numbness to back of head and lateral aspect of right lower extremity.  No other focal neurologic deficits, and exam is unremarkable.  Quit taking aspirin 2 to 3 years ago.  Patient had cardiac cath 2 days ago 10/30, at [facility], status post right a radial artery.  Catheter likely dislodged cholesterol/atherosclerotic plaques/debris in artery with resultant infarct. -MRI brain- Small areas of acute infarct in the right cerebellum, left pons, and right occipital cortex compatible with emboli.  -Telemetry neurologist Dr. Rory Percy recommend stroke work-up -Echo, hemoglobin A1c, lipid panel in a.m., -Defer CTA head and neck, MRA head and neck done and unremarkable -PT Evaluation -Allow for permissive hypertension, IV hydralazine for systolic greater than 223/361 -Aspirin 325 mg, Continue 81 Mg Daily, and Plavix 75 Mg Daily

## 2022-02-21 ENCOUNTER — Inpatient Hospital Stay (HOSPITAL_COMMUNITY): Payer: No Typology Code available for payment source

## 2022-02-21 ENCOUNTER — Telehealth: Payer: Self-pay | Admitting: *Deleted

## 2022-02-21 DIAGNOSIS — I639 Cerebral infarction, unspecified: Secondary | ICD-10-CM

## 2022-02-21 DIAGNOSIS — R200 Anesthesia of skin: Secondary | ICD-10-CM

## 2022-02-21 DIAGNOSIS — I6389 Other cerebral infarction: Secondary | ICD-10-CM | POA: Diagnosis not present

## 2022-02-21 LAB — ECHOCARDIOGRAM COMPLETE
AR max vel: 3.5 cm2
AV Area VTI: 3.39 cm2
AV Area mean vel: 3.61 cm2
AV Mean grad: 5 mmHg
AV Peak grad: 10.9 mmHg
Ao pk vel: 1.65 m/s
Area-P 1/2: 2.55 cm2
Height: 72 in
MV VTI: 2.43 cm2
S' Lateral: 2.1 cm
Weight: 3486.4 oz

## 2022-02-21 LAB — LIPID PANEL
Cholesterol: 116 mg/dL (ref 0–200)
HDL: 46 mg/dL (ref >40)
LDL Cholesterol: 58 mg/dL (ref 0–99)
Total CHOL/HDL Ratio: 2.5 RATIO
Triglycerides: 62 mg/dL (ref <150)
VLDL: 12 mg/dL (ref 0–40)

## 2022-02-21 LAB — HEMOGLOBIN A1C
Hgb A1c MFr Bld: 4.6 % — ABNORMAL LOW (ref 4.8–5.6)
Mean Plasma Glucose: 85.32 mg/dL

## 2022-02-21 MED ORDER — ACETAMINOPHEN 325 MG PO TABS: 650.0000 mg | ORAL_TABLET | ORAL | 0 refills | Status: DC | PRN

## 2022-02-21 MED ORDER — ASPIRIN 81 MG PO TBEC: 81.0000 mg | DELAYED_RELEASE_TABLET | Freq: Every day | ORAL | 11 refills | Status: DC

## 2022-02-21 MED ORDER — CLOPIDOGREL BISULFATE 75 MG PO TABS: 75.0000 mg | ORAL_TABLET | Freq: Every day | ORAL | Status: DC

## 2022-02-21 MED ORDER — CLOPIDOGREL BISULFATE 75 MG PO TABS: 75.0000 mg | ORAL_TABLET | Freq: Every day | ORAL | 0 refills | Status: DC

## 2022-02-21 MED ORDER — METOPROLOL SUCCINATE ER 25 MG PO TB24: 12.5000 mg | ORAL_TABLET | Freq: Every day | ORAL | 2 refills | Status: DC

## 2022-02-21 MED ORDER — ATORVASTATIN CALCIUM 40 MG PO TABS: 40.0000 mg | ORAL_TABLET | Freq: Every day | ORAL | 3 refills | Status: AC

## 2022-02-21 NOTE — Progress Notes (Incomplete)
*  PRELIMINARY RESULTS* Echocardiogram 2D Echocardiogram has been performed.  Samuel Mendez 02/21/2022, 9:31 AM

## 2022-02-21 NOTE — Discharge Instructions (Signed)
1)Please take Aspirin 81 mg daily along with Plavix 75 mg daily for 21 days then after that STOP the Plavix  and continue ONLY Aspirin 81 mg daily indefinitely--for secondary stroke Prevention (Per The multicenter SAMMPRIS trial)  2)Avoid ibuprofen/Advil/Aleve/Motrin/Goody Powders/Naproxen/BC powders/Meloxicam/Diclofenac/Indomethacin and other Nonsteroidal anti-inflammatory medications as these will make you more likely to bleed and can cause stomach ulcers, can also cause Kidney problems.   3)Zio-Patch Heart Monitor to help monitor your heart rate and look for irregular heartbeat for up to 30 days will be sent by mail to your house--- instructions will be included

## 2022-02-21 NOTE — Telephone Encounter (Signed)
Order from Dr. Denton Brick for 30 day event monitor for stroke. Pt enrolled in [employer] and order placed.

## 2022-02-21 NOTE — Plan of Care (Signed)
  Problem: Clinical Measurements: Goal: Ability to maintain clinical measurements within normal limits will improve Outcome: Not Progressing Goal: Will remain free from infection Outcome: Not Progressing Goal: Diagnostic test results will improve Outcome: Not Progressing Goal: Respiratory complications will improve Outcome: Not Progressing Goal: Cardiovascular complication will be avoided Outcome: Not Progressing   Problem: Clinical Measurements: Goal: Diagnostic test results will improve Outcome: Not Progressing   Problem: Clinical Measurements: Goal: Cardiovascular complication will be avoided Outcome: Not Progressing   Problem: Safety: Goal: Ability to remain free from injury will improve Outcome: Not Progressing

## 2022-02-21 NOTE — Evaluation (Signed)
Physical Therapy Evaluation Patient Details Name: Samuel Mendez MRN: [id number] DOB: Nov 19, 1940 Today's Date: 02/21/2022  History of Present Illness  Alexey W Camino is a 81 y.o. male with medical history significant for  HTN, GAD, Depression, BPH. Patient presented to the ED with c/o numbness to lateral side of right leg and back of head of 2 days duration. Patient underwent Cardiac cath 10/30- 2 days ago for angina pectoris and Positive cardiac stress test. Cath results- Non-obstructive coronary artery disease.  He is still having numbness to the back of his head, but his right lower extremity numbness is improving.  He denies facial asymmetry, no change in speech no change in vision no weakness of extremities, no other focal deficits or numbness daughter part of the body.  Patient stopped taking aspirin about 2 to 3 years ago.   Clinical Impression  Patient functioning near baseline for functional mobility and gait. Patient demonstrates good return for stair negotiation and ambulation on level, inclined and declined surfaces with no AD/loss of balance. Patient left seated EOB with wife in room. Plan: patient discharged from physical therapy to care of nursing for ambulation daily as tolerated for length of stay.     Recommendations for follow up therapy are one component of a multi-disciplinary discharge planning process, led by the attending physician.  Recommendations may be updated based on patient status, additional functional criteria and insurance authorization.  Follow Up Recommendations No PT follow up      Assistance Recommended at Discharge PRN  Patient can return home with the following  Other (comment) (patient functioning near baseline)    Equipment Recommendations None recommended by PT  Recommendations for Other Services       Functional Status Assessment Patient has not had a recent decline in their functional status     Precautions / Restrictions  Precautions Precautions: None Restrictions Weight Bearing Restrictions: No      Mobility  Bed Mobility Overal bed mobility: Independent                  Transfers Overall transfer level: Independent Equipment used: None                    Ambulation/Gait Ambulation/Gait assistance: Independent Gait Distance (Feet): 175 Feet Assistive device: None Gait Pattern/deviations: WFL(Within Functional Limits) Gait velocity: normal     General Gait Details: patient demonstrates good return for ambulation on level, declined and inclined surfaces with no AD/loss of balance  Stairs Stairs: Yes Stairs assistance: Independent Stair Management: Two rails, Alternating pattern, One rail Left Number of Stairs: 10 General stair comments: patient able to negotiate stairs with alternating pattern using bil rails on ascend, left rail on descend with no AD/loss of balance  Wheelchair Mobility    Modified Rankin (Stroke Patients Only)       Balance Overall balance assessment: Mild deficits observed, not formally tested                                           Pertinent Vitals/Pain Pain Assessment Pain Assessment: No/denies pain    Home Living Family/patient expects to be discharged to:: Private residence Living Arrangements: Spouse/significant other Available Help at Discharge: Family;Available 24 hours/day Type of Home: House Home Access: Stairs to enter Entrance Stairs-Rails: Can reach both;Left;Right Entrance Stairs-Number of Steps: 4-6   Home Layout: One level;Laundry or work area  in basement Home Equipment: Shower seat - built Medical sales representative (2 wheels);Cane - quad;Cane - single point      Prior Function Prior Level of Function : Independent/Modified Independent;Driving             Mobility Comments: Community ambulator w/o AD, drives ADLs Comments: Independent     Hand Dominance        Extremity/Trunk Assessment   Upper  Extremity Assessment Upper Extremity Assessment: Overall WFL for tasks assessed    Lower Extremity Assessment Lower Extremity Assessment: Overall WFL for tasks assessed    Cervical / Trunk Assessment Cervical / Trunk Assessment: Normal  Communication   Communication: No difficulties  Cognition Arousal/Alertness: Awake/alert Behavior During Therapy: WFL for tasks assessed/performed Overall Cognitive Status: Within Functional Limits for tasks assessed                                          General Comments      Exercises     Assessment/Plan    PT Assessment Patient does not need any further PT services  PT Problem List         PT Treatment Interventions      PT Goals (Current goals can be found in the Care Plan section)  Acute Rehab PT Goals Patient Stated Goal: return home PT Goal Formulation: With patient/family Time For Goal Achievement: 02/21/22 Potential to Achieve Goals: Good    Frequency       Co-evaluation               AM-PAC PT "6 Clicks" Mobility  Outcome Measure Help needed turning from your back to your side while in a flat bed without using bedrails?: None Help needed moving from lying on your back to sitting on the side of a flat bed without using bedrails?: None Help needed moving to and from a bed to a chair (including a wheelchair)?: None Help needed standing up from a chair using your arms (e.g., wheelchair or bedside chair)?: None Help needed to walk in hospital room?: None Help needed climbing 3-5 steps with a railing? : None 6 Click Score: 24    End of Session   Activity Tolerance: Patient tolerated treatment well Patient left: in bed;with family/visitor present;with call bell/phone within reach (left seated EOB with wife in room) Nurse Communication: Mobility status PT Visit Diagnosis: Unsteadiness on feet (R26.81);Other abnormalities of gait and mobility (R26.89);Muscle weakness (generalized) (M62.81)     Time: 0912-0928 PT Time Calculation (min) (ACUTE ONLY): 16 min   Charges:   PT Evaluation $PT Eval Low Complexity: 1 Low PT Treatments $Therapeutic Activity: 8-22 mins        Zigmund Gottron, SPT

## 2022-02-21 NOTE — Consult Note (Signed)
I connected with  Samuel Mendez on 02/21/22 by a video enabled telemedicine application and verified that I am speaking with the correct person using two identifiers.   I discussed the limitations of evaluation and management by telemedicine. The patient expressed understanding and agreed to proceed.  Location of patient: [facility] Location of physician: [facility]  Neurology Consultation Reason for Consult: stroke Referring Physician: Dr Dorise Bullion  CC: Numbness on the right side  History is obtained from: Patient, chart review  HPI: Samuel Mendez is a 81 y.o. male with past medical history of hypertension, hyperlipidemia who presented to the emergency room with right-sided numbness.  Patient states he had a cardiac cath on Monday at [facility] which got done at around 3 PM.  He got home by around 6 PM and noticed numbness on the right side of his face arm and leg.  States he initially thought this was effect of anesthesia.  However his symptoms were worse on Tuesday.  Eventually on Wednesday he called [facility] back and they told him to come to the emergency room.  LKN 02/18/2022 ~ 1500 Event happened at home No tPA as outside window No thrombectomy as no large vessel occlusion mRS 0  ROS: All other systems reviewed and negative except as noted in the HPI.   Past Medical History:  Diagnosis Date   Anemia, unspecified 09/11/2015   Anemia is due to giving platelets so frequently- we discussed slowing this down.     Anxiety and depression 04/30/2012   Comes and goes, never on medication    BPH (benign prostatic hyperplasia) 05/04/2014   Myrbetriq per Dr. Lenna Gilford for overactive bladder symptoms then came off and did not recur Also saw palmetto. Nocturia 0-1x a night    Cerumen impaction 03/26/2012   COLONIC POLYPS 04/24/2007   04/2011 adenoma x3- 5 year repeat     Depression, major, single episode, moderate ([place]) 03/27/2017   Deviated septum     Diverticulosis of colon    Diverticulosis of large intestine 04/24/2007   Qualifier: Diagnosis of  By: Lenna Gilford MD, Deborra Medina    DJD (degenerative joint disease)    GAD (generalized anxiety disorder) 03/27/2017   History of adenomatous polyp of colon 06/13/2016   2.2018. 5 year?   Hx of colonic polyps    Hypercholesteremia    Hypertension    MYALGIA 07/29/2007   Qualifier: Diagnosis of  By: Royal Piedra NP, Tammy     NECK PAIN 04/27/2010   Qualifier: Diagnosis of  By: Lenna Gilford MD, Deborra Medina    Obesity    Osteoarthritis 04/22/2007   Cervical (turns whole body at times), hands in past. No rx.      Prostatitis    PROSTATITIS, HX OF 04/22/2007   Qualifier: Diagnosis of  By: Julien Girt CMA, Leigh     RENAL CALCULUS 04/22/2007   x3, Linna Hoff doctor released him   Sinusitis, acute 03/26/2012     Family History  Problem Relation Age of Onset   Pancreatic cancer Father    Hypertension Mother    Aneurysm Mother        brain died at 63   Colon cancer Neg Hx      Social History:  reports that he quit smoking about 4 years ago. His smoking use included cigars. He has never used smokeless tobacco. He reports that he does not drink alcohol and does not use drugs.   Medications Prior to Admission  Medication Sig  Dispense Refill Last Dose   Ascorbic Acid (VITAMIN C) 1000 MG tablet Take 1,000 mg by mouth daily.   unknown   atorvastatin (LIPITOR) 20 MG tablet Take 1 tablet (20 mg total) by mouth daily. 90 tablet 3 02/20/2022   benazepril (LOTENSIN) 40 MG tablet Take 40 mg by mouth daily.   02/20/2022   citalopram (CELEXA) 10 MG tablet Take 1 tablet (10 mg total) by mouth daily. 90 tablet 3 02/20/2022   hydrochlorothiazide (HYDRODIURIL) 25 MG tablet Take 25 mg by mouth daily.   02/20/2022   metoprolol succinate (TOPROL-XL) 25 MG 24 hr tablet Take 12.5 mg by mouth daily.   02/20/2022 at 1000   Multiple Vitamins-Minerals (MULTIVITAMIN ADULT PO) Take by mouth.   Past Week   nitroGLYCERIN (NITROSTAT) 0.3 MG SL tablet Place  0.3 mg under the tongue every 5 (five) minutes as needed for chest pain.   unknown   Omega-3 Fatty Acids (FISH OIL) 1000 MG CAPS Take 1 capsule by mouth daily.   02/20/2022   OVER THE COUNTER MEDICATION Take 450 mg by mouth daily. Saw Palmetto   02/20/2022   OVER THE COUNTER MEDICATION Take 1 tablet by mouth daily. Beets chews   Past Month   zinc gluconate 50 MG tablet Take 50 mg by mouth daily.   Past Week      Exam: Current vital signs: BP 112/61 (BP Location: Right Arm)   Pulse (!) 42   Temp 97.6 F (36.4 C) (Oral)   Resp 18   Ht 6' (1.829 m)   Wt 98.8 kg   SpO2 98%   BMI 29.55 kg/m  Vital signs in last 24 hours: Temp:  [97.6 F (36.4 C)-98 F (36.7 C)] 97.6 F (36.4 C) (11/02 0555) Pulse Rate:  [41-50] 42 (11/02 0555) Resp:  [15-20] 18 (11/02 0555) BP: (104-188)/(49-99) 112/61 (11/02 0555) SpO2:  [93 %-100 %] 98 % (11/02 0555) Weight:  [98.8 kg] 98.8 kg (11/01 2201)   Physical Exam  Constitutional: Appears well-developed and well-nourished.  Psych: Affect appropriate to situation Neuro: AOX3, CN 2-12 grossly intact except numbness on right side of face, antigravity strength in all extremities, numbness in right side of upper and lower extremities, FTN intact BL  NIHSS 1  INPUTS: 1A: Level of consciousness --> 0 = Alert; keenly responsive 1B: Ask month and age --> 0 = Both questions right 1C: 'Blink eyes' & 'squeeze hands' --> 0 = Performs both tasks 2: Horizontal extraocular movements --> 0 = Normal 3: Visual fields --> 0 = No visual loss 4: Facial palsy --> 0 = Normal symmetry 5A: Left arm motor drift --> 0 = No drift for 10 seconds 5B: Right arm motor drift --> 0 = No drift for 10 seconds 6A: Left leg motor drift --> 0 = No drift for 5 seconds 6B: Right leg motor drift --> 0 = No drift for 5 seconds 7: Limb Ataxia --> 0 = No ataxia 8: Sensation --> 1 = Mild-moderate loss: can sense being touched  9: Language/aphasia --> 0 = Normal; no aphasia 10: Dysarthria  --> 0 = Normal 11: Extinction/inattention --> 0 = No abnormality   I have reviewed labs in epic and the results pertinent to this consultation are: CBC:  Recent Labs  Lab 02/20/22 1615  WBC 7.2  NEUTROABS 5.2  HGB 13.2  HCT 40.4  MCV 91.8  PLT 144    Basic Metabolic Panel:  Lab Results  Component Value Date   NA 139 02/20/2022  K 3.9 02/20/2022   CO2 29 02/20/2022   GLUCOSE 96 02/20/2022   BUN 16 02/20/2022   CREATININE 1.17 02/20/2022   CALCIUM 9.2 02/20/2022   GFRNONAA >60 02/20/2022   GFRAA >60 05/04/2015   Lipid Panel:  Lab Results  Component Value Date   LDLCALC 58 02/21/2022   HgbA1c:  Lab Results  Component Value Date   HGBA1C 4.6 (L) 02/20/2022   Urine Drug Screen: No results found for: "LABOPIA", "COCAINSCRNUR", "LABBENZ", "AMPHETMU", "THCU", "LABBARB"  Alcohol Level No results found for: "ETH"   I have reviewed the images obtained:  CT without contrast 02/20/2022: Minor white matter microvascular changes. No acute intracranial abnormality by noncontrast CT.  MRI brain without contrast 02/20/2022:  Small areas of acute infarct in the right cerebellum, left pons,and right occipital cortex compatible with emboli. Mild chronic microvascular ischemic change in the white matter. Negative MRA head.  MRA head without contrast 02/20/2022: Negative MRA head.   MRA neck with and without contrast 02/20/2022: Negative MRA neck   TTE 02/21/2022: Left venticular Ejection fraction 65 to 70%, no regional wall motion abnormalities, no thrombus, no PFO     ASSESSMENT/PLAN: 81 year old male with new onset right-sided numbness after cardiac cath.  MRI brain showed acute ischemic strokes.  Acute ischemic stroke -Etiology: Likely cardioembolic after cardiac cath  Recommendations: -Recommend aspirin 81 mg daily and Plavix 75 mg daily for 3 weeks followed by aspirin 81 mg daily -Continue atorvastatin 40 mg daily -Recommend 30-day event monitor to look for paroxysmal  A-fib -PT/OT -Goal blood pressure: Normotension -Stroke education including BEFAST EEG -Follow-up with neurology in 3 months -Discussed plan with patient, wife at bedside and Dr. Denton Brick via secure chat  Thank you for allowing [place] to participate in the care of this patient. If you have any further questions, please contact  me or neurohospitalist.   Zeb Comfort [facility] neurohospitalist

## 2022-02-21 NOTE — Progress Notes (Addendum)
  Transition of Care [facility]) Screening Note   Patient Details  Name: Samuel Mendez Date of Birth: 10-05-1940   Transition of Care [facility]) CM/SW Contact:    Ihor Gully, LCSW Phone Number: 02/21/2022, 10:51 AM    Transition of Care Department [facility]) has reviewed patient and no TOC needs have been identified at this time. We will continue to monitor patient advancement through interdisciplinary progression rounds. If new patient transition needs arise, please place a TOC consult.   Your notification ID is: [phone]

## 2022-02-21 NOTE — Discharge Summary (Signed)
JAVIEN TESCH, is a 81 y.o. male  DOB 07-17-1940  MRN [id number].  Admission date:  02/20/2022  Admitting Physician  Bethena Roys, MD  Discharge Date:  02/21/2022   Primary MD  Celene Squibb, MD  Recommendations for primary care physician for things to follow:   1)Please take Aspirin 81 mg daily along with Plavix 75 mg daily for 21 days then after that STOP the Plavix  and continue ONLY Aspirin 81 mg daily indefinitely--for secondary stroke Prevention (Per The multicenter SAMMPRIS trial)  2)Avoid ibuprofen/Advil/Aleve/Motrin/Goody Powders/Naproxen/BC powders/Meloxicam/Diclofenac/Indomethacin and other Nonsteroidal anti-inflammatory medications as these will make you more likely to bleed and can cause stomach ulcers, can also cause Kidney problems.   3)Zio-Patch Heart Monitor to help monitor your heart rate and look for irregular heartbeat for up to 30 days will be sent by mail to your house--- instructions will be included  Admission Diagnosis  Numbness [R20.0] Acute CVA (cerebrovascular accident) [facility]) [I63.9]   Discharge Diagnosis  Numbness [R20.0] Acute CVA (cerebrovascular accident) ([place]) [I63.9]    Principal Problem:   Acute CVA (cerebrovascular accident) [facility]) Active Problems:   Typical angina ([place])   Sinus bradycardia   Essential hypertension   Depression, major, single episode, moderate (HCC)   GAD (generalized anxiety disorder)      Past Medical History:  Diagnosis Date   Anemia, unspecified 09/11/2015   Anemia is due to giving platelets so frequently- we discussed slowing this down.     Anxiety and depression 04/30/2012   Comes and goes, never on medication    BPH (benign prostatic hyperplasia) 05/04/2014   Myrbetriq per Dr. Lenna Gilford for overactive bladder symptoms then came off and did not recur Also saw palmetto. Nocturia 0-1x a night    Cerumen impaction 03/26/2012   COLONIC  POLYPS 04/24/2007   04/2011 adenoma x3- 5 year repeat     Depression, major, single episode, moderate ([place]) 03/27/2017   Deviated septum    Diverticulosis of colon    Diverticulosis of large intestine 04/24/2007   Qualifier: Diagnosis of  By: Lenna Gilford MD, Deborra Medina    DJD (degenerative joint disease)    GAD (generalized anxiety disorder) 03/27/2017   History of adenomatous polyp of colon 06/13/2016   2.2018. 5 year?   Hx of colonic polyps    Hypercholesteremia    Hypertension    MYALGIA 07/29/2007   Qualifier: Diagnosis of  By: Royal Piedra NP, Tammy     NECK PAIN 04/27/2010   Qualifier: Diagnosis of  By: Lenna Gilford MD, Deborra Medina    Obesity    Osteoarthritis 04/22/2007   Cervical (turns whole body at times), hands in past. No rx.      Prostatitis    PROSTATITIS, HX OF 04/22/2007   Qualifier: Diagnosis of  By: Julien Girt CMA, Leigh     RENAL CALCULUS 04/22/2007   x3, [place] doctor released him   Sinusitis, acute 03/26/2012    Past Surgical History:  Procedure Laterality Date   anal fistula repair     in  90s-outpatient repair   colles fracture left wrist after fall  11/2007   COLONOSCOPY     2013   SEPTOPLASTY N/A 05/08/2015   Procedure: NASAL SEPTOPLASTY;  Surgeon: Leta Baptist, MD;  Location: [place];  Service: ENT;  Laterality: N/A;   SINUS ENDO WITH FUSION Bilateral 05/08/2015   Procedure: ENDOSCOPIC BILATERAL  ETHMOIDECTOMY, BILATERAL MAXILLARY ANTROSTOMY, BILATERAL SPHENOIDECTOMY, BILATERAL  FRONTAL RECESS EXPLORATION WITH FUSION NAVIGATION;  Surgeon: Leta Baptist, MD;  Location: [place];  Service: ENT;  Laterality: Bilateral;     HPI  from the history and physical done on the day of admission:   Chief Complaint: Numbness   HPI: Tynell JERMANY RIMEL is a 81 y.o. male with medical history significant for  HTN, GAD, Depression, BPH. Patient presented to the ED with c/o numbness to lateral side of right leg and back of head of 2 days duration. Patient underwent Cardiac cath 10/30-  2 days ago for angina pectoris and Positive cardiac stress test. Cath results- Non-obstructive coronary artery disease. He is still having numbness to the back of his head, but his right lower extremity numbness is improving.  He denies facial asymmetry, no change in speech no change in vision no weakness of extremities, no other focal deficits or numbness daughter part of the body. Patient stopped taking aspirin about 2 to 3 years ago.     ED Course: Bradycardic heart rate 42-50.  Blood pressure 140s to 180s.  Troponin 13> 14. CT showing minor white matter microvascular changes. MRI brain showed small areas of acute infarct in the right cerebellum and left pons and right occipital cortex compatible with emboli.  MRA head and neck negative. Neuro hospitalist Dr. Malen Gauze recommends stroke work-up to include echo, lipid panel, A1c, formal neurology consult in a.m., aspirin and Plavix, statins.   Review of Systems: As per HPI all other systems reviewed and negative.     Hospital Course:     Assessment and Plan: 1) Acute CVA (cerebrovascular accident) ([place]) Presenting with numbness to back of head and lateral aspect of right lower extremity.  - Patient had cardiac cath  at [facility] on 02/18/22, status post right a radial artery.  Catheter likely dislodged cholesterol/atherosclerotic plaques/debris in artery with resultant infarct. -MRI brain- Small areas of acute infarct in the right cerebellum, left pons, and right occipital cortex compatible with emboli.  -Please see allergy consult from Dr. Hortense Ramal --Echo with EF of 65 to 70%, moderate LVH, grade 1 diastolic dysfunction, without aortic stenosis or mitral stenosis -LDL 58 HDL 46 total cholesterol 116--Even if his lipid panel is within desired limits, patient should still take Lipitor for it's Pleiotropic effects (beyond cholesterol lowering benefits) -A1C 4.6 Please take Aspirin 81 mg daily along with Plavix 75 mg daily for 21 days  then after that STOP the Plavix  and continue ONLY Aspirin 81 mg daily indefinitely--for secondary stroke Prevention (Per The multicenter SAMMPRIS trial) Zio patch monitor advised, cardiology clinic will send him the zio patch -Outpatient follow-up with neurology advised   2)Chest Pain--- Troponin 13 > 14.  Had cardiac cath 02/18/22 at [facility] without significant obstructive CAD or interventions.   --Results of cardiac cath- nonobstructive CAD, High left ventricle end diastolic pressure. -Recommendation was to maximize medical therapy -Nitro as needed -Aspirin and Plavix as above, Lipitor as advised -TSH WNL -Magnesium normal 1.9  - 9/27  GAD (generalized anxiety disorder) C/n  Celexa  Essential hypertension -Stable, continue benazepril,  HCTZ and metoprolol 12.5 mg  Discharge Condition: Stable  Follow UP   Follow-up Information     Garvin Fila, MD. Schedule an appointment as soon as possible for a visit in 3 month(s).   Specialties: Neurology, Radiology Why: stroke f/u Contact information: [address] [place] [address] [phone]                 Consults obtained -neurology  Diet and Activity recommendation:  As advised  Discharge Instructions    Discharge Instructions     Call MD for:  difficulty breathing, headache or visual disturbances   Complete by: As directed    Call MD for:  persistant dizziness or light-headedness   Complete by: As directed    Call MD for:  persistant nausea and vomiting   Complete by: As directed    Call MD for:  temperature >100.4   Complete by: As directed    Diet - low sodium heart healthy   Complete by: As directed    Discharge instructions   Complete by: As directed    1)Please take Aspirin 81 mg daily along with Plavix 75 mg daily for 21 days then after that STOP the Plavix  and continue ONLY Aspirin 81 mg daily indefinitely--for secondary stroke Prevention (Per The multicenter SAMMPRIS  trial)  2)Avoid ibuprofen/Advil/Aleve/Motrin/Goody Powders/Naproxen/BC powders/Meloxicam/Diclofenac/Indomethacin and other Nonsteroidal anti-inflammatory medications as these will make you more likely to bleed and can cause stomach ulcers, can also cause Kidney problems.   3)Zio-Patch Heart Monitor to help monitor your heart rate and look for irregular heartbeat for up to 30 days will be sent by mail to your house--- instructions will be included   Increase activity slowly   Complete by: As directed          Discharge Medications     Allergies as of 02/21/2022   No Known Allergies      Medication List     TAKE these medications    acetaminophen 325 MG tablet Commonly known as: TYLENOL Take 2 tablets (650 mg total) by mouth every 4 (four) hours as needed for mild pain (or temp > 37.5 C (99.5 F)).   aspirin EC 81 MG tablet Take 1 tablet (81 mg total) by mouth daily with breakfast. Swallow whole.   atorvastatin 40 MG tablet Commonly known as: LIPITOR Take 1 tablet (40 mg total) by mouth daily. Start taking on: February 22, 2022 What changed:  medication strength how much to take   benazepril 40 MG tablet Commonly known as: LOTENSIN Take 40 mg by mouth daily.   citalopram 10 MG tablet Commonly known as: CELEXA Take 1 tablet (10 mg total) by mouth daily.   clopidogrel 75 MG tablet Commonly known as: PLAVIX Take 1 tablet (75 mg total) by mouth daily. Take Aspirin 81 mg daily along with Plavix 75 mg daily for 21 days then after that STOP the Plavix  and continue ONLY Aspirin 81 mg daily indefinitely--for secondary stroke Prevention Start taking on: February 22, 2022   Fish Oil 1000 MG Caps Take 1 capsule by mouth daily.   hydrochlorothiazide 25 MG tablet Commonly known as: HYDRODIURIL Take 25 mg by mouth daily.   metoprolol succinate 25 MG 24 hr tablet Commonly known as: TOPROL-XL Take 0.5 tablets (12.5 mg total) by mouth daily.   MULTIVITAMIN ADULT PO Take by  mouth.   nitroGLYCERIN 0.3 MG SL tablet Commonly known as: NITROSTAT Place 0.3 mg under the tongue every 5 (five)  minutes as needed for chest pain.   OVER THE COUNTER MEDICATION Take 450 mg by mouth daily. Saw Palmetto   OVER THE COUNTER MEDICATION Take 1 tablet by mouth daily. Beets chews   vitamin C 1000 MG tablet Take 1,000 mg by mouth daily.   zinc gluconate 50 MG tablet Take 50 mg by mouth daily.        Major procedures and Radiology Reports - PLEASE review detailed and final reports for all details, in brief -   ECHOCARDIOGRAM COMPLETE  Result Date: 02/21/2022    ECHOCARDIOGRAM REPORT   Patient Name:   REYAN HELLE [facility] Date of Exam: 02/21/2022 Medical Rec #:  [id number]        Height:       72.0 in Accession #:    [id number]       Weight:       217.9 lb Date of Birth:  1940-09-23       BSA:          2.210 m Patient Age:    69 years         BP:           112/61 mmHg Patient Gender: M                HR:           44 bpm. Exam Location:  [facility] Procedure: 2D Echo, Cardiac Doppler and Color Doppler Indications:    Stroke  History:        Patient has no prior history of Echocardiogram examinations.                 Stroke, Arrythmias:Bradycardia, Signs/Symptoms:Chest Pain; Risk                 Factors:Hypertension.  Sonographer:    Wenda Low Referring Phys: [place]  1. Left ventricular ejection fraction, by estimation, is 65 to 70%. The left ventricle has normal function. The left ventricle has no regional wall motion abnormalities. There is moderate left ventricular hypertrophy. Left ventricular diastolic parameters are consistent with Grade I diastolic dysfunction (impaired relaxation).  2. Right ventricular systolic function is normal. The right ventricular size is normal. There is normal pulmonary artery systolic pressure. The estimated right ventricular systolic pressure is 82.9 mmHg.  3. Left atrial size was moderately dilated.  4. Right atrial  size was mildly dilated.  5. The mitral valve is degenerative. Trivial mitral valve regurgitation. No evidence of mitral stenosis. The mean mitral valve gradient is 2.0 mmHg. Moderate mitral annular calcification.  6. The aortic valve is tricuspid. Aortic valve regurgitation is not visualized. Aortic valve sclerosis is present, with no evidence of aortic valve stenosis. Aortic valve mean gradient measures 5.0 mmHg.  7. Aortic dilatation noted. There is mild dilatation of the aortic root, measuring 42 mm.  8. The inferior vena cava is normal in size with greater than 50% respiratory variability, suggesting right atrial pressure of 3 mmHg. Comparison(s): No prior Echocardiogram. FINDINGS  Left Ventricle: Left ventricular ejection fraction, by estimation, is 65 to 70%. The left ventricle has normal function. The left ventricle has no regional wall motion abnormalities. The left ventricular internal cavity size was normal in size. There is  moderate left ventricular hypertrophy. Left ventricular diastolic parameters are consistent with Grade I diastolic dysfunction (impaired relaxation). Right Ventricle: The right ventricular size is normal. No increase in right ventricular wall thickness. Right ventricular systolic function is normal. There is  normal pulmonary artery systolic pressure. The tricuspid regurgitant velocity is 2.63 m/s, and  with an assumed right atrial pressure of 3 mmHg, the estimated right ventricular systolic pressure is 60.7 mmHg. Left Atrium: Left atrial size was moderately dilated. Right Atrium: Right atrial size was mildly dilated. Pericardium: There is no evidence of pericardial effusion. Mitral Valve: The mitral valve is degenerative in appearance. There is mild calcification of the mitral valve leaflet(s). Moderate mitral annular calcification. Trivial mitral valve regurgitation. No evidence of mitral valve stenosis. MV peak gradient, 6.4 mmHg. The mean mitral valve gradient is 2.0 mmHg.  Tricuspid Valve: The tricuspid valve is grossly normal. Tricuspid valve regurgitation is trivial. Aortic Valve: The aortic valve is tricuspid. There is mild aortic valve annular calcification. Aortic valve regurgitation is not visualized. Aortic valve sclerosis is present, with no evidence of aortic valve stenosis. Aortic valve mean gradient measures  5.0 mmHg. Aortic valve peak gradient measures 10.9 mmHg. Aortic valve area, by VTI measures 3.39 cm. Pulmonic Valve: The pulmonic valve was grossly normal. Pulmonic valve regurgitation is trivial. Aorta: Aortic dilatation noted. There is mild dilatation of the aortic root, measuring 42 mm. Venous: The inferior vena cava is normal in size with greater than 50% respiratory variability, suggesting right atrial pressure of 3 mmHg. IAS/Shunts: No atrial level shunt detected by color flow Doppler.  LEFT VENTRICLE PLAX 2D LVIDd:         4.20 cm   Diastology LVIDs:         2.10 cm   LV e' medial:    4.90 cm/s LV PW:         1.30 cm   LV E/e' medial:  21.2 LV IVS:        1.40 cm   LV e' lateral:   6.74 cm/s LVOT diam:     2.20 cm   LV E/e' lateral: 15.4 LV SV:         130 LV SV Index:   59 LVOT Area:     3.80 cm  RIGHT VENTRICLE RV Basal diam:  4.55 cm RV Mid diam:    3.40 cm RV S prime:     16.00 cm/s TAPSE (M-mode): 3.2 cm LEFT ATRIUM              Index        RIGHT ATRIUM           Index LA diam:        4.50 cm  2.04 cm/m   RA Area:     27.20 cm LA Vol (A2C):   109.0 ml 49.33 ml/m  RA Volume:   80.20 ml  36.30 ml/m LA Vol (A4C):   91.5 ml  41.41 ml/m LA Biplane Vol: 102.0 ml 46.16 ml/m  AORTIC VALVE                     PULMONIC VALVE AV Area (Vmax):    3.50 cm      PV Vmax:       1.07 m/s AV Area (Vmean):   3.61 cm      PV Peak grad:  4.6 mmHg AV Area (VTI):     3.39 cm AV Vmax:           165.00 cm/s AV Vmean:          103.000 cm/s AV VTI:            0.383 m AV Peak Grad:      10.9  mmHg AV Mean Grad:      5.0 mmHg LVOT Vmax:         152.00 cm/s LVOT Vmean:         97.800 cm/s LVOT VTI:          0.342 m LVOT/AV VTI ratio: 0.89  AORTA Ao Root diam: 4.20 cm Ao Asc diam:  4.20 cm MITRAL VALVE                TRICUSPID VALVE MV Area (PHT): 2.55 cm     TR Peak grad:   27.7 mmHg MV Area VTI:   2.43 cm     TR Vmax:        263.00 cm/s MV Peak grad:  6.4 mmHg MV Mean grad:  2.0 mmHg     SHUNTS MV Vmax:       1.26 m/s     Systemic VTI:  0.34 m MV Vmean:      56.1 cm/s    Systemic Diam: 2.20 cm MV Decel Time: 297 msec MV E velocity: 104.00 cm/s MV A velocity: 111.00 cm/s MV E/A ratio:  0.94 Rozann Lesches MD Electronically signed by Rozann Lesches MD Signature Date/Time: 02/21/2022/9:40:46 AM    Final    MR Angiogram Neck W or Wo Contrast  Result Date: 02/20/2022 CLINICAL DATA:  Acute neuro deficit.  Stroke. EXAM: MRA NECK WITHOUT AND WITH CONTRAST TECHNIQUE: Multiplanar and multiecho pulse sequences of the neck were obtained without and with intravenous contrast. Angiographic images of the neck were obtained using MRA technique without and with intravenous contrast, using 3D post processing. CONTRAST:  35m GADAVIST GADOBUTROL 1 MMOL/ML IV SOLN COMPARISON:  None Available. FINDINGS: Normal aortic arch.  Proximal great vessels widely patent. Internal carotid artery widely patent bilaterally without stenosis. Both vertebral arteries are patent without stenosis. IMPRESSION: Negative MRA neck Electronically Signed   By: CFranchot GalloM.D.   On: 02/20/2022 19:00   MR BRAIN WO CONTRAST  Result Date: 02/20/2022 CLINICAL DATA:  Acute neuro deficit.  Rule out stroke EXAM: MRI HEAD WITHOUT CONTRAST MRA HEAD WITHOUT CONTRAST TECHNIQUE: Multiplanar, multi-echo pulse sequences of the brain and surrounding structures were acquired without intravenous contrast. Angiographic images of the Circle of Willis were acquired using MRA technique without intravenous contrast. COMPARISON:  CT head 02/20/2022 FINDINGS: MRI HEAD FINDINGS Brain: Small areas of acute infarct right cerebellum, left pons,  right occipital cortex. Scattered small white matter hyperintensities compatible with chronic microvascular ischemia. Negative for hemorrhage or mass. No hydrocephalus Vascular: Normal arterial flow voids. Skull and upper cervical spine: No focal skeletal lesion. Sinuses/Orbits: Mucosal edema paranasal sinuses diffusely. Negative orbit Other: None MRA HEAD FINDINGS Anterior circulation: Internal carotid artery widely patent bilaterally. Anterior and middle cerebral arteries patent without stenosis or aneurysm. Posterior circulation: Both vertebral arteries patent to the basilar. Basilar patent. Superior cerebellar and posterior cerebral arteries patent bilaterally. No stenosis or vascular malformation Anatomic variants: None IMPRESSION: 1. Small areas of acute infarct in the right cerebellum, left pons, and right occipital cortex compatible with emboli. Mild chronic microvascular ischemic change in the white matter. 2. Negative MRA head. Electronically Signed   By: CFranchot GalloM.D.   On: 02/20/2022 18:52   MR ANGIO HEAD WO CONTRAST  Result Date: 02/20/2022 CLINICAL DATA:  Acute neuro deficit.  Rule out stroke EXAM: MRI HEAD WITHOUT CONTRAST MRA HEAD WITHOUT CONTRAST TECHNIQUE: Multiplanar, multi-echo pulse sequences of the brain and surrounding structures were acquired without intravenous contrast. Angiographic images of the  Circle of Willis were acquired using MRA technique without intravenous contrast. COMPARISON:  CT head 02/20/2022 FINDINGS: MRI HEAD FINDINGS Brain: Small areas of acute infarct right cerebellum, left pons, right occipital cortex. Scattered small white matter hyperintensities compatible with chronic microvascular ischemia. Negative for hemorrhage or mass. No hydrocephalus Vascular: Normal arterial flow voids. Skull and upper cervical spine: No focal skeletal lesion. Sinuses/Orbits: Mucosal edema paranasal sinuses diffusely. Negative orbit Other: None MRA HEAD FINDINGS Anterior circulation:  Internal carotid artery widely patent bilaterally. Anterior and middle cerebral arteries patent without stenosis or aneurysm. Posterior circulation: Both vertebral arteries patent to the basilar. Basilar patent. Superior cerebellar and posterior cerebral arteries patent bilaterally. No stenosis or vascular malformation Anatomic variants: None IMPRESSION: 1. Small areas of acute infarct in the right cerebellum, left pons, and right occipital cortex compatible with emboli. Mild chronic microvascular ischemic change in the white matter. 2. Negative MRA head. Electronically Signed   By: Franchot Gallo M.D.   On: 02/20/2022 18:52   CT Head Wo Contrast  Result Date: 02/20/2022 CLINICAL DATA:  Right-sided paresthesias for 2 days EXAM: CT HEAD WITHOUT CONTRAST TECHNIQUE: Contiguous axial images were obtained from the base of the skull through the vertex without intravenous contrast. RADIATION DOSE REDUCTION: This exam was performed according to the departmental dose-optimization program which includes automated exposure control, adjustment of the mA and/or kV according to patient size and/or use of iterative reconstruction technique. COMPARISON:  01/07/2015 maxillofacial CT FINDINGS: Brain: Minor scattered white matter microvascular ischemic changes throughout both cerebral hemispheres. No acute intracranial hemorrhage, new mass lesion, definite new infarction, midline shift, herniation, hydrocephalus, or extra-axial fluid collection. No focal mass effect or edema. Cisterns are patent. No cerebellar abnormality. Vascular: No hyperdense vessel or unexpected calcification. Skull: Normal. Negative for fracture or focal lesion. Sinuses/Orbits: No acute finding. Other: None. IMPRESSION: 1. Minor white matter microvascular changes. 2. No acute intracranial abnormality by noncontrast CT. Electronically Signed   By: Jerilynn Mages.  Shick M.D.   On: 02/20/2022 17:24    Today   Subjective    Deward Hodapp today has no new complaints,   -No chest pain palpitations no dizziness         Patient has been seen and examined prior to discharge   Objective   Blood pressure (!) 114/55, pulse (!) 51, temperature 97.8 F (36.6 C), temperature source Oral, resp. rate 18, height 6' (1.829 m), weight 98.8 kg, SpO2 96 %.   Intake/Output Summary (Last 24 hours) at 02/21/2022 1557 Last data filed at 02/21/2022 1300 Gross per 24 hour  Intake 1317 ml  Output --  Net 1317 ml    Exam Gen:- Awake Alert, no acute distress  HEENT:- [place].AT, No sclera icterus Neck-Supple Neck,No JVD,.  Lungs-  CTAB , good air movement bilaterally CV- S1, S2 normal, regular Abd-  +ve B.Sounds, Abd Soft, No tenderness,    Extremity/Skin:- No  edema,   good pulses Psych-affect is appropriate, oriented x3 Neuro-no additional new focal deficits, no tremors    Data Review   CBC w Diff:  Lab Results  Component Value Date   WBC 7.2 02/20/2022   HGB 13.2 02/20/2022   HCT 40.4 02/20/2022   PLT 183 02/20/2022   LYMPHOPCT 15 02/20/2022   MONOPCT 10 02/20/2022   EOSPCT 1 02/20/2022   BASOPCT 1 02/20/2022    CMP:  Lab Results  Component Value Date   NA 139 02/20/2022   K 3.9 02/20/2022   CL 104 02/20/2022   CO2 29  02/20/2022   BUN 16 02/20/2022   CREATININE 1.17 02/20/2022   PROT 7.6 02/20/2022   ALBUMIN 4.2 02/20/2022   BILITOT 0.8 02/20/2022   ALKPHOS 52 02/20/2022   AST 18 02/20/2022   ALT 17 02/20/2022  .  Total Discharge time is about 33 minutes  Roxan Hockey M.D on 02/21/2022 at 3:57 PM  Go to [email or web address] -  for contact info  [facility] - Office  [phone]

## 2022-02-26 DIAGNOSIS — G464 Cerebellar stroke syndrome: Secondary | ICD-10-CM | POA: Diagnosis not present

## 2022-02-27 DIAGNOSIS — H2513 Age-related nuclear cataract, bilateral: Secondary | ICD-10-CM | POA: Diagnosis not present

## 2022-02-27 DIAGNOSIS — H524 Presbyopia: Secondary | ICD-10-CM | POA: Diagnosis not present

## 2022-02-27 DIAGNOSIS — H5203 Hypermetropia, bilateral: Secondary | ICD-10-CM | POA: Diagnosis not present

## 2022-02-28 ENCOUNTER — Ambulatory Visit: Payer: Non-veteran care | Attending: Cardiology

## 2022-02-28 DIAGNOSIS — I639 Cerebral infarction, unspecified: Secondary | ICD-10-CM

## 2022-04-03 ENCOUNTER — Ambulatory Visit (INDEPENDENT_AMBULATORY_CARE_PROVIDER_SITE_OTHER): Payer: Medicare HMO | Admitting: Neurology

## 2022-04-03 ENCOUNTER — Encounter: Payer: Self-pay | Admitting: Neurology

## 2022-04-03 VITALS — BP 152/77 | HR 49 | Ht 72.0 in | Wt 231.4 lb

## 2022-04-03 DIAGNOSIS — I6310 Cerebral infarction due to embolism of unspecified precerebral artery: Secondary | ICD-10-CM

## 2022-04-03 DIAGNOSIS — E7849 Other hyperlipidemia: Secondary | ICD-10-CM

## 2022-04-03 DIAGNOSIS — R200 Anesthesia of skin: Secondary | ICD-10-CM

## 2022-04-03 NOTE — Progress Notes (Signed)
[facility] [address] [place] [address] [phone]       OFFICE CONSULT NOTE  Mr. Samuel Mendez Date of Birth:  21-Mar-1941 Medical Record Number:  [id number]   Referring MD: Dr. Joesph Fillers  Reason for Referral: Stroke  HPI: [name] is a 81 year old pleasant Caucasian male seen today for initial consultation visit for stroke.  History is obtained from the patient and his wife as well as review of electronic medical records.  I have reviewed pertinent available imaging films and PACS.Samuel Mendez is a 81 y.o. male with past medical history of hypertension, hyperlipidemia who presented to the emergency room with right-sided numbness.  Patient states he had a cardiac cath on 02/18/2022 at [facility] which got done at around 3 PM.  He got home by around 6 PM and noticed numbness on the right side of his face arm and leg.  States he initially thought this was effect of anesthesia.  However his symptoms were worse on Tuesday.  Eventually on Wednesday he called [facility] back and they told him to come to the emergency room.  CT head without contrast on 02/20/2022 showed minor white matter changes without acute abnormality.  MRI scan of the brain showed small area of acute infarct in the right cerebellum, left pontine and right occipital cortex.  MRI of the brain showed no large vessel stenosis or occlusion.  MRI of the neck also showed no large vessel stenosis.  Transthoracic echo showed ejection fraction of 65 to 70% without regional wall motion abnormalities.  LDL cholesterol 109 mg percent.  Hemoglobin A1c was 4.6.  Patient was started on dual antiplatelet therapy aspirin and Plavix for 3 weeks followed by.  Patient states he is doing much better.  He still has some numbness in his right leg and hip from time to time much better.  Currently.  He is tolerating aspirin well without bruising or bleeding.  Blood pressure is well-controlled.  He is  tolerating Lipitor well without muscle aches and pains.  He has no new complaints today.  ROS:   14 system review of systems is positive for numbness, weakness, slurred speech and all other systems negative  PMH:  Past Medical History:  Diagnosis Date   Anemia, unspecified 09/11/2015   Anemia is due to giving platelets so frequently- we discussed slowing this down.     Anxiety and depression 04/30/2012   Comes and goes, never on medication    BPH (benign prostatic hyperplasia) 05/04/2014   Myrbetriq per Dr. Lenna Gilford for overactive bladder symptoms then came off and did not recur Also saw palmetto. Nocturia 0-1x a night    Cerumen impaction 03/26/2012   COLONIC POLYPS 04/24/2007   04/2011 adenoma x3- 5 year repeat     Depression, major, single episode, moderate ([place]) 03/27/2017   Deviated septum    Diverticulosis of colon    Diverticulosis of large intestine 04/24/2007   Qualifier: Diagnosis of  By: Lenna Gilford MD, Deborra Medina    DJD (degenerative joint disease)    GAD (generalized anxiety disorder) 03/27/2017   History of adenomatous polyp of colon 06/13/2016   2.2018. 5 year?   Hx of colonic polyps    Hypercholesteremia    Hypertension    MYALGIA 07/29/2007   Qualifier: Diagnosis of  By: Royal Piedra NP, Tammy     NECK PAIN 04/27/2010   Qualifier: Diagnosis of  By: Lenna Gilford MD, Deborra Medina    Obesity    Osteoarthritis 04/22/2007  Cervical (turns whole body at times), hands in past. No rx.      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 3 children, 2 from current wife. 4 grandkids. 1 greatgrandson 01/2019 - in [place]. Retired from truck driving.       Hobbies: TV, travel   Social Determinants of Radio broadcast assistant Strain: Not on file  Food Insecurity: Not on file  Transportation Needs: Not on file  Physical Activity: Not on file  Stress: Not on file  Social Connections: Not on file  Intimate Partner Violence: Not on file    Medications:   Current Outpatient Medications on File Prior to Visit  Medication Sig Dispense Refill   acetaminophen (TYLENOL) 325 MG tablet Take 2 tablets (650 mg total) by mouth every 4 (four) hours as needed for mild pain (or temp > 37.5 C (99.5 F)). 30 tablet 0   Ascorbic Acid (VITAMIN C) 1000 MG tablet Take 1,000 mg by mouth daily.     aspirin EC 81 MG tablet Take 1 tablet (81 mg total) by mouth daily with breakfast. Swallow whole. 30 tablet 11   atorvastatin (LIPITOR) 40 MG tablet Take 1 tablet (40 mg total) by mouth daily. 30 tablet 3   benazepril (LOTENSIN) 40 MG tablet Take 40 mg by mouth daily.     citalopram (CELEXA) 10 MG tablet Take 1 tablet (10 mg total) by mouth daily. 90 tablet 3   hydrochlorothiazide (HYDRODIURIL) 25 MG tablet Take 25 mg by mouth daily.     metoprolol succinate (TOPROL-XL) 25 MG 24 hr tablet Take 0.5 tablets (12.5 mg total) by mouth daily. 45 tablet 2   Multiple Vitamins-Minerals (MULTIVITAMIN ADULT PO) Take by mouth.     Omega-3 Fatty Acids (FISH OIL) 1000 MG CAPS Take 1 capsule  by mouth daily.     OVER THE COUNTER MEDICATION Take 450 mg by mouth daily. Saw Palmetto     OVER THE COUNTER MEDICATION Take 1 tablet by mouth daily. Beets chews     zinc gluconate 50 MG tablet Take 50 mg by mouth as needed.     clopidogrel (PLAVIX) 75 MG tablet Take 1 tablet (75 mg total) by mouth daily. Take Aspirin 81 mg daily along with Plavix 75 mg daily for 21 days then after that STOP the  Plavix  and continue ONLY Aspirin 81 mg daily indefinitely--for secondary stroke Prevention 30 tablet 0   nitroGLYCERIN (NITROSTAT) 0.3 MG SL tablet Place 0.3 mg under the tongue every 5 (five) minutes as needed for chest pain. (Patient not taking: Reported on 04/03/2022)     No current facility-administered medications on file prior to visit.    Allergies:  No Known Allergies  Physical Exam General: well developed, well nourished pleasant elderly Caucasian male, seated, in no evident distress Head: head normocephalic and atraumatic.   Neck: supple with no carotid or supraclavicular bruits Cardiovascular: regular rate and rhythm, no murmurs Musculoskeletal: no deformity Skin:  no rash/petichiae Vascular:  Normal pulses all extremities  Neurologic Exam Mental Status: Awake and fully alert. Oriented to place and time. Recent and remote memory intact. Attention span, concentration and fund of knowledge appropriate. Mood and affect appropriate.  Cranial Nerves: Fundoscopic exam reveals sharp disc margins. Pupils equal, briskly reactive to light. Extraocular movements full without nystagmus. Visual fields full to confrontation. Hearing intact. Facial sensation intact. Face, tongue, palate moves normally and symmetrically.  Motor: Normal bulk and tone. Normal strength in all tested extremity muscles. Sensory.: intact to touch , pinprick , position and vibratory sensation.  Mild subjective paresthesias in the right hip and leg Coordination: Rapid alternating movements normal in all extremities.  Finger-to-nose and heel-to-shin performed accurately bilaterally. Gait and Station: Arises from chair without difficulty. Stance is normal. Gait demonstrates normal stride length and balance . Able to heel, toe and tandem walk with mild difficulty.  Reflexes: 1+ and symmetric. Toes downgoing.   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  Vascular risk factors of hyperlipidemia, hypertension and coronary artery disease   PLAN:I had a long d/w patient about his recent post cardiac catherization strokes, rresidual numbness,isk for recurrent stroke/TIAs, personally independently reviewed imaging studies and stroke evaluation results and answered questions.Continue aspirin 81 mg daily  for secondary stroke prevention and maintain strict control of hypertension with blood pressure goal below 130/90, diabetes with hemoglobin A1c goal below 6.5% and lipids with LDL cholesterol goal below 70 mg/dL. I also advised the patient to eat a healthy diet with plenty of whole grains, cereals, fruits and vegetables, exercise regularly and maintain ideal body weight Followup in the future with [facility] practitioner in 6 months or call earlier if needed. Greater than 50% time during this 45-minute consultation visit was spent on counseling and coordination of care about his postcardiac cath strokes and discussion about stroke prevention and treatment and answering questions. Antony Contras, MD Note: This document was prepared with digital dictation and possible smart phrase technology. Any transcriptional errors that result from this process are unintentional.

## 2022-04-03 NOTE — Patient Instructions (Signed)
I had a long d/w patient about his recent post cardiac catherization strokes, rresidual numbness,isk for recurrent stroke/TIAs, personally independently reviewed imaging studies and stroke evaluation results and answered questions.Continue aspirin 81 mg daily  for secondary stroke prevention and maintain strict control of hypertension with blood pressure goal below 130/90, diabetes with hemoglobin A1c goal below 6.5% and lipids with LDL cholesterol goal below 70 mg/dL. I also advised the patient to eat a healthy diet with plenty of whole grains, cereals, fruits and vegetables, exercise regularly and maintain ideal body weight Followup in the future with [facility] practitioner in 6 months or call earlier if needed.  Stroke Prevention Some medical conditions and behaviors can lead to a higher chance of having a stroke. You can help prevent a stroke by eating healthy, exercising, not smoking, and managing any medical conditions you have. Stroke is a leading cause of functional impairment. Primary prevention is particularly important because a majority of strokes are first-time events. Stroke changes the lives of not only those who experience a stroke but also their family and other caregivers. How can this condition affect me? A stroke is a medical emergency and should be treated right away. A stroke can lead to brain damage and can sometimes be life-threatening. If a person gets medical treatment right away, there is a better chance of surviving and recovering from a stroke. What can increase my risk? The following medical conditions may increase your risk of a stroke: Cardiovascular disease. High blood pressure (hypertension). Diabetes. High cholesterol. Sickle cell disease. Blood clotting disorders (hypercoagulable state). Obesity. Sleep disorders (obstructive sleep apnea). Other risk factors include: Being older than age 57. Having a history of blood clots, stroke, or mini-stroke (transient  ischemic attack, TIA). Genetic factors, such as race, ethnicity, or a family history of stroke. Smoking cigarettes or using other tobacco products. Taking birth control pills, especially if you also use tobacco. Heavy use of alcohol or drugs, especially cocaine and methamphetamine. Physical inactivity. What actions can I take to prevent this? Manage your health conditions High cholesterol levels. Eating a healthy diet is important for preventing high cholesterol. If cholesterol cannot be managed through diet alone, you may need to take medicines. Take any prescribed medicines to control your cholesterol as told by your health care provider. Hypertension. To reduce your risk of stroke, try to keep your blood pressure below 130/80. Eating a healthy diet and exercising regularly are important for controlling blood pressure. If these steps are not enough to manage your blood pressure, you may need to take medicines. Take any prescribed medicines to control hypertension as told by your health care provider. Ask your health care provider if you should monitor your blood pressure at home. Have your blood pressure checked every year, even if your blood pressure is normal. Blood pressure increases with age and some medical conditions. Diabetes. Eating a healthy diet and exercising regularly are important parts of managing your blood sugar (glucose). If your blood sugar cannot be managed through diet and exercise, you may need to take medicines. Take any prescribed medicines to control your diabetes as told by your health care provider. Get evaluated for obstructive sleep apnea. Talk to your health care provider about getting a sleep evaluation if you snore a lot or have excessive sleepiness. Make sure that any other medical conditions you have, such as atrial fibrillation or atherosclerosis, are managed. Nutrition Follow instructions from your health care provider about what to eat or drink to help  manage your health  condition. These instructions may include: Reducing your daily calorie intake. Limiting how much salt (sodium) you use to 1,500 milligrams (mg) each day. Using only healthy fats for cooking, such as olive oil, canola oil, or sunflower oil. Eating healthy foods. You can do this by: Choosing foods that are high in fiber, such as whole grains, and fresh fruits and vegetables. Eating at least 5 servings of fruits and vegetables a day. Try to fill one-half of your plate with fruits and vegetables at each meal. Choosing lean protein foods, such as lean cuts of meat, poultry without skin, fish, tofu, beans, and nuts. Eating low-fat dairy products. Avoiding foods that are high in sodium. This can help lower blood pressure. Avoiding foods that have saturated fat, trans fat, and cholesterol. This can help prevent high cholesterol. Avoiding processed and prepared foods. Counting your daily carbohydrate intake.  Lifestyle If you drink alcohol: Limit how much you have to: 0-1 drink a day for women who are not pregnant. 0-2 drinks a day for men. Know how much alcohol is in your drink. In the [place] one drink equals one 12 oz bottle of beer (363m), one 5 oz glass of wine (1492m, or one 1 oz glass of hard liquor (4458m Do not use any products that contain nicotine or tobacco. These products include cigarettes, chewing tobacco, and vaping devices, such as e-cigarettes. If you need help quitting, ask your health care provider. Avoid secondhand smoke. Do not use drugs. Activity  Try to stay at a healthy weight. Get at least 30 minutes of exercise on most days, such as: Fast walking. Biking. Swimming. Medicines Take over-the-counter and prescription medicines only as told by your health care provider. Aspirin or blood thinners (antiplatelets or anticoagulants) may be recommended to reduce your risk of forming blood clots that can lead to stroke. Avoid taking birth control pills.  Talk to your health care provider about the risks of taking birth control pills if: You are over 35 59ars old. You smoke. You get very bad headaches. You have had a blood clot. Where to find more information American Stroke Association: www.strokeassociation.org Get help right away if: You or a loved one has any symptoms of a stroke. "BE FAST" is an easy way to remember the main warning signs of a stroke: B - Balance. Signs are dizziness, sudden trouble walking, or loss of balance. E - Eyes. Signs are trouble seeing or a sudden change in vision. F - Face. Signs are sudden weakness or numbness of the face, or the face or eyelid drooping on one side. A - Arms. Signs are weakness or numbness in an arm. This happens suddenly and usually on one side of the body. S - Speech. Signs are sudden trouble speaking, slurred speech, or trouble understanding what people say. T - Time. Time to call emergency services. Write down what time symptoms started. You or a loved one has other signs of a stroke, such as: A sudden, severe headache with no known cause. Nausea or vomiting. Seizure. These symptoms may represent a serious problem that is an emergency. Do not wait to see if the symptoms will go away. Get medical help right away. Call your local emergency services (911 in the [place] Do not drive yourself to the hospital. Summary You can help to prevent a stroke by eating healthy, exercising, not smoking, limiting alcohol intake, and managing any medical conditions you may have. Do not use any products that contain nicotine or tobacco. These include cigarettes, chewing  tobacco, and vaping devices, such as e-cigarettes. If you need help quitting, ask your health care provider. Remember "BE FAST" for warning signs of a stroke. Get help right away if you or a loved one has any of these signs. This information is not intended to replace advice given to you by your health care provider. Make sure you discuss any  questions you have with your health care provider. Document Revised: 11/08/2019 Document Reviewed: 11/08/2019 Elsevier Patient Education  [place].

## 2022-04-29 ENCOUNTER — Ambulatory Visit: Payer: Medicare HMO | Admitting: Neurology

## 2022-05-20 DIAGNOSIS — I10 Essential (primary) hypertension: Secondary | ICD-10-CM | POA: Diagnosis not present

## 2022-05-20 DIAGNOSIS — I639 Cerebral infarction, unspecified: Secondary | ICD-10-CM | POA: Diagnosis not present

## 2022-05-28 DIAGNOSIS — J069 Acute upper respiratory infection, unspecified: Secondary | ICD-10-CM | POA: Diagnosis not present

## 2022-05-28 DIAGNOSIS — R059 Cough, unspecified: Secondary | ICD-10-CM | POA: Diagnosis not present

## 2022-09-11 DIAGNOSIS — I10 Essential (primary) hypertension: Secondary | ICD-10-CM | POA: Diagnosis not present

## 2022-09-11 DIAGNOSIS — N400 Enlarged prostate without lower urinary tract symptoms: Secondary | ICD-10-CM | POA: Diagnosis not present

## 2022-09-11 DIAGNOSIS — E785 Hyperlipidemia, unspecified: Secondary | ICD-10-CM | POA: Diagnosis not present

## 2022-09-30 DIAGNOSIS — Z0000 Encounter for general adult medical examination without abnormal findings: Secondary | ICD-10-CM | POA: Diagnosis not present

## 2022-10-01 DIAGNOSIS — F411 Generalized anxiety disorder: Secondary | ICD-10-CM | POA: Diagnosis not present

## 2022-10-01 DIAGNOSIS — D6959 Other secondary thrombocytopenia: Secondary | ICD-10-CM | POA: Diagnosis not present

## 2022-10-01 DIAGNOSIS — Z0000 Encounter for general adult medical examination without abnormal findings: Secondary | ICD-10-CM | POA: Diagnosis not present

## 2022-10-01 DIAGNOSIS — E785 Hyperlipidemia, unspecified: Secondary | ICD-10-CM | POA: Diagnosis not present

## 2022-10-01 DIAGNOSIS — N400 Enlarged prostate without lower urinary tract symptoms: Secondary | ICD-10-CM | POA: Diagnosis not present

## 2022-10-01 DIAGNOSIS — Z87891 Personal history of nicotine dependence: Secondary | ICD-10-CM | POA: Diagnosis not present

## 2022-10-01 DIAGNOSIS — Z8673 Personal history of transient ischemic attack (TIA), and cerebral infarction without residual deficits: Secondary | ICD-10-CM | POA: Diagnosis not present

## 2022-10-01 DIAGNOSIS — I10 Essential (primary) hypertension: Secondary | ICD-10-CM | POA: Diagnosis not present

## 2022-10-01 DIAGNOSIS — K449 Diaphragmatic hernia without obstruction or gangrene: Secondary | ICD-10-CM | POA: Diagnosis not present

## 2022-10-01 DIAGNOSIS — M25562 Pain in left knee: Secondary | ICD-10-CM | POA: Diagnosis not present

## 2022-10-14 NOTE — Progress Notes (Unsigned)
No chief complaint on file.   HISTORY OF PRESENT ILLNESS:  10/14/22 ALL:  Samuel Mendez is a 82 y.o. 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 He was seen in consult with Dr Pearlean Brownie 03/2022 and doing well. Since,   He continues asa 81mg  and atorvastatin 40mg  daily.    HISTORY (copied from Dr Marlis Edelson previous note)  HPI: Samuel Mendez is a 82 year old pleasant Caucasian male seen today for initial consultation visit for stroke.  History is obtained from the patient and his wife as well as review of electronic medical records.  I have reviewed pertinent available imaging films and PACS.Samuel Mendez is a 82 y.o. male with past medical history of hypertension, hyperlipidemia who presented to the emergency room with right-sided numbness.  Patient states he had a cardiac cath on 02/18/2022 at [facility] which got done at around 3 PM.  He got home by around 6 PM and noticed numbness on the right side of his face arm and leg.  States he initially thought this was effect of anesthesia.  However his symptoms were worse on Tuesday.  Eventually on Wednesday he called [facility] back and they told him to come to the emergency room.  CT head without contrast on 02/20/2022 showed minor white matter changes without acute abnormality.  MRI scan of the brain showed small area of acute infarct in the right cerebellum, left pontine and right occipital cortex.  MRI of the brain showed no large vessel stenosis or occlusion.  MRI of the neck also showed no large vessel stenosis.  Transthoracic echo showed ejection fraction of 65 to 70% without regional wall motion abnormalities.  LDL cholesterol 109 mg percent.  Hemoglobin A1c was 4.6.  Patient was started on dual antiplatelet therapy aspirin and Plavix for 3 weeks followed by.  Patient states he is doing much better.  He still has some numbness in his right leg and hip from time to time much better.  Currently.  He is tolerating aspirin  well without bruising or bleeding.  Blood pressure is well-controlled.  He is tolerating Lipitor well without muscle aches and pains.  He has no new complaints today.    REVIEW OF SYSTEMS: Out of a complete 14 system review of symptoms, the patient complains only of the following symptoms, and all other reviewed systems are negative.   ALLERGIES: No Known Allergies   HOME MEDICATIONS: Outpatient Medications Prior to Visit  Medication Sig Dispense Refill   acetaminophen (TYLENOL) 325 MG tablet Take 2 tablets (650 mg total) by mouth every 4 (four) hours as needed for mild pain (or temp > 37.5 C (99.5 F)). 30 tablet 0   Ascorbic Acid (VITAMIN C) 1000 MG tablet Take 1,000 mg by mouth daily.     aspirin EC 81 MG tablet Take 1 tablet (81 mg total) by mouth daily with breakfast. Swallow whole. 30 tablet 11   atorvastatin (LIPITOR) 40 MG tablet Take 1 tablet (40 mg total) by mouth daily. 30 tablet 3   benazepril (LOTENSIN) 40 MG tablet Take 40 mg by mouth daily.     citalopram (CELEXA) 10 MG tablet Take 1 tablet (10 mg total) by mouth daily. 90 tablet 3   clopidogrel (PLAVIX) 75 MG tablet Take 1 tablet (75 mg total) by mouth daily. Take Aspirin 81 mg daily along with Plavix 75 mg daily for 21 days then after that STOP the Plavix  and continue ONLY Aspirin 81 mg  daily indefinitely--for secondary stroke Prevention 30 tablet 0   hydrochlorothiazide (HYDRODIURIL) 25 MG tablet Take 25 mg by mouth daily.     metoprolol succinate (TOPROL-XL) 25 MG 24 hr tablet Take 0.5 tablets (12.5 mg total) by mouth daily. 45 tablet 2   Multiple Vitamins-Minerals (MULTIVITAMIN ADULT PO) Take by mouth.     nitroGLYCERIN (NITROSTAT) 0.3 MG SL tablet Place 0.3 mg under the tongue every 5 (five) minutes as needed for chest pain. (Patient not taking: Reported on 04/03/2022)     Omega-3 Fatty Acids (FISH OIL) 1000 MG CAPS Take 1 capsule by mouth daily.     OVER THE COUNTER MEDICATION Take 450 mg by mouth daily. Saw Palmetto      OVER THE COUNTER MEDICATION Take 1 tablet by mouth daily. Beets chews     zinc gluconate 50 MG tablet Take 50 mg by mouth as needed.     No facility-administered medications prior to visit.     PAST MEDICAL HISTORY: Past Medical History:  Diagnosis Date   Anemia, unspecified 09/11/2015   Anemia is due to giving platelets so frequently- we discussed slowing this down.     Anxiety and depression 04/30/2012   Comes and goes, never on medication    BPH (benign prostatic hyperplasia) 05/04/2014   Myrbetriq per Dr. Kriste Basque for overactive bladder symptoms then came off and did not recur Also saw palmetto. 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 5 year?   Hx of colonic polyps    Hypercholesteremia    Hypertension    MYALGIA 07/29/2007   Qualifier: Diagnosis of  By: Clent Ridges NP, Tammy     NECK PAIN 04/27/2010   Qualifier: Diagnosis of  By: Kriste Basque MD, Lonzo Cloud    Obesity    Osteoarthritis 04/22/2007   Cervical (turns whole body at times), hands in past. No rx.      Prostatitis    PROSTATITIS, HX OF 04/22/2007   Qualifier: Diagnosis of  By: Renaldo Fiddler CMA, Leigh     RENAL CALCULUS 04/22/2007   x3, [place] [facility] doctor released him   Sinusitis, acute 03/26/2012     PAST SURGICAL HISTORY: Past Surgical History:  Procedure Laterality Date   anal fistula repair     in 90s-outpatient repair   colles fracture left wrist after fall  11/2007   COLONOSCOPY     2013   SEPTOPLASTY N/A 05/08/2015   Procedure: NASAL SEPTOPLASTY;  Surgeon: Newman Pies, MD;  Location: [facility];   Service: ENT;  Laterality: N/A;   SINUS ENDO WITH FUSION Bilateral 05/08/2015   Procedure: ENDOSCOPIC BILATERAL  ETHMOIDECTOMY, BILATERAL MAXILLARY ANTROSTOMY, BILATERAL SPHENOIDECTOMY, BILATERAL  FRONTAL RECESS EXPLORATION WITH FUSION NAVIGATION;  Surgeon: Newman Pies, MD;  Location: [facility];  Service: ENT;  Laterality: Bilateral;     FAMILY HISTORY: Family History  Problem Relation Age of Onset   Hypertension Mother    Aneurysm Mother        brain died at 68   Stroke Mother  Pancreatic cancer Father    Colon cancer Neg Hx      SOCIAL HISTORY: Social History   Socioeconomic History   Marital status: Married    Spouse name: cora x 40 yrs   Number of children: 3   Years of education: Not on file   Highest education level: Not on file  Occupational History   Occupation: retired from truck driving in 0981   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 3 children, 2 from current wife. 4 grandkids. 1 greatgrandson 01/2019 - in [place]      Farms-raises cattle. Retired from truck driving.       Hobbies: TV, travel   Social Determinants of Corporate investment banker Strain: Not on file  Food Insecurity: Not on file  Transportation Needs: Not on file  Physical Activity: Not on file  Stress: Not on file  Social Connections: Not on file  Intimate Partner Violence: Not on file     PHYSICAL EXAM  There were no vitals filed for this visit. There is no height or weight on file to calculate BMI.  Generalized: Well  developed, in no acute distress  Cardiology: normal rate and rhythm, no murmur auscultated  Respiratory: clear to auscultation bilaterally    Neurological examination  Mentation: Alert oriented to time, place, history taking. Follows all commands speech and language fluent Cranial nerve II-XII: Pupils were equal round reactive to light. Extraocular movements were full, visual field were full on confrontational test. Facial sensation and strength were normal. Uvula tongue midline. Head turning and shoulder shrug  were normal and symmetric. Motor: The motor testing reveals 5 over 5 strength of all 4 extremities. Good symmetric motor tone is noted throughout.  Sensory: Sensory testing is intact to soft touch on all 4 extremities. No evidence of extinction is noted.  Coordination: Cerebellar testing reveals good finger-nose-finger and heel-to-shin bilaterally.  Gait and station: Gait is normal. Tandem gait is normal. Romberg is negative. No drift is seen.  Reflexes: Deep tendon reflexes are symmetric and normal bilaterally.    DIAGNOSTIC DATA (LABS, IMAGING, TESTING) - I reviewed patient records, labs, notes, testing and imaging myself where available.  Lab Results  Component Value Date   WBC 7.2 02/20/2022   HGB 13.2 02/20/2022   HCT 40.4 02/20/2022   MCV 91.8 02/20/2022   PLT 183 02/20/2022      Component Value Date/Time   NA 139 02/20/2022 1615   K 3.9 02/20/2022 1615   CL 104 02/20/2022 1615   CO2 29 02/20/2022 1615   GLUCOSE 96 02/20/2022 1615   BUN 16 02/20/2022 1615   CREATININE 1.17 02/20/2022 1615   CALCIUM 9.2 02/20/2022 1615   PROT 7.6 02/20/2022 1615   ALBUMIN 4.2 02/20/2022 1615   AST 18 02/20/2022 1615   ALT 17 02/20/2022 1615   ALKPHOS 52 02/20/2022 1615   BILITOT 0.8 02/20/2022 1615   GFRNONAA >60 02/20/2022 1615  GFRAA >60 05/04/2015 1022   Lab Results  Component Value Date   CHOL 116 02/21/2022   HDL 46 02/21/2022   LDLCALC 58 02/21/2022   LDLDIRECT  99.0 08/22/2014   TRIG 62 02/21/2022   CHOLHDL 2.5 02/21/2022   Lab Results  Component Value Date   HGBA1C 4.6 (L) 02/20/2022   No results found for: "VITAMINB12" Lab Results  Component Value Date   TSH 3.653 02/20/2022        No data to display               No data to display           ASSESSMENT AND PLAN  82 y.o. year old male  has a past medical history of Anemia, unspecified (09/11/2015), Anxiety and depression (04/30/2012), BPH (benign prostatic hyperplasia) (05/04/2014), Cerumen impaction (03/26/2012), COLONIC POLYPS (04/24/2007), Depression, major, single episode, moderate (HCC) (03/27/2017), Deviated septum, Diverticulosis of colon, Diverticulosis of large intestine (04/24/2007), DJD (degenerative joint disease), GAD (generalized anxiety disorder) (03/27/2017), History of adenomatous polyp of colon (06/13/2016), colonic polyps, Hypercholesteremia, Hypertension, MYALGIA (07/29/2007), NECK PAIN (04/27/2010), Obesity, Osteoarthritis (04/22/2007), Prostatitis, PROSTATITIS, HX OF (04/22/2007), RENAL CALCULUS (04/22/2007), and Sinusitis, acute (03/26/2012). here with    No diagnosis found.  Daksh Ricke Hey ***.  Healthy lifestyle habits encouraged. *** will follow up with PCP as directed. *** will return to see me in ***, sooner if needed. *** verbalizes understanding and agreement with this plan.   No orders of the defined types were placed in this encounter.    No orders of the defined types were placed in this encounter.    Shawnie Dapper, MSN, FNP-C 10/14/2022, 3:06 PM  [facility] [address], [address] [place], [place] [address] [phone]

## 2022-10-14 NOTE — Patient Instructions (Signed)
Below is our plan:  Goals:  1) Maintain strict control of hypertension with blood pressure goal below 130/90 2) Maintain good control of diabetes with hemoglobin A1c goal below 7%  3) Maintain good control of lipids with LDL cholesterol goal below 70 mg/dL.  4) Eat a healthy diet with plenty of whole grains, cereals, fruits and vegetables, exercise regularly and maintain ideal body weight   Resources: https://www.stroke.org/en/about-stroke/stroke-risk-factors/risk-factors-under-your-control  Please make sure you are staying well hydrated. I recommend 50-60 ounces daily. Well balanced diet and regular exercise encouraged. Consistent sleep schedule with 6-8 hours recommended.   Please continue follow up with care team as directed.   Follow up with *** in ***  You may receive a survey regarding today's visit. I encourage you to leave honest feed back as I do use this information to improve patient care. Thank you for seeing me today!    

## 2022-10-15 ENCOUNTER — Ambulatory Visit (INDEPENDENT_AMBULATORY_CARE_PROVIDER_SITE_OTHER): Payer: Non-veteran care | Admitting: Family Medicine

## 2022-10-15 ENCOUNTER — Encounter: Payer: Self-pay | Admitting: Family Medicine

## 2022-10-15 VITALS — BP 121/65 | HR 47 | Ht 72.0 in | Wt 226.5 lb

## 2022-10-15 DIAGNOSIS — I6310 Cerebral infarction due to embolism of unspecified precerebral artery: Secondary | ICD-10-CM

## 2022-10-28 ENCOUNTER — Telehealth: Payer: Self-pay | Admitting: *Deleted

## 2022-10-28 NOTE — Telephone Encounter (Signed)
Form in your inbox 

## 2022-10-28 NOTE — Telephone Encounter (Signed)
Signed.  And last ov to be faxed.  Sent to medical records.

## 2023-02-06 DIAGNOSIS — N182 Chronic kidney disease, stage 2 (mild): Secondary | ICD-10-CM | POA: Diagnosis not present

## 2023-02-06 DIAGNOSIS — Z809 Family history of malignant neoplasm, unspecified: Secondary | ICD-10-CM | POA: Diagnosis not present

## 2023-02-06 DIAGNOSIS — F411 Generalized anxiety disorder: Secondary | ICD-10-CM | POA: Diagnosis not present

## 2023-02-06 DIAGNOSIS — E785 Hyperlipidemia, unspecified: Secondary | ICD-10-CM | POA: Diagnosis not present

## 2023-02-06 DIAGNOSIS — I129 Hypertensive chronic kidney disease with stage 1 through stage 4 chronic kidney disease, or unspecified chronic kidney disease: Secondary | ICD-10-CM | POA: Diagnosis not present

## 2023-02-06 DIAGNOSIS — N400 Enlarged prostate without lower urinary tract symptoms: Secondary | ICD-10-CM | POA: Diagnosis not present

## 2023-02-06 DIAGNOSIS — F325 Major depressive disorder, single episode, in full remission: Secondary | ICD-10-CM | POA: Diagnosis not present

## 2023-02-06 DIAGNOSIS — Z87891 Personal history of nicotine dependence: Secondary | ICD-10-CM | POA: Diagnosis not present

## 2023-02-06 DIAGNOSIS — R32 Unspecified urinary incontinence: Secondary | ICD-10-CM | POA: Diagnosis not present

## 2023-02-06 DIAGNOSIS — N529 Male erectile dysfunction, unspecified: Secondary | ICD-10-CM | POA: Diagnosis not present

## 2023-02-06 DIAGNOSIS — Z85828 Personal history of other malignant neoplasm of skin: Secondary | ICD-10-CM | POA: Diagnosis not present

## 2023-02-06 DIAGNOSIS — Z008 Encounter for other general examination: Secondary | ICD-10-CM | POA: Diagnosis not present

## 2023-02-06 DIAGNOSIS — Z823 Family history of stroke: Secondary | ICD-10-CM | POA: Diagnosis not present

## 2023-02-09 DIAGNOSIS — R140 Abdominal distension (gaseous): Secondary | ICD-10-CM | POA: Diagnosis not present

## 2023-02-09 DIAGNOSIS — Z6841 Body Mass Index (BMI) 40.0 and over, adult: Secondary | ICD-10-CM | POA: Diagnosis not present

## 2023-02-09 DIAGNOSIS — R030 Elevated blood-pressure reading, without diagnosis of hypertension: Secondary | ICD-10-CM | POA: Diagnosis not present

## 2023-04-08 DIAGNOSIS — H5203 Hypermetropia, bilateral: Secondary | ICD-10-CM | POA: Diagnosis not present

## 2023-04-08 DIAGNOSIS — H43813 Vitreous degeneration, bilateral: Secondary | ICD-10-CM | POA: Diagnosis not present

## 2023-04-08 DIAGNOSIS — H2513 Age-related nuclear cataract, bilateral: Secondary | ICD-10-CM | POA: Diagnosis not present

## 2023-04-08 DIAGNOSIS — H52223 Regular astigmatism, bilateral: Secondary | ICD-10-CM | POA: Diagnosis not present

## 2023-04-08 DIAGNOSIS — H524 Presbyopia: Secondary | ICD-10-CM | POA: Diagnosis not present

## 2023-04-21 DIAGNOSIS — H524 Presbyopia: Secondary | ICD-10-CM | POA: Diagnosis not present

## 2023-04-21 DIAGNOSIS — H52223 Regular astigmatism, bilateral: Secondary | ICD-10-CM | POA: Diagnosis not present

## 2023-05-15 DIAGNOSIS — I10 Essential (primary) hypertension: Secondary | ICD-10-CM | POA: Diagnosis not present

## 2023-09-25 DIAGNOSIS — Z131 Encounter for screening for diabetes mellitus: Secondary | ICD-10-CM | POA: Diagnosis not present

## 2023-09-25 DIAGNOSIS — N400 Enlarged prostate without lower urinary tract symptoms: Secondary | ICD-10-CM | POA: Diagnosis not present

## 2023-09-25 DIAGNOSIS — I10 Essential (primary) hypertension: Secondary | ICD-10-CM | POA: Diagnosis not present

## 2023-10-15 ENCOUNTER — Ambulatory Visit: Payer: Non-veteran care | Admitting: Family Medicine

## 2023-10-23 DIAGNOSIS — D6959 Other secondary thrombocytopenia: Secondary | ICD-10-CM | POA: Diagnosis not present

## 2023-10-23 DIAGNOSIS — N400 Enlarged prostate without lower urinary tract symptoms: Secondary | ICD-10-CM | POA: Diagnosis not present

## 2023-10-23 DIAGNOSIS — D649 Anemia, unspecified: Secondary | ICD-10-CM | POA: Diagnosis not present

## 2023-10-23 DIAGNOSIS — Z0000 Encounter for general adult medical examination without abnormal findings: Secondary | ICD-10-CM | POA: Diagnosis not present

## 2023-10-23 DIAGNOSIS — Z8673 Personal history of transient ischemic attack (TIA), and cerebral infarction without residual deficits: Secondary | ICD-10-CM | POA: Diagnosis not present

## 2023-10-23 DIAGNOSIS — K449 Diaphragmatic hernia without obstruction or gangrene: Secondary | ICD-10-CM | POA: Diagnosis not present

## 2023-10-23 DIAGNOSIS — I2510 Atherosclerotic heart disease of native coronary artery without angina pectoris: Secondary | ICD-10-CM | POA: Diagnosis not present

## 2023-10-23 DIAGNOSIS — N289 Disorder of kidney and ureter, unspecified: Secondary | ICD-10-CM | POA: Diagnosis not present

## 2023-10-23 DIAGNOSIS — I10 Essential (primary) hypertension: Secondary | ICD-10-CM | POA: Diagnosis not present

## 2023-10-23 DIAGNOSIS — E785 Hyperlipidemia, unspecified: Secondary | ICD-10-CM | POA: Diagnosis not present

## 2023-10-23 DIAGNOSIS — F411 Generalized anxiety disorder: Secondary | ICD-10-CM | POA: Diagnosis not present

## 2023-10-23 DIAGNOSIS — M1712 Unilateral primary osteoarthritis, left knee: Secondary | ICD-10-CM | POA: Diagnosis not present

## 2023-12-03 DIAGNOSIS — D6959 Other secondary thrombocytopenia: Secondary | ICD-10-CM | POA: Diagnosis not present

## 2023-12-03 DIAGNOSIS — Z6830 Body mass index (BMI) 30.0-30.9, adult: Secondary | ICD-10-CM | POA: Diagnosis not present

## 2023-12-03 DIAGNOSIS — D649 Anemia, unspecified: Secondary | ICD-10-CM | POA: Diagnosis not present

## 2023-12-03 DIAGNOSIS — Z713 Dietary counseling and surveillance: Secondary | ICD-10-CM | POA: Diagnosis not present

## 2023-12-03 DIAGNOSIS — Z79899 Other long term (current) drug therapy: Secondary | ICD-10-CM | POA: Diagnosis not present

## 2023-12-03 DIAGNOSIS — N289 Disorder of kidney and ureter, unspecified: Secondary | ICD-10-CM | POA: Diagnosis not present

## 2023-12-03 DIAGNOSIS — I25119 Atherosclerotic heart disease of native coronary artery with unspecified angina pectoris: Secondary | ICD-10-CM | POA: Diagnosis not present

## 2023-12-03 DIAGNOSIS — F17210 Nicotine dependence, cigarettes, uncomplicated: Secondary | ICD-10-CM | POA: Diagnosis not present

## 2023-12-03 DIAGNOSIS — N400 Enlarged prostate without lower urinary tract symptoms: Secondary | ICD-10-CM | POA: Diagnosis not present

## 2023-12-03 DIAGNOSIS — F411 Generalized anxiety disorder: Secondary | ICD-10-CM | POA: Diagnosis not present

## 2023-12-03 DIAGNOSIS — I10 Essential (primary) hypertension: Secondary | ICD-10-CM | POA: Diagnosis not present

## 2023-12-03 DIAGNOSIS — Z8673 Personal history of transient ischemic attack (TIA), and cerebral infarction without residual deficits: Secondary | ICD-10-CM | POA: Diagnosis not present

## 2024-01-24 ENCOUNTER — Ambulatory Visit
Admission: EM | Admit: 2024-01-24 | Discharge: 2024-01-24 | Disposition: A | Discharge status: Home / Self Care | Attending: Family Medicine | Admitting: Family Medicine

## 2024-01-24 ENCOUNTER — Other Ambulatory Visit

## 2024-01-24 ENCOUNTER — Ambulatory Visit

## 2024-01-24 DIAGNOSIS — M898X1 Other specified disorders of bone, shoulder: Secondary | ICD-10-CM

## 2024-01-24 NOTE — Discharge Instructions (Signed)
 There is an area suspicious for a clavicle fracture where the clavicle meets into the shoulder.  We have placed you in a shoulder immobilizer today and recommend that you call orthopedics first thing Monday morning for follow-up and further management instructions.  You may apply ice to the area of pain off-and-on, take over-the-counter pain relievers as needed.  Return for significantly worsening symptoms at any time

## 2024-01-24 NOTE — ED Provider Notes (Signed)
 [facility]    CSN: [id number] Arrival date & time: 01/24/24  1052      History   Chief Complaint No chief complaint on file.   HPI Samuel Mendez is a 83 y.o. male.   Patient presenting today with new onset right shoulder pain after falling out of the bed earlier today.  He states he landed on the shoulder and also scraped his left cheek.  Denies loss of consciousness, numbness, tingling, weakness, skin injury, nausea, vomiting, dizziness, mental status changes.  He has decreased range of motion in the right arm due to pain.  States he took 2 pain pills at home prior to arrival which did help.  He denies any other areas of pain from the fall.  He states it was a mechanical fall.     Past Medical History:  Diagnosis Date   Anemia, unspecified 09/11/2015   Anemia is due to giving platelets so frequently- we discussed slowing this down.     Anxiety and depression 04/30/2012   Comes and goes, never on medication    BPH (benign prostatic hyperplasia) 05/04/2014   Myrbetriq  per Dr. Christi for overactive bladder symptoms then came off and did not recur Also saw palmetto. 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 5 year?   Hx of colonic polyps    Hypercholesteremia    Hypertension    MYALGIA 07/29/2007   Qualifier: Diagnosis of  By: Orlie NP, Tammy     NECK PAIN 04/27/2010   Qualifier: Diagnosis of  By: Christi MD, Glendia HERO    Obesity    Osteoarthritis 04/22/2007   Cervical (turns whole body at times), hands in past. No rx.      Prostatitis     PROSTATITIS, HX OF 04/22/2007   Qualifier: Diagnosis of  By: Latisha CMA, Leigh     RENAL CALCULUS 04/22/2007   x3, [place] doctor released him   Sinusitis, acute 03/26/2012    Patient Active Problem List   Diagnosis Date Noted   Acute CVA (cerebrovascular accident) (HCC) 02/20/2022   Sinus bradycardia 02/20/2022   Typical angina 02/20/2022   GERD (gastroesophageal reflux disease) 04/01/2019   Allergic rhinitis 08/15/2017   Depression, major, single episode, moderate (HCC) 03/27/2017   GAD (generalized anxiety disorder) 03/27/2017   History of adenomatous polyp of colon 06/13/2016   Anemia, unspecified 09/11/2015   BPH (benign prostatic hyperplasia) 05/04/2014   Obesity 05/01/2011   COLONIC POLYPS 04/24/2007   HYPERCHOLESTEROLEMIA 04/24/2007   Essential hypertension 04/22/2007   Osteoarthritis 04/22/2007    Past Surgical History:  Procedure Laterality Date   anal fistula repair     in 90s-outpatient repair   colles fracture left wrist after fall  11/2007   COLONOSCOPY     2013   SEPTOPLASTY N/A 05/08/2015   Procedure: NASAL SEPTOPLASTY;  Surgeon: Daniel Moccasin, MD;  Location: [facility];  Service: ENT;  Laterality: N/A;   SINUS ENDO WITH FUSION Bilateral 05/08/2015   Procedure: ENDOSCOPIC BILATERAL  ETHMOIDECTOMY, BILATERAL  MAXILLARY ANTROSTOMY, BILATERAL SPHENOIDECTOMY, BILATERAL  FRONTAL RECESS EXPLORATION WITH FUSION NAVIGATION;  Surgeon: Daniel Moccasin, MD;  Location: [facility];  Service: ENT;  Laterality: Bilateral;       Home Medications    Prior to Admission medications   Medication Sig Start Date End Date Taking? Authorizing Provider  aspirin  EC 81 MG tablet Take 1 tablet (81 mg total) by mouth daily with breakfast. Swallow whole. 02/21/22   Pearlean Manus, MD  atorvastatin  (LIPITOR) 40 MG tablet Take 1 tablet (40 mg total) by mouth daily. 02/22/22   Pearlean Manus, MD  benazepril  (LOTENSIN ) 40 MG tablet Take 40 mg by mouth daily.    [provider]  citalopram  (CELEXA ) 10 MG tablet Take 1 tablet (10 mg total) by mouth daily. 04/01/19   Katrinka Garnette KIDD, MD  hydrochlorothiazide  (HYDRODIURIL ) 25 MG tablet Take 25 mg by mouth daily.    [provider]  metoprolol  succinate (TOPROL -XL) 25 MG 24 hr tablet Take 0.5 tablets (12.5 mg total) by mouth daily. 02/21/22   Pearlean Manus, MD  Multiple Vitamins-Minerals (MULTIVITAMIN ADULT PO) Take by mouth.    [provider]  nitroGLYCERIN  (NITROSTAT ) 0.3 MG SL tablet Place 0.3 mg under the tongue every 5 (five) minutes as needed for chest pain. Patient not taking: Reported on 04/03/2022 02/11/22   [provider]  OVER THE COUNTER MEDICATION Take 1 tablet by mouth daily. Beets chews    [provider]    Family History Family History  Problem Relation Age of Onset   Hypertension Mother    Aneurysm Mother        brain died at 24   Stroke Mother    Pancreatic cancer Father    Colon cancer Neg Hx     Social History Social History   Tobacco Use   Smoking status: Former    Types: Cigars    Quit date: 05/06/2017    Years since quitting: 6.7   Smokeless tobacco: Never   Tobacco comments:    1-2 per week of cigars only after retirement  Substance Use Topics   Alcohol use: No    Alcohol/week: 0.0 standard drinks of alcohol   Drug use: No     Allergies   Patient has no known allergies.   Review of Systems Review of Systems Per HPI  Physical Exam Triage Vital Signs ED Triage Vitals  Encounter Vitals Group     BP 01/24/24 1110 138/62     Girls Systolic BP Percentile --      Girls Diastolic BP Percentile --      Boys Systolic BP Percentile --      Boys Diastolic BP Percentile --      Pulse Rate 01/24/24 1110 (!) 49     Resp 01/24/24 1110 20     Temp 01/24/24 1110 98 F (36.7 C)     Temp Source 01/24/24 1110 Oral     SpO2 01/24/24 1110 94 %     Weight --      Height --      Head Circumference --      Peak Flow --       Pain Score 01/24/24 1113 10     Pain Loc --      Pain Education --      Exclude from Growth Chart --    No data found.  Updated Vital Signs BP 138/62 (BP Location: Right Arm)   Pulse (!) 49   Temp 98  F (36.7 C) (Oral)   Resp 20   SpO2 94%   Visual Acuity Right Eye Distance:   Left Eye Distance:   Bilateral Distance:    Right Eye Near:   Left Eye Near:    Bilateral Near:     Physical Exam Vitals and nursing note reviewed.  Constitutional:      Appearance: Normal appearance.  HENT:     Head: Atraumatic.  Eyes:     Extraocular Movements: Extraocular movements intact.     Conjunctiva/sclera: Conjunctivae normal.  Cardiovascular:     Rate and Rhythm: Normal rate.  Pulmonary:     Effort: Pulmonary effort is normal.     Breath sounds: Normal breath sounds.  Musculoskeletal:        General: Swelling, tenderness and signs of injury present. No deformity. Normal range of motion.     Cervical back: Normal range of motion and neck supple.     Comments: Localized area of tenderness to palpation over the right distal clavicle.  Decreased range of motion to the right shoulder due to pain  Skin:    General: Skin is warm and dry.     Findings: No bruising or erythema.  Neurological:     General: No focal deficit present.     Mental Status: He is oriented to person, place, and time.     Motor: No weakness.     Comments: Right upper extremity neurovascularly intact  Psychiatric:        Mood and Affect: Mood normal.        Thought Content: Thought content normal.        Judgment: Judgment normal.    UC Treatments / Results  Labs (all labs ordered are listed, but only abnormal results are displayed) Labs Reviewed - No data to display  EKG   Radiology DG Shoulder Right Result Date: 01/24/2024 CLINICAL DATA:  fell out of bed landing on the right shoulder EXAM: RIGHT SHOULDER - 2+ VIEW COMPARISON:  None Available. FINDINGS: There is no evidence of definite acute displaced  fracture or dislocation. Cortical irregularity of the distal clavicle- limited evaluation due to overlapping osseous structures and overlying soft tissues. Acromioclavicular joint degenerative changes. No aggressive appearing focal bone abnormality. Soft tissues are unremarkable. IMPRESSION: Cortical irregularity of the distal clavicle- limited evaluation due to overlapping osseous structures and overlying soft tissues. Consider dedicated right clavicular radiographs for further evaluation. Underlying fracture not excluded. Electronically Signed   By: Morgane  Naveau M.D.   On: 01/24/2024 11:47    Procedures Procedures (including critical care time)  Medications Ordered in UC Medications - No data to display  Initial Impression / Assessment and Plan / UC Course  I have reviewed the triage vital signs and the nursing notes.  Pertinent labs & imaging results that were available during my care of the patient were reviewed by me and considered in my medical decision making (see chart for details).     X-ray of the right shoulder today showing possible evidence of a distal right clavicle fracture.  He was placed in a shoulder immobilizer and orthopedic resources given for immediate follow-up Monday morning for further evaluation and management.  Discussed ice off-and-on, over-the-counter pain relievers, supportive home care. Final Clinical Impressions(s) / UC Diagnoses   Final diagnoses:  Pain of right clavicle     Discharge Instructions      There is an area suspicious for a clavicle fracture where the clavicle meets into the shoulder.  We have  placed you in a shoulder immobilizer today and recommend that you call orthopedics first thing Monday morning for follow-up and further management instructions.  You may apply ice to the area of pain off-and-on, take over-the-counter pain relievers as needed.  Return for significantly worsening symptoms at any time    ED Prescriptions   None     PDMP not reviewed this encounter.   Stuart Vernell Norris, [place] 01/24/24 1504

## 2024-01-24 NOTE — ED Triage Notes (Signed)
 Pt reports fall that resulted in right shoulder injury. Pt states he fell out the bed. And landed on the shoulder.

## 2024-01-30 ENCOUNTER — Other Ambulatory Visit: Payer: Self-pay

## 2024-01-30 ENCOUNTER — Emergency Department (HOSPITAL_COMMUNITY)

## 2024-01-30 ENCOUNTER — Observation Stay (HOSPITAL_COMMUNITY)
Admission: EM | Admit: 2024-01-30 | Discharge: 2024-02-02 | Disposition: A | Discharge status: Home / Self Care | Attending: Family Medicine | Admitting: Family Medicine

## 2024-01-30 DIAGNOSIS — Z7982 Long term (current) use of aspirin: Secondary | ICD-10-CM | POA: Insufficient documentation

## 2024-01-30 DIAGNOSIS — E782 Mixed hyperlipidemia: Secondary | ICD-10-CM | POA: Diagnosis not present

## 2024-01-30 DIAGNOSIS — I2510 Atherosclerotic heart disease of native coronary artery without angina pectoris: Secondary | ICD-10-CM | POA: Diagnosis present

## 2024-01-30 DIAGNOSIS — F32A Depression, unspecified: Secondary | ICD-10-CM | POA: Diagnosis present

## 2024-01-30 DIAGNOSIS — Z87891 Personal history of nicotine dependence: Secondary | ICD-10-CM | POA: Diagnosis not present

## 2024-01-30 DIAGNOSIS — R079 Chest pain, unspecified: Secondary | ICD-10-CM | POA: Diagnosis present

## 2024-01-30 DIAGNOSIS — Z79899 Other long term (current) drug therapy: Secondary | ICD-10-CM | POA: Diagnosis not present

## 2024-01-30 DIAGNOSIS — I480 Paroxysmal atrial fibrillation: Principal | ICD-10-CM | POA: Diagnosis present

## 2024-01-30 DIAGNOSIS — F325 Major depressive disorder, single episode, in full remission: Secondary | ICD-10-CM

## 2024-01-30 DIAGNOSIS — I4891 Unspecified atrial fibrillation: Principal | ICD-10-CM | POA: Diagnosis present

## 2024-01-30 DIAGNOSIS — I10 Essential (primary) hypertension: Secondary | ICD-10-CM | POA: Diagnosis not present

## 2024-01-30 LAB — CBC
HCT: 37.5 % — ABNORMAL LOW (ref 39.0–52.0)
Hemoglobin: 12.2 g/dL — ABNORMAL LOW (ref 13.0–17.0)
MCH: 29.5 pg (ref 26.0–34.0)
MCHC: 32.5 g/dL (ref 30.0–36.0)
MCV: 90.8 fL (ref 80.0–100.0)
Platelets: 155 K/uL (ref 150–400)
RBC: 4.13 MIL/uL — ABNORMAL LOW (ref 4.22–5.81)
RDW: 12.5 % (ref 11.5–15.5)
WBC: 7.9 K/uL (ref 4.0–10.5)
nRBC: 0 % (ref 0.0–0.2)

## 2024-01-30 LAB — BASIC METABOLIC PANEL WITH GFR
Anion gap: 18 — ABNORMAL HIGH (ref 5–15)
BUN: 20 mg/dL (ref 8–23)
CO2: 25 mmol/L (ref 22–32)
Calcium: 10.1 mg/dL (ref 8.9–10.3)
Chloride: 102 mmol/L (ref 98–111)
Creatinine, Ser: 1.39 mg/dL — ABNORMAL HIGH (ref 0.61–1.24)
GFR, Estimated: 51 mL/min — ABNORMAL LOW (ref >60)
Glucose, Bld: 154 mg/dL — ABNORMAL HIGH (ref 70–99)
Potassium: 3.6 mmol/L (ref 3.5–5.1)
Sodium: 145 mmol/L (ref 135–145)

## 2024-01-30 LAB — D-DIMER, QUANTITATIVE: D-Dimer, Quant: 1.26 ug{FEU}/mL — ABNORMAL HIGH (ref 0.00–0.50)

## 2024-01-30 LAB — TROPONIN T, HIGH SENSITIVITY
Troponin T High Sensitivity: 16 ng/L (ref 0–19)
Troponin T High Sensitivity: 41 ng/L — ABNORMAL HIGH (ref 0–19)

## 2024-01-30 MED ORDER — DILTIAZEM LOAD VIA INFUSION
10.0000 mg | Freq: Once | INTRAVENOUS | Status: AC
  Administered 2024-01-30: 10 mg via INTRAVENOUS
  Filled 2024-01-30: qty 10

## 2024-01-30 MED ORDER — ENOXAPARIN SODIUM 100 MG/ML IJ SOSY
100.0000 mg | PREFILLED_SYRINGE | Freq: Once | INTRAMUSCULAR | Status: AC
  Administered 2024-01-30: 100 mg via SUBCUTANEOUS
  Filled 2024-01-30: qty 1

## 2024-01-30 MED ORDER — ASPIRIN 81 MG PO CHEW
324.0000 mg | CHEWABLE_TABLET | Freq: Once | ORAL | Status: AC
  Administered 2024-01-30: 324 mg via ORAL
  Filled 2024-01-30: qty 4

## 2024-01-30 MED ORDER — DILTIAZEM HCL-DEXTROSE 125-5 MG/125ML-% IV SOLN (PREMIX)
5.0000 mg/h | INTRAVENOUS | Status: AC
  Administered 2024-01-30: 5 mg/h via INTRAVENOUS
  Filled 2024-01-30: qty 125

## 2024-01-30 MED ORDER — FENTANYL CITRATE (PF) 100 MCG/2ML IJ SOLN
50.0000 ug | Freq: Once | INTRAMUSCULAR | Status: AC
  Administered 2024-01-30: 50 ug via INTRAVENOUS
  Filled 2024-01-30: qty 2

## 2024-01-30 MED ORDER — IOHEXOL 350 MG/ML SOLN
75.0000 mL | Freq: Once | INTRAVENOUS | Status: AC | PRN
Start: 2024-01-30 — End: 2024-01-30
  Administered 2024-01-30: 75 mL via INTRAVENOUS

## 2024-01-30 MED ORDER — ENOXAPARIN SODIUM 100 MG/ML IJ SOSY: 1.0000 mg/kg | PREFILLED_SYRINGE | Freq: Once | INTRAMUSCULAR | Status: DC

## 2024-01-30 NOTE — ED Provider Notes (Signed)
 Patient's care assumed at 7 PM.  Patient is pending CT scan of his chest.  Patient came to the emergency department with chest discomfort.  Patient is in atrial fibrillation with RVR.  Patient has been started on a diltiazem drip and heart rate has improved to the 90s.  Patient reports he is feeling better.  CT scan shows no evidence of pulmonary embolus.  Hospitalist is consulted for admission   Flint Sonny MARLA DEVONNA 01/30/24 2146    Francesca Elsie CROME, MD 02/02/24 [phone]

## 2024-01-30 NOTE — ED Provider Notes (Signed)
 [place] EMERGENCY DEPARTMENT AT [facility] Provider Note   CSN: [id number] Arrival date & time: 01/30/24  1603     Patient presents with: Chest Pain   Samuel Mendez is a 83 y.o. male.  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  Presents to the ER today complaining of chest pain that started about an hour prior to arrival he states initially was rating to both shoulders, he took 2 nitroglycerin , he states the pain is now only the left side of chest not on his shoulders or arms but he still having some mild pain.  He states he is no longer having dizziness or sweating which he was having at the time.  He reports he has been having some indigestion for the past couple of days but that chest pain today feels different than that.  He states he was having shortness of breath initially but this is now resolved as well.  Denies any leg pain or swelling.    Chest Pain      Prior to Admission medications   Medication Sig Start Date End Date Taking? Authorizing Provider  aspirin  EC 81 MG tablet Take 1 tablet (81 mg total) by mouth daily with breakfast. Swallow whole. 02/21/22   Pearlean Manus, MD  atorvastatin  (LIPITOR) 40 MG tablet Take 1 tablet (40 mg total) by mouth daily. 02/22/22   Pearlean Manus, MD  benazepril  (LOTENSIN ) 40 MG tablet Take 40 mg by mouth daily.    [provider]  citalopram  (CELEXA ) 40 MG tablet Take 40 mg by mouth daily.    [provider]  hydrochlorothiazide  (HYDRODIURIL ) 25 MG tablet Take 25 mg by mouth daily.    [provider]  hydrochlorothiazide  (HYDRODIURIL ) 50 MG tablet Take 50 mg by mouth every morning.    [provider]  metoprolol  succinate (TOPROL -XL) 25 MG 24 hr tablet Take 0.5 tablets (12.5 mg total) by mouth daily. 02/21/22   Pearlean Manus, MD  Multiple Vitamins-Minerals (MULTIVITAMIN ADULT PO) Take by mouth.    [provider]  nitroGLYCERIN  (NITROSTAT ) 0.4 MG  SL tablet Place 0.4 mg under the tongue every 5 (five) minutes as needed for chest pain.    [provider]  OVER THE COUNTER MEDICATION Take 1 tablet by mouth daily. Beets chews    [provider]  tamsulosin (FLOMAX) 0.4 MG CAPS capsule Take 0.4 mg by mouth daily. 11/05/23   [provider]    Allergies: Patient has no known allergies.    Review of Systems  Cardiovascular:  Positive for chest pain.    Updated Vital Signs BP 109/72 (BP Location: Left Arm)   Pulse (!) 115   Resp 19   Ht 6' (1.829 m)   Wt 99.8 kg   SpO2 98%   BMI 29.84 kg/m   Physical Exam Vitals and nursing note reviewed.  Constitutional:      General: He is not in acute distress.    Appearance: He is well-developed.  HENT:     Head: Normocephalic and atraumatic.  Eyes:     Conjunctiva/sclera: Conjunctivae normal.  Cardiovascular:     Rate and Rhythm: Tachycardia present. Rhythm irregular.     Heart sounds: No murmur heard. Pulmonary:     Effort: Pulmonary effort is normal. No respiratory distress.     Breath sounds: Normal breath sounds.  Abdominal:     Palpations: Abdomen is soft.     Tenderness: There is no abdominal tenderness.  Musculoskeletal:        General: No swelling.     Cervical back: Neck supple.     Right lower leg: No tenderness. No edema.     Left lower leg: No edema.  Skin:    General: Skin is warm and dry.     Capillary Refill: Capillary refill takes less than 2 seconds.  Neurological:     Mental Status: He is alert.  Psychiatric:        Mood and Affect: Mood normal.     (all labs ordered are listed, but only abnormal results are displayed) Labs Reviewed  BASIC METABOLIC PANEL WITH GFR - Abnormal; Notable for the following components:      Result Value   Glucose, Bld 154 (*)    Creatinine, Ser 1.39 (*)    GFR, Estimated 51 (*)    Anion gap 18 (*)    All other components within normal limits  CBC - Abnormal; Notable for the following components:    RBC 4.13 (*)    Hemoglobin 12.2 (*)    HCT 37.5 (*)    All other components within normal limits  D-DIMER, QUANTITATIVE  TROPONIN T, HIGH SENSITIVITY  TROPONIN T, HIGH SENSITIVITY    EKG: EKG Interpretation Date/Time:  Friday January 30 2024 16:15:02 EDT Ventricular Rate:  125 PR Interval:    QRS Duration:  88 QT Interval:  306 QTC Calculation: 441 R Axis:   16  Text Interpretation: Atrial fibrillation with rapid ventricular response Minimal voltage criteria for LVH, may be normal variant ( R in aVL ) Posterior infarct , age undetermined Marked ST abnormality, possible inferolateral subendocardial injury likely rate related Abnormal ECG Confirmed by Francesca Fallow ([id number]) on 01/30/2024 5:25:21 PM  Radiology: [facility] Chest Port 1 View Result Date: 01/30/2024 EXAM: 1 VIEW(S) XRAY OF THE CHEST 01/30/2024 04:34:00 PM COMPARISON: 09/12/2020 CLINICAL HISTORY: Chest pain. FINDINGS: LUNGS AND PLEURA: No focal pulmonary opacity. No pulmonary edema. No pleural effusion. No pneumothorax. HEART AND MEDIASTINUM: No acute abnormality of the cardiac and mediastinal silhouettes. BONES AND SOFT TISSUES: No acute osseous abnormality. IMPRESSION: 1. No acute cardiopulmonary disease. Electronically signed by: Lynwood Seip MD 01/30/2024 04:41 PM EDT RP Workstation: [id number]     Procedures   Medications Ordered in the ED  fentaNYL  (SUBLIMAZE ) injection 50 mcg (has no administration in time range)                                    Medical Decision Making .This patient presents to the ED for concern of chest pain, this involves an extensive number of treatment options, and is a complaint that carries with it a high risk of complications and morbidity.  The differential diagnosis includes CS, PE, pneumonia, pneumothorax, pericarditis, other   Co morbidities that complicate the patient evaluation :   History of CAD-nonobstructive disease on cath in 2023   Additional history  obtained:  Additional history obtained from EMR External records from outside source obtained and reviewed including prior notes and lab   Lab Tests:  I Ordered, and personally interpreted labs.  The pertinent results include: CBC normal troponin 16 initially, BMP minimal increase in creatinine  Imaging Studies ordered:  I ordered imaging studies including Xray chest which shows no pulmonary edema or infiltrate I independently visualized and interpreted imaging within scope of identifying emergent findings  I agree with the radiologist interpretation   Cardiac Monitoring: /  EKG:  The patient was maintained on a cardiac monitor.  I personally viewed and interpreted the cardiac monitored which showed an underlying rhythm of: afib with RVR, ST depression Inferolaterally     Problem List / ED Course / Critical interventions / Medication management Patient here for chest pain that started an hour prior to arrival, he was in A-fib with RVR which is new for him, he is not on anticoagulants, he has been having some indigestion for the past couple of days but the chest pain started today.  It has improved since being in the ER he still having some mild pains he was given aspirin , fentanyl  and his pain has improved but heart rate was still in the 120s so patient was given diltiazem bolus and put on a drip.  Since this is new for him and he was having chest pain and some shortness of breath I ordered a D-dimer which is elevated so we will order PE.  Patient will require admission and he is agreeable with this, send CT is still pending and second troponin is also still pending, signed out at shift change to Darice Showers PA-C   Amount and/or Complexity of Data Reviewed Labs: ordered. Radiology: ordered.  Risk OTC drugs. Prescription drug management.        Final diagnoses:  None    ED Discharge Orders     None          Suellen Sherran DELENA DEVONNA 01/30/24 1936    Francesca Elsie CROME, MD 02/02/24 [phone]

## 2024-01-30 NOTE — Discharge Instructions (Addendum)
 IMPORTANT INFORMATION: PAY CLOSE ATTENTION   PHYSICIAN DISCHARGE INSTRUCTIONS  Follow with Primary care provider  Benita Stabile, MD  and other consultants as instructed by your Hospitalist Physician  SEEK MEDICAL CARE OR RETURN TO EMERGENCY ROOM IF SYMPTOMS COME BACK, WORSEN OR NEW PROBLEM DEVELOPS   Please note: You were cared for by a hospitalist during your hospital stay. Every effort will be made to forward records to your primary care provider.  You can request that your primary care provider send for your hospital records if they have not received them.  Once you are discharged, your primary care physician will handle any further medical issues. Please note that NO REFILLS for any discharge medications will be authorized once you are discharged, as it is imperative that you return to your primary care physician (or establish a relationship with a primary care physician if you do not have one) for your post hospital discharge needs so that they can reassess your need for medications and monitor your lab values.  Please get a complete blood count and chemistry panel checked by your Primary MD at your next visit, and again as instructed by your Primary MD.  Get Medicines reviewed and adjusted: Please take all your medications with you for your next visit with your Primary MD  Laboratory/radiological data: Please request your Primary MD to go over all hospital tests and procedure/radiological results at the follow up, please ask your primary care provider to get all Hospital records sent to his/her office.  In some cases, they will be blood work, cultures and biopsy results pending at the time of your discharge. Please request that your primary care provider follow up on these results.  If you are diabetic, please bring your blood sugar readings with you to your follow up appointment with primary care.    Please call and make your follow up appointments as soon as possible.    Also Note the  following: If you experience worsening of your admission symptoms, develop shortness of breath, life threatening emergency, suicidal or homicidal thoughts you must seek medical attention immediately by calling 911 or calling your MD immediately  if symptoms less severe.  You must read complete instructions/literature along with all the possible adverse reactions/side effects for all the Medicines you take and that have been prescribed to you. Take any new Medicines after you have completely understood and accpet all the possible adverse reactions/side effects.   Do not drive when taking Pain medications or sleeping medications (Benzodiazepines)  Do not take more than prescribed Pain, Sleep and Anxiety Medications. It is not advisable to combine anxiety,sleep and pain medications without talking with your primary care practitioner  Special Instructions: If you have smoked or chewed Tobacco  in the last 2 yrs please stop smoking, stop any regular Alcohol  and or any Recreational drug use.  Wear Seat belts while driving.  Do not drive if taking any narcotic, mind altering or controlled substances or recreational drugs or alcohol.

## 2024-01-30 NOTE — ED Triage Notes (Signed)
 Pt comes in for left CP. Pt has a hx of cardiac issues. Pt has n/v/d. CP started an hr ago. A&Ox4.   Pt took 2 nitro tabs before coming in but they did not help. Denies any trauma to the area or lifting anything heavy recently.

## 2024-01-30 NOTE — H&P (Signed)
 History and Physical    Patient: Samuel Mendez FMW:[id number] DOB: [birth year] DOA: 01/30/2024 DOS: the patient was seen and examined on 01/31/2024 PCP: Shona Norleen PEDLAR, MD  Patient coming from: Home  Chief Complaint:  Chief Complaint  Patient presents with   Chest Pain   HPI: Samuel Mendez is a 83 y.o. male with medical history significant of hypertension, hyperlipidemia, CAD, depression who presents to the emergency department due to chest pain which occurred about 1 hour prior to arrival to the ED.  Chest pain was midsternal to midclavicular line of the left side of chest and was achy in nature per patient, he took 2 nitroglycerin  tablets without any improvement.  This was associated with some shortness of breath, sweating, dizziness and complained of 2 loose bowel movement during the episode.  ED Course:  In the emergency department, he was tachycardic, but other vital signs were within normal range.  Workup in the ED showed normocytic anemia.  BMP was normal except for blood glucose of 154, creatinine 1.39 (baseline creatinine at 1.2).  Troponin 16 >> 41, D-dimer 1.26. CT angiography chest with contrast showed no evidence of pulmonary embolus.  Showed no acute cardiopulmonary disease. Patient was treated with aspirin  324 mg x 1, IV Cardizem 10 mg x 1 was given and he was started on IV Cardizem drip.  Therapeutic Lovenox  x 1 was given, fentanyl  was given due to pain. Chest pain resolved at bedside.  Review of Systems: Review of systems as noted in the HPI. All other systems reviewed and are negative.   Past Medical History:  Diagnosis Date   Anemia, unspecified 09/11/2015   Anemia is due to giving platelets so frequently- we discussed slowing this down.     Anxiety and depression 04/30/2012   Comes and goes, never on medication    BPH (benign prostatic hyperplasia) 05/04/2014   Myrbetriq  per Dr. Christi for overactive bladder symptoms then came off and did not recur Also saw  palmetto. 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 5 year?   Hx of colonic polyps    Hypercholesteremia    Hypertension    MYALGIA 07/29/2007   Qualifier: Diagnosis of  By: Orlie NP, Tammy     NECK PAIN 04/27/2010   Qualifier: Diagnosis of  By: Christi MD, Glendia HERO    Obesity    Osteoarthritis 04/22/2007   Cervical (turns whole body at times), hands in past. No rx.      Prostatitis    PROSTATITIS, HX OF 04/22/2007   Qualifier: Diagnosis of  By: Latisha CMA, Leigh     RENAL CALCULUS 04/22/2007   x3, tinnie doctor released him   Sinusitis, acute 03/26/2012   Past Surgical History:  Procedure Laterality Date   anal fistula repair     in 90s-outpatient repair   colles fracture left wrist after fall  11/2007   COLONOSCOPY     2013   SEPTOPLASTY N/A 05/08/2015   Procedure: NASAL SEPTOPLASTY;  Surgeon: Daniel Moccasin, MD;  Location:  [facility];  Service: ENT;  Laterality: N/A;   SINUS ENDO WITH FUSION Bilateral 05/08/2015   Procedure: ENDOSCOPIC BILATERAL  ETHMOIDECTOMY, BILATERAL MAXILLARY ANTROSTOMY, BILATERAL SPHENOIDECTOMY, BILATERAL  FRONTAL RECESS EXPLORATION WITH FUSION NAVIGATION;  Surgeon: Daniel Moccasin, MD;  Location: [facility];  Service: ENT;  Laterality: Bilateral;    Social History:  reports that he quit smoking about 6 years ago. His smoking use included cigars. He has never used smokeless tobacco. He reports that he does not drink alcohol and does not use drugs.   No Known Allergies  Family History  Problem Relation Age of Onset    Hypertension Mother    Aneurysm Mother        brain died at 36   Stroke Mother    Pancreatic cancer Father    Colon cancer Neg Hx      Prior to Admission medications   Medication Sig Start Date End Date Taking? Authorizing Provider  acetaminophen  (TYLENOL ) 650 MG CR tablet Take 1,300 mg by mouth every 8 (eight) hours as needed for pain.   Yes [provider]  aspirin  EC 81 MG tablet Take 1 tablet (81 mg total) by mouth daily with breakfast. Swallow whole. 02/21/22  Yes Emokpae, Courage, MD  atorvastatin  (LIPITOR) 40 MG tablet Take 1 tablet (40 mg total) by mouth daily. 02/22/22  Yes Emokpae, Courage, MD  benazepril  (LOTENSIN ) 40 MG tablet Take 40 mg by mouth daily.   Yes [provider]  cholecalciferol (VITAMIN D3) 25 MCG (1000 UNIT) tablet Take 1,000 Units by mouth daily.   Yes [provider]  citalopram  (CELEXA ) 40 MG tablet Take 40 mg by mouth daily.   Yes [provider]  ferrous sulfate 325 (65 FE) MG EC tablet Take 650 mg by mouth daily with breakfast.   Yes [provider]  hydrochlorothiazide  (HYDRODIURIL ) 50 MG tablet Take 50 mg by mouth every morning.   Yes [provider]  metoprolol  succinate (TOPROL -XL) 25 MG 24 hr tablet Take 0.5 tablets (12.5 mg total) by mouth daily. 02/21/22  Yes Emokpae, Courage, MD  Multiple Vitamins-Minerals (MULTIVITAMIN ADULT PO) Take by mouth.   Yes [provider]  nitroGLYCERIN  (NITROSTAT ) 0.4 MG SL tablet Place 0.4 mg under the tongue every 5 (five) minutes as needed for chest pain.   Yes [provider]  Omega-3 Fatty Acids (OMEGA-3 FISH OIL PO) Take 1 capsule by mouth daily.   Yes [provider]  OVER THE COUNTER MEDICATION Take 1 tablet by mouth daily. Beets chews   Yes [provider]    Physical Exam: BP (!) 100/58   Pulse 94   Temp 98.1 F (36.7 C)   Resp (!) 22   Ht 6' (1.829 m)   Wt 99.8 kg   SpO2 93%   BMI 29.84 kg/m   General: 83 y.o.  year-old male well developed well nourished in no acute distress.  Alert and oriented x3. HEENT: NCAT, EOMI Neck: Supple, trachea medial Cardiovascular: Irregular rate and rhythm with no rubs or gallops.  No thyromegaly or JVD noted.  No lower extremity edema. 2/4 pulses in all 4 extremities. Respiratory: Clear to auscultation with no wheezes or rales. Good inspiratory effort. Abdomen: Soft, nontender nondistended with normal bowel sounds x4 quadrants. Muskuloskeletal: No cyanosis, clubbing or edema noted bilaterally Neuro: CN II-XII intact, strength 5/5 x 4, sensation, reflexes intact Skin: No ulcerative lesions noted or rashes Psychiatry: Judgement and insight appear normal. Mood is appropriate for  condition and setting          Labs on Admission:  Basic Metabolic Panel: Recent Labs  Lab 01/30/24 1631  NA 145  K 3.6  CL 102  CO2 25  GLUCOSE 154*  BUN 20  CREATININE 1.39*  CALCIUM  10.1   Liver Function Tests: No results for input(s): AST, ALT, ALKPHOS, BILITOT, PROT, ALBUMIN in the last 168 hours. No results for input(s): LIPASE, AMYLASE in the last 168 hours. No results for input(s): AMMONIA in the last 168 hours. CBC: Recent Labs  Lab 01/30/24 1631  WBC 7.9  HGB 12.2*  HCT 37.5*  MCV 90.8  PLT 155   Cardiac Enzymes: No results for input(s): CKTOTAL, CKMB, CKMBINDEX, TROPONINI in the last 168 hours.  BNP (last 3 results) No results for input(s): BNP in the last 8760 hours.  ProBNP (last 3 results) No results for input(s): PROBNP in the last 8760 hours.  CBG: No results for input(s): GLUCAP in the last 168 hours.  Radiological Exams on Admission: CT Angio Chest PE W and/or Wo Contrast Result Date: 01/30/2024 CLINICAL DATA:  Pulmonary embolism (PE) suspected, low to intermediate prob, positive D-dimer. Chest pain. EXAM: CT ANGIOGRAPHY CHEST WITH CONTRAST TECHNIQUE: Multidetector CT imaging of the chest was performed using the  standard protocol during bolus administration of intravenous contrast. Multiplanar CT image reconstructions and MIPs were obtained to evaluate the vascular anatomy. RADIATION DOSE REDUCTION: This exam was performed according to the departmental dose-optimization program which includes automated exposure control, adjustment of the mA and/or kV according to patient size and/or use of iterative reconstruction technique. CONTRAST:  75mL OMNIPAQUE IOHEXOL 350 MG/ML SOLN COMPARISON:  None Available. FINDINGS: Cardiovascular: Heart is normal size. Aorta is normal caliber. No filling defects in the pulmonary arteries to suggest pulmonary emboli. Aortic atherosclerosis. Mediastinum/Nodes: No mediastinal, hilar, or axillary adenopathy. Trachea and esophagus are unremarkable. Thyroid  unremarkable. Lungs/Pleura: No confluent airspace opacities or effusions. Upper Abdomen: No acute findings. Musculoskeletal: Chest wall soft tissues are unremarkable. No acute bony abnormality. Review of the MIP images confirms the above findings. IMPRESSION: No evidence of pulmonary embolus. No acute cardiopulmonary disease. Aortic Atherosclerosis (ICD10-I70.0). Electronically Signed   By: Franky Crease M.D.   On: 01/30/2024 20:15   DG Chest Port 1 View Result Date: 01/30/2024 EXAM: 1 VIEW(S) XRAY OF THE CHEST 01/30/2024 04:34:00 PM COMPARISON: 09/12/2020 CLINICAL HISTORY: Chest pain. FINDINGS: LUNGS AND PLEURA: No focal pulmonary opacity. No pulmonary edema. No pleural effusion. No pneumothorax. HEART AND MEDIASTINUM: No acute abnormality of the cardiac and mediastinal silhouettes. BONES AND SOFT TISSUES: No acute osseous abnormality. IMPRESSION: 1. No acute cardiopulmonary disease. Electronically signed by: Lynwood Seip MD 01/30/2024 04:41 PM EDT RP Workstation: [id number]    EKG: I independently viewed the EKG done and my findings are as followed: A-fib with RVR with nonspecific ST depression and QTc of 533 ms  Assessment/Plan Present  on Admission:  Paroxysmal atrial fibrillation with RVR (HCC)  Essential hypertension  Depression  Principal Problem:   Paroxysmal atrial fibrillation with RVR (HCC) Active Problems:   Essential hypertension   Depression   Mixed hyperlipidemia   CAD (coronary artery disease)  Paroxysmal atrial fibrillation with RVR EKG personally reviewed showed A-fib with RVR with nonspecific ST depression and QTc of 533 ms Patient was started on IV Cardizem drip, we shall continue with same at this time Continue therapeutic Lovenox  with plan to transition to oral DOAC in the morning Consider cardiology consult prior to discharge  Essential hypertension Continue  IV Cardizem drip as indicated above Lotensin , HCTZ, Toprol -XL temporarily held at this time in setting of Cardizem drip so as to avoid hypotension  Mixed hyperlipidemia Continue Lipitor  CAD Continue aspirin , Lipitor Toprol -XL temporarily held at this time due to patient being on Cardizem drip (to avoid hypotension)  Depression Continue Celexa   DVT prophylaxis: Lovenox   Code Status: Full code  Family Communication: Wife at bedside (all questions answered to satisfaction)  Consults: None  Severity of Illness: The appropriate patient status for this patient is OBSERVATION. Observation status is judged to be reasonable and necessary in order to provide the required intensity of service to ensure the patient's safety. The patient's presenting symptoms, physical exam findings, and initial radiographic and laboratory data in the context of their medical condition is felt to place them at decreased risk for further clinical deterioration. Furthermore, it is anticipated that the patient will be medically stable for discharge from the hospital within 2 midnights of admission.   Author: Mashell Sieben, DO 01/31/2024 1:45 AM  For on call review [email or web address].

## 2024-01-31 ENCOUNTER — Observation Stay (HOSPITAL_COMMUNITY)

## 2024-01-31 ENCOUNTER — Other Ambulatory Visit: Payer: Self-pay

## 2024-01-31 ENCOUNTER — Encounter (HOSPITAL_COMMUNITY): Payer: Self-pay | Admitting: Internal Medicine

## 2024-01-31 DIAGNOSIS — I10 Essential (primary) hypertension: Secondary | ICD-10-CM | POA: Diagnosis not present

## 2024-01-31 DIAGNOSIS — I4891 Unspecified atrial fibrillation: Secondary | ICD-10-CM | POA: Diagnosis not present

## 2024-01-31 DIAGNOSIS — E782 Mixed hyperlipidemia: Secondary | ICD-10-CM | POA: Diagnosis present

## 2024-01-31 DIAGNOSIS — I2510 Atherosclerotic heart disease of native coronary artery without angina pectoris: Secondary | ICD-10-CM | POA: Diagnosis present

## 2024-01-31 DIAGNOSIS — I480 Paroxysmal atrial fibrillation: Secondary | ICD-10-CM | POA: Diagnosis not present

## 2024-01-31 LAB — BASIC METABOLIC PANEL WITH GFR
Anion gap: 14 (ref 5–15)
BUN: 19 mg/dL (ref 8–23)
CO2: 25 mmol/L (ref 22–32)
Calcium: 10.1 mg/dL (ref 8.9–10.3)
Chloride: 101 mmol/L (ref 98–111)
Creatinine, Ser: 1.29 mg/dL — ABNORMAL HIGH (ref 0.61–1.24)
GFR, Estimated: 55 mL/min — ABNORMAL LOW (ref >60)
Glucose, Bld: 144 mg/dL — ABNORMAL HIGH (ref 70–99)
Potassium: 3.6 mmol/L (ref 3.5–5.1)
Sodium: 140 mmol/L (ref 135–145)

## 2024-01-31 LAB — ECHOCARDIOGRAM COMPLETE
AR max vel: 2.49 cm2
AV Area VTI: 2.33 cm2
AV Area mean vel: 2.61 cm2
AV Mean grad: 5 mmHg
AV Peak grad: 7.7 mmHg
Ao pk vel: 1.39 m/s
Area-P 1/2: 1.8 cm2
Height: 72 in
MV VTI: 1.49 cm2
S' Lateral: 2.1 cm
Weight: 3520 [oz_av]

## 2024-01-31 LAB — TROPONIN T, HIGH SENSITIVITY
Troponin T High Sensitivity: 38 ng/L — ABNORMAL HIGH (ref 0–19)
Troponin T High Sensitivity: 36 ng/L — ABNORMAL HIGH (ref 0–19)
Troponin T High Sensitivity: 32 ng/L — ABNORMAL HIGH (ref 0–19)
Troponin T High Sensitivity: 36 ng/L — ABNORMAL HIGH (ref 0–19)

## 2024-01-31 LAB — MAGNESIUM: Magnesium: 1.7 mg/dL (ref 1.7–2.4)

## 2024-01-31 LAB — TSH: TSH: 3.85 u[IU]/mL (ref 0.350–4.500)

## 2024-01-31 MED ORDER — ENOXAPARIN SODIUM 100 MG/ML IJ SOSY: 100.0000 mg | PREFILLED_SYRINGE | Freq: Two times a day (BID) | INTRAMUSCULAR | Status: DC

## 2024-01-31 MED ORDER — FENTANYL CITRATE (PF) 100 MCG/2ML IJ SOLN
12.5000 ug | INTRAMUSCULAR | Status: DC | PRN
Start: 2024-01-31 — End: 2024-01-31
  Administered 2024-01-31: 12.5 ug via INTRAVENOUS
  Filled 2024-01-31: qty 2

## 2024-01-31 MED ORDER — PANTOPRAZOLE SODIUM 40 MG PO TBEC
40.0000 mg | DELAYED_RELEASE_TABLET | Freq: Every day | ORAL | Status: DC
  Administered 2024-01-31 – 2024-02-02 (×3): 40 mg via ORAL
  Filled 2024-01-31 (×3): qty 1

## 2024-01-31 MED ORDER — SALINE SPRAY 0.65 % NA SOLN: 1.0000 | NASAL | Status: DC | PRN

## 2024-01-31 MED ORDER — HYDROCORTISONE 1 % EX CREA: 1.0000 | TOPICAL_CREAM | Freq: Three times a day (TID) | CUTANEOUS | Status: DC | PRN

## 2024-01-31 MED ORDER — ATORVASTATIN CALCIUM 40 MG PO TABS
40.0000 mg | ORAL_TABLET | Freq: Every day | ORAL | Status: DC
Start: 2024-01-31 — End: 2024-02-02
  Administered 2024-01-31 – 2024-02-02 (×3): 40 mg via ORAL
  Filled 2024-01-31 (×3): qty 1

## 2024-01-31 MED ORDER — LORATADINE 10 MG PO TABS
10.0000 mg | ORAL_TABLET | Freq: Every day | ORAL | Status: DC | PRN
Start: 2024-01-31 — End: 2024-02-02

## 2024-01-31 MED ORDER — GUAIFENESIN-DM 100-10 MG/5ML PO SYRP: 5.0000 mL | ORAL_SOLUTION | ORAL | Status: DC | PRN

## 2024-01-31 MED ORDER — ALUM & MAG HYDROXIDE-SIMETH 200-200-20 MG/5ML PO SUSP
30.0000 mL | ORAL | Status: DC | PRN
  Administered 2024-01-31: 30 mL via ORAL
  Filled 2024-01-31: qty 30

## 2024-01-31 MED ORDER — HYDROCORTISONE (PERIANAL) 2.5 % EX CREA: 1.0000 | TOPICAL_CREAM | Freq: Four times a day (QID) | CUTANEOUS | Status: DC | PRN

## 2024-01-31 MED ORDER — CITALOPRAM HYDROBROMIDE 20 MG PO TABS
40.0000 mg | ORAL_TABLET | Freq: Every day | ORAL | Status: DC
Start: 2024-01-31 — End: 2024-02-02
  Administered 2024-01-31 – 2024-02-02 (×3): 40 mg via ORAL
  Filled 2024-01-31 (×3): qty 2

## 2024-01-31 MED ORDER — METOPROLOL SUCCINATE ER 25 MG PO TB24
25.0000 mg | ORAL_TABLET | Freq: Every day | ORAL | Status: DC
  Administered 2024-01-31 – 2024-02-02 (×3): 25 mg via ORAL
  Filled 2024-01-31 (×3): qty 1

## 2024-01-31 MED ORDER — APIXABAN 5 MG PO TABS
5.0000 mg | ORAL_TABLET | Freq: Two times a day (BID) | ORAL | Status: DC
  Administered 2024-01-31 – 2024-02-02 (×5): 5 mg via ORAL
  Filled 2024-01-31 (×5): qty 1

## 2024-01-31 MED ORDER — FENTANYL CITRATE (PF) 50 MCG/ML IJ SOSY
12.5000 ug | PREFILLED_SYRINGE | INTRAMUSCULAR | Status: DC | PRN
Start: 2024-01-31 — End: 2024-02-02

## 2024-01-31 MED ORDER — CARMEX CLASSIC LIP BALM EX OINT: 1.0000 | TOPICAL_OINTMENT | CUTANEOUS | Status: DC | PRN

## 2024-01-31 MED ORDER — PHENOL 1.4 % MT LIQD: 1.0000 | OROMUCOSAL | Status: DC | PRN

## 2024-01-31 MED ORDER — MAGNESIUM SULFATE 4 GM/100ML IV SOLN
4.0000 g | Freq: Once | INTRAVENOUS | Status: AC
  Administered 2024-01-31: 4 g via INTRAVENOUS
  Filled 2024-01-31: qty 100

## 2024-01-31 MED ORDER — ASPIRIN 81 MG PO TBEC
81.0000 mg | DELAYED_RELEASE_TABLET | Freq: Every day | ORAL | Status: DC
  Administered 2024-01-31 – 2024-02-02 (×3): 81 mg via ORAL
  Filled 2024-01-31 (×3): qty 1

## 2024-01-31 MED ORDER — POLYVINYL ALCOHOL 1.4 % OP SOLN: 1.0000 [drp] | OPHTHALMIC | Status: DC | PRN

## 2024-01-31 NOTE — Progress Notes (Signed)
 PHARMACY - ANTICOAGULATION CONSULT NOTE  Pharmacy Consult for enoxaparin => eliquis Indication: atrial fibrillation  No Known Allergies  Patient Measurements: Height: 6' (182.9 cm) Weight: 99.8 kg (220 lb) IBW/kg (Calculated) : 77.6 HEPARIN DW (KG): 97.8  Vital Signs: Temp: 98.1 F (36.7 C) (10/11 0600) Temp Source: Oral (10/10 2025) BP: 110/67 (10/11 0600) Pulse Rate: 75 (10/11 0600)  Labs: Recent Labs    01/30/24 1631  HGB 12.2*  HCT 37.5*  PLT 155  CREATININE 1.39*    Estimated Creatinine Clearance: 50.1 mL/min (A) (by C-G formula based on SCr of 1.39 mg/dL (H)).   Medical History: Past Medical History:  Diagnosis Date   Anemia, unspecified 09/11/2015   Anemia is due to giving platelets so frequently- we discussed slowing this down.     Anxiety and depression 04/30/2012   Comes and goes, never on medication    BPH (benign prostatic hyperplasia) 05/04/2014   Myrbetriq  per Dr. Christi for overactive bladder symptoms then came off and did not recur Also saw palmetto. 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 5 year?   Hx of colonic polyps    Hypercholesteremia    Hypertension    MYALGIA 07/29/2007   Qualifier: Diagnosis of  By: Orlie NP, Tammy     NECK PAIN 04/27/2010   Qualifier: Diagnosis of  By: Christi MD, Glendia HERO    Obesity    Osteoarthritis 04/22/2007   Cervical (turns whole body at times), hands in past. No rx.      Prostatitis    PROSTATITIS, HX OF 04/22/2007   Qualifier: Diagnosis of  By: Latisha CMA, Leigh     RENAL CALCULUS 04/22/2007    x3, tinnie doctor released him   Sinusitis, acute 03/26/2012    Assessment: 82yo male c/o CP without relief from SL NTG x2, found to be in Afib with RVR.  Now transitioning to eliquis for new onset afib  Goal of Therapy:  Anti-Xa level 0.6-1 units/ml 4hrs after LMWH dose given Monitor platelets by anticoagulation protocol: Yes   Plan:  D/C lovenox  Eliquis 5mg  po bid Educate on eliquis Monitor CBC and S/S of bleeding  Cherlyn Boers, BS Pharm D, BCPS Clinical Pharmacist   01/31/2024,8:00 AM

## 2024-01-31 NOTE — Progress Notes (Signed)
 Upon admission denies chest pain.  Vitals stable.   Alert and ambulated to bathroom.  Family at bedside

## 2024-01-31 NOTE — TOC Progression Note (Signed)
  Transition of Care [facility]) - Inpatient Brief Assessment   Patient Details  Name: MAURISIO RUDDY MRN: [id number] Date of Birth: [birth year]  Transition of Care [facility]) CM/SW Contact:    Sharlyne Stabs, RN Phone Number: 01/31/2024, 4:08 PM   Clinical Narrative: Patient admitted with Chest pain. CM at the bedside to complete OBS status, Patient has not notified the [place]. CM completed the [place] notification   ID #[id number]. Patient live home with his spouse, has no needs at the time.      Transition of Care Asessment: Insurance and Status: Insurance coverage has been reviewed Patient has primary care physician: Yes Home environment has been reviewed: Home with Spouse Prior level of function:: independent Prior/Current Home Services: No current home services Social Drivers of Health Review: SDOH reviewed no interventions necessary Readmission risk has been reviewed: Yes Transition of care needs: no transition of care needs at this time   Patient Details  Name: CHANCELER PULLIN MRN: [id number] Date of Birth: 1941/03/09  Transition of Care [facility]) CM/SW Contact  Sharlyne Stabs, RN Phone Number: 01/31/2024, 4:08 PM

## 2024-01-31 NOTE — Progress Notes (Signed)
 PROGRESS NOTE   Samuel Mendez  FMW:[id number] DOB: 1940/11/22 DOA: 01/30/2024 PCP: Shona Norleen PEDLAR, MD   Chief Complaint  Patient presents with   Chest Pain   Level of care: Stepdown  Brief Admission History:  83 y.o. male with medical history significant of hypertension, hyperlipidemia, CAD, depression who presents to the emergency department due to chest pain which occurred about 1 hour prior to arrival to the ED.  Chest pain was midsternal to midclavicular line of the left side of chest and was achy in nature per patient, he took 2 nitroglycerin  tablets without any improvement.  This was associated with some shortness of breath, sweating, dizziness and complained of 2 loose bowel movement during the episode.   ED Course:  In the emergency department, he was tachycardic, but other vital signs were within normal range.  Workup in the ED showed normocytic anemia.  BMP was normal except for blood glucose of 154, creatinine 1.39 (baseline creatinine at 1.2).  Troponin 16 >> 41, D-dimer 1.26.  CT angiography chest with contrast showed no evidence of pulmonary embolus.  Showed no acute cardiopulmonary disease.  Patient was treated with aspirin  324 mg x 1, IV Cardizem 10 mg x 1 was given and he was started on IV Cardizem drip.  Therapeutic Lovenox  x 1 was given, fentanyl  was given due to pain.  Chest pain resolved at bedside.   Assessment and Plan:  Paroxysmal atrial fibrillation with RVR EKG showed A-fib with RVR with nonspecific ST depression and QTc of 533 ms Patient was started on IV Cardizem drip and HR controlled this morning Will resume his home metoprolol  succinate but increase dose from 12.5 to 25 mg daily  Hopefully this will control his HR and we won't need to continue cardizem transition to oral DOAC today (apixaban) TTE pending TSH pending    Essential hypertension Continue IV Cardizem drip as indicated above Lotensin , HCTZ, Toprol -XL temporarily held at this time in setting  of Cardizem drip so as to avoid hypotension   Mixed hyperlipidemia Continue Lipitor   CAD Continue aspirin , Lipitor Toprol -XL temporarily held at this time due to patient being on Cardizem drip (to avoid hypotension)   Depression Continue Celexa   DVT prophylaxis: apixaban Code Status: Full  Family Communication:  Disposition: Status is: Observation    Consultants:   Procedures:   Antimicrobials:    Subjective: Pt having intermittent chest pain   Objective: Vitals:   01/31/24 0900 01/31/24 0945 01/31/24 1100 01/31/24 1200  BP: 124/60  (!) 135/58 123/72  Pulse: (!) 109  71 68  Resp: (!) 26  16 20   Temp:  98.5 F (36.9 C)    TempSrc:  Axillary    SpO2: 95%  94% 97%  Weight:      Height:        Intake/Output Summary (Last 24 hours) at 01/31/2024 1253 Last data filed at 01/31/2024 1002 Gross per 24 hour  Intake 105.58 ml  Output 375 ml  Net -269.42 ml   Filed Weights   01/30/24 1614  Weight: 99.8 kg   Examination:  General exam: Appears calm and comfortable  Respiratory system: Clear to auscultation. Respiratory effort normal. Cardiovascular system: irregularly irregular, normal S1 & S2 heard. No JVD, murmurs, rubs, gallops or clicks. No pedal edema. Gastrointestinal system: Abdomen is nondistended, soft and nontender. No organomegaly or masses felt. Normal bowel sounds heard. Central nervous system: Alert and oriented. No focal neurological deficits. Extremities: Symmetric 5 x 5 power. Skin: No rashes, lesions  or ulcers. Psychiatry: Judgement and insight appear normal. Mood & affect appropriate.   Data Reviewed: I have personally reviewed following labs and imaging studies  CBC: Recent Labs  Lab 01/30/24 1631  WBC 7.9  HGB 12.2*  HCT 37.5*  MCV 90.8  PLT 155    Basic Metabolic Panel: Recent Labs  Lab 01/30/24 1631 01/31/24 1009  NA 145 140  K 3.6 3.6  CL 102 101  CO2 25 25  GLUCOSE 154* 144*  BUN 20 19  CREATININE 1.39* 1.29*   CALCIUM  10.1 10.1  MG  --  1.7    CBG: No results for input(s): GLUCAP in the last 168 hours.  No results found for this or any previous visit (from the past 240 hours).   Radiology Studies: DG Chest Port 1 View Result Date: 01/31/2024 EXAM: 1 VIEW(S) XRAY OF THE CHEST 01/31/2024 10:16:50 AM COMPARISON: 01/30/2024 CLINICAL HISTORY: Chest pain FINDINGS: LUNGS AND PLEURA: Low lung volumes. No focal pulmonary opacity. No pulmonary edema. No pleural effusion. No pneumothorax. HEART AND MEDIASTINUM: Aortic atherosclerosis. No acute abnormality of the cardiac and mediastinal silhouettes. BONES AND SOFT TISSUES: No acute osseous abnormality. IMPRESSION: 1. No acute cardiopulmonary process. Electronically signed by: Waddell Calk MD 01/31/2024 10:22 AM EDT RP Workstation: [id number]   CT Angio Chest PE W and/or Wo Contrast Result Date: 01/30/2024 CLINICAL DATA:  Pulmonary embolism (PE) suspected, low to intermediate prob, positive D-dimer. Chest pain. EXAM: CT ANGIOGRAPHY CHEST WITH CONTRAST TECHNIQUE: Multidetector CT imaging of the chest was performed using the standard protocol during bolus administration of intravenous contrast. Multiplanar CT image reconstructions and MIPs were obtained to evaluate the vascular anatomy. RADIATION DOSE REDUCTION: This exam was performed according to the departmental dose-optimization program which includes automated exposure control, adjustment of the mA and/or kV according to patient size and/or use of iterative reconstruction technique. CONTRAST:  75mL OMNIPAQUE IOHEXOL 350 MG/ML SOLN COMPARISON:  None Available. FINDINGS: Cardiovascular: Heart is normal size. Aorta is normal caliber. No filling defects in the pulmonary arteries to suggest pulmonary emboli. Aortic atherosclerosis. Mediastinum/Nodes: No mediastinal, hilar, or axillary adenopathy. Trachea and esophagus are unremarkable. Thyroid  unremarkable. Lungs/Pleura: No confluent airspace opacities or effusions.  Upper Abdomen: No acute findings. Musculoskeletal: Chest wall soft tissues are unremarkable. No acute bony abnormality. Review of the MIP images confirms the above findings. IMPRESSION: No evidence of pulmonary embolus. No acute cardiopulmonary disease. Aortic Atherosclerosis (ICD10-I70.0). Electronically Signed   By: Franky Crease M.D.   On: 01/30/2024 20:15   DG Chest Port 1 View Result Date: 01/30/2024 EXAM: 1 VIEW(S) XRAY OF THE CHEST 01/30/2024 04:34:00 PM COMPARISON: 09/12/2020 CLINICAL HISTORY: Chest pain. FINDINGS: LUNGS AND PLEURA: No focal pulmonary opacity. No pulmonary edema. No pleural effusion. No pneumothorax. HEART AND MEDIASTINUM: No acute abnormality of the cardiac and mediastinal silhouettes. BONES AND SOFT TISSUES: No acute osseous abnormality. IMPRESSION: 1. No acute cardiopulmonary disease. Electronically signed by: Lynwood Seip MD 01/30/2024 04:41 PM EDT RP Workstation: [id number]    Scheduled Meds:  apixaban  5 mg Oral BID   aspirin  EC  81 mg Oral Q breakfast   atorvastatin   40 mg Oral Daily   citalopram   40 mg Oral Daily   metoprolol  succinate  25 mg Oral Daily   Continuous Infusions:   LOS: 0 days   Critical Care Procedure Note Authorized and Performed by: KYM Louder MD  Total Critical Care time:  60 mins Due to a high probability of clinically significant, life threatening deterioration, the patient required my  highest level of preparedness to intervene emergently and I personally spent this critical care time directly and personally managing the patient.  This critical care time included obtaining a history; examining the patient, pulse oximetry; ordering and review of studies; arranging urgent treatment with development of a management plan; evaluation of patient's response of treatment; frequent reassessment; and discussions with other providers.  This critical care time was performed to assess and manage the high probability of imminent and life threatening  deterioration that could result in multi-organ failure.  It was exclusive of separately billable procedures and treating other patients and teaching time.    Afton Louder, MD How to contact the [facility] Attending or Consulting provider 7A - 7P or covering provider during after hours 7P -7A, for this patient?  Check the care team in [facility] and look for a) attending/consulting [facility] provider listed and b) the [facility] team listed Log into [email or web address] to find provider on call.  Locate the [facility] provider you are looking for under [facility] and page to a number that you can be directly reached. If you still have difficulty reaching the provider, please page the [facility] (Director on Call) for the Hospitalists listed on amion for assistance.  01/31/2024, 12:53 PM

## 2024-01-31 NOTE — Care Management Obs Status (Signed)
 MEDICARE OBSERVATION STATUS NOTIFICATION   Patient Details  Name: DAIMON KEAN MRN: [id number] Date of Birth: 11-Feb-1941   Medicare Observation Status Notification Given:  Yes    Sharlyne Stabs, RN 01/31/2024, 3:17 PM

## 2024-01-31 NOTE — ED Notes (Signed)
 ECHO is at bedside. Pt still expressing adequate pain control

## 2024-01-31 NOTE — Hospital Course (Signed)
 83 y.o. male with medical history significant of hypertension, hyperlipidemia, CAD, depression who presents to the emergency department due to chest pain which occurred about 1 hour prior to arrival to the ED.  Chest pain was midsternal to midclavicular line of the left side of chest and was achy in nature per patient, he took 2 nitroglycerin  tablets without any improvement.  This was associated with some shortness of breath, sweating, dizziness and complained of 2 loose bowel movement during the episode.   ED Course:  In the emergency department, he was tachycardic, but other vital signs were within normal range.  Workup in the ED showed normocytic anemia.  BMP was normal except for blood glucose of 154, creatinine 1.39 (baseline creatinine at 1.2).  Troponin 16 >> 41, D-dimer 1.26.  CT angiography chest with contrast showed no evidence of pulmonary embolus.  Showed no acute cardiopulmonary disease.  Patient was treated with aspirin  324 mg x 1, IV Cardizem 10 mg x 1 was given and he was started on IV Cardizem drip.  Therapeutic Lovenox  x 1 was given, fentanyl  was given due to pain.  Chest pain resolved at bedside.

## 2024-01-31 NOTE — Progress Notes (Signed)
 PHARMACY - ANTICOAGULATION CONSULT NOTE  Pharmacy Consult for enoxaparin  Indication: atrial fibrillation  No Known Allergies  Patient Measurements: Height: 6' (182.9 cm) Weight: 99.8 kg (220 lb) IBW/kg (Calculated) : 77.6 HEPARIN DW (KG): 97.8  Vital Signs: Temp: 98.1 F (36.7 C) (10/11 0000) Temp Source: Oral (10/10 2025) BP: 100/58 (10/11 0000) Pulse Rate: 94 (10/11 0000)  Labs: Recent Labs    01/30/24 1631  HGB 12.2*  HCT 37.5*  PLT 155  CREATININE 1.39*    Estimated Creatinine Clearance: 50.1 mL/min (A) (by C-G formula based on SCr of 1.39 mg/dL (H)).   Medical History: Past Medical History:  Diagnosis Date   Anemia, unspecified 09/11/2015   Anemia is due to giving platelets so frequently- we discussed slowing this down.     Anxiety and depression 04/30/2012   Comes and goes, never on medication    BPH (benign prostatic hyperplasia) 05/04/2014   Myrbetriq  per Dr. Christi for overactive bladder symptoms then came off and did not recur Also saw palmetto. 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 5 year?   Hx of colonic polyps    Hypercholesteremia    Hypertension    MYALGIA 07/29/2007   Qualifier: Diagnosis of  By: Orlie NP, Tammy     NECK PAIN 04/27/2010   Qualifier: Diagnosis of  By: Christi MD, Glendia HERO    Obesity    Osteoarthritis 04/22/2007   Cervical (turns whole body at times), hands in past. No rx.      Prostatitis    PROSTATITIS, HX OF 04/22/2007   Qualifier: Diagnosis of  By: Latisha CMA, Leigh     RENAL CALCULUS 04/22/2007   x3,  tinnie doctor released him   Sinusitis, acute 03/26/2012    Assessment: 83yo male c/o CP without relief from SL NTG x2, found to be in Afib with RVR, to start enoxaparin .  Goal of Therapy:  Anti-Xa level 0.6-1 units/ml 4hrs after LMWH dose given Monitor platelets by anticoagulation protocol: Yes   Plan:  Enoxaparin  100mg  SQ Q12H. Monitor CBC.  Marvetta Dauphin, PharmD, BCPS  01/31/2024,1:42 AM

## 2024-02-01 ENCOUNTER — Observation Stay (HOSPITAL_COMMUNITY)

## 2024-02-01 DIAGNOSIS — I480 Paroxysmal atrial fibrillation: Secondary | ICD-10-CM | POA: Diagnosis not present

## 2024-02-01 DIAGNOSIS — E782 Mixed hyperlipidemia: Secondary | ICD-10-CM | POA: Diagnosis not present

## 2024-02-01 DIAGNOSIS — I4891 Unspecified atrial fibrillation: Secondary | ICD-10-CM | POA: Diagnosis not present

## 2024-02-01 DIAGNOSIS — I10 Essential (primary) hypertension: Secondary | ICD-10-CM | POA: Diagnosis not present

## 2024-02-01 LAB — CBC
HCT: 32.1 % — ABNORMAL LOW (ref 39.0–52.0)
Hemoglobin: 10.7 g/dL — ABNORMAL LOW (ref 13.0–17.0)
MCH: 29.8 pg (ref 26.0–34.0)
MCHC: 33.3 g/dL (ref 30.0–36.0)
MCV: 89.4 fL (ref 80.0–100.0)
Platelets: 127 K/uL — ABNORMAL LOW (ref 150–400)
RBC: 3.59 MIL/uL — ABNORMAL LOW (ref 4.22–5.81)
RDW: 12.5 % (ref 11.5–15.5)
WBC: 10.7 K/uL — ABNORMAL HIGH (ref 4.0–10.5)
nRBC: 0 % (ref 0.0–0.2)

## 2024-02-01 LAB — BASIC METABOLIC PANEL WITH GFR
Anion gap: 12 (ref 5–15)
BUN: 23 mg/dL (ref 8–23)
CO2: 26 mmol/L (ref 22–32)
Calcium: 9.1 mg/dL (ref 8.9–10.3)
Chloride: 100 mmol/L (ref 98–111)
Creatinine, Ser: 1.51 mg/dL — ABNORMAL HIGH (ref 0.61–1.24)
GFR, Estimated: 46 mL/min — ABNORMAL LOW (ref >60)
Glucose, Bld: 98 mg/dL (ref 70–99)
Potassium: 3.8 mmol/L (ref 3.5–5.1)
Sodium: 138 mmol/L (ref 135–145)

## 2024-02-01 LAB — MAGNESIUM: Magnesium: 2.3 mg/dL (ref 1.7–2.4)

## 2024-02-01 MED ORDER — POTASSIUM CHLORIDE CRYS ER 20 MEQ PO TBCR
20.0000 meq | EXTENDED_RELEASE_TABLET | Freq: Once | ORAL | Status: AC
  Administered 2024-02-01: 20 meq via ORAL
  Filled 2024-02-01: qty 1

## 2024-02-01 NOTE — Progress Notes (Signed)
 PROGRESS NOTE   TERREN JANDREAU  FMW:[id number] DOB: [birth year] DOA: 01/30/2024 PCP: Shona Norleen PEDLAR, MD   Chief Complaint  Patient presents with   Chest Pain   Level of care: Telemetry  Brief Admission History:  83 y.o. male with medical history significant of hypertension, hyperlipidemia, CAD, depression who presents to the emergency department due to chest pain which occurred about 1 hour prior to arrival to the ED.  Chest pain was midsternal to midclavicular line of the left side of chest and was achy in nature per patient, he took 2 nitroglycerin  tablets without any improvement.  This was associated with some shortness of breath, sweating, dizziness and complained of 2 loose bowel movement during the episode.   ED Course:  In the emergency department, he was tachycardic, but other vital signs were within normal range.  Workup in the ED showed normocytic anemia.  BMP was normal except for blood glucose of 154, creatinine 1.39 (baseline creatinine at 1.2).  Troponin 16 >> 41, D-dimer 1.26.  CT angiography chest with contrast showed no evidence of pulmonary embolus.  Showed no acute cardiopulmonary disease.  Patient was treated with aspirin  324 mg x 1, IV Cardizem 10 mg x 1 was given and he was started on IV Cardizem drip.  Therapeutic Lovenox  x 1 was given, fentanyl  was given due to pain.  Chest pain resolved at bedside.   Assessment and Plan:  Paroxysmal atrial fibrillation with RVR EKG showed A-fib with RVR with nonspecific ST depression and QTc of 533 ms Patient was started on IV Cardizem drip and HR controlled this morning We have restarted his home metoprolol  succinate but increased dose from 12.5 to 25 mg daily  Hopefully this will control his HR, will start ambulating him more today  transitioned to oral DOAC (apixaban) TTE: LVEF 60-65%, The mitral valve is degenerative. Trivial mitral valve regurgitation. The mean mitral valve gradient is 3.0 mmHg. Moderate to severe mitral  annular calcification.  TSH: 3.850   Chest Discomfort --occurred during bout of Afib RVR --continue current medical management for now   Essential hypertension We have increased his home metoprolol  succinate dose to 25 mg daily    Mixed hyperlipidemia Continue Lipitor   CAD Continue aspirin , Lipitor Metoprolol  succinate 25 mg daily    Depression Continue Celexa   Headaches -- obtain CT head without contrast   DVT prophylaxis: apixaban Code Status: Full  Family Communication:  Disposition: anticipating return home     Consultants:   Procedures:   Antimicrobials:    Subjective: Pt complaining of headache and concerned about the chest discomfort he was feeling when had Afib RVR   Objective: Vitals:   01/31/24 1400 01/31/24 1449 01/31/24 2010 02/01/24 0401  BP: 113/61 122/63 121/60 (!) 117/58  Pulse: 63 63 64 (!) 57  Resp: 18 18 19 18   Temp: 98.5 F (36.9 C) 99.9 F (37.7 C) 98.7 F (37.1 C) 98.7 F (37.1 C)  TempSrc: Oral Oral    SpO2: (!) 87% 97% 97% 94%  Weight:  96.8 kg    Height:  6' (1.829 m)      Intake/Output Summary (Last 24 hours) at 02/01/2024 1154 Last data filed at 02/01/2024 0519 Gross per 24 hour  Intake 480 ml  Output --  Net 480 ml   Filed Weights   01/30/24 1614 01/31/24 1449  Weight: 99.8 kg 96.8 kg   Examination:  General exam: Appears calm and comfortable  Respiratory system: Clear to auscultation. Respiratory effort normal. Cardiovascular  system: irregularly irregular, normal S1 & S2 heard. No JVD, murmurs, rubs, gallops or clicks. No pedal edema. Gastrointestinal system: Abdomen is nondistended, soft and nontender. No organomegaly or masses felt. Normal bowel sounds heard. Central nervous system: Alert and oriented. No focal neurological deficits. Extremities: Symmetric 5 x 5 power. Skin: No rashes, lesions or ulcers. Psychiatry: Judgement and insight appear normal. Mood & affect appropriate.   Data Reviewed: I have  personally reviewed following labs and imaging studies  CBC: Recent Labs  Lab 01/30/24 1631 02/01/24 0408  WBC 7.9 10.7*  HGB 12.2* 10.7*  HCT 37.5* 32.1*  MCV 90.8 89.4  PLT 155 127*    Basic Metabolic Panel: Recent Labs  Lab 01/30/24 1631 01/31/24 1009 02/01/24 0408  NA 145 140 138  K 3.6 3.6 3.8  CL 102 101 100  CO2 25 25 26   GLUCOSE 154* 144* 98  BUN 20 19 23   CREATININE 1.39* 1.29* 1.51*  CALCIUM  10.1 10.1 9.1  MG  --  1.7 2.3    CBG: No results for input(s): GLUCAP in the last 168 hours.  No results found for this or any previous visit (from the past 240 hours).   Radiology Studies: CT HEAD WO CONTRAST ( ) Result Date: 02/01/2024 EXAM: CT HEAD WITHOUT CONTRAST 02/01/2024 10:31:00 AM TECHNIQUE: CT of the head was performed without the administration of intravenous contrast. Automated exposure control, iterative reconstruction, and/or weight based adjustment of the mA/kV was utilized to reduce the radiation dose to as low as reasonably achievable. COMPARISON: Brain MRI 02/20/2022 and head CT 02/20/2022. CLINICAL HISTORY: 83 year old male with headache, increasing frequency or severity. History of hypertension, hyperlipidemia, CAD, and depression. FINDINGS: BRAIN AND VENTRICLES: No acute hemorrhage. No evidence of acute infarct. No hydrocephalus. No extra-axial collection. No mass effect or midline shift. Brain volume remains normal for age. Mild for age chronic white matter changes in both hemispheres. Stable gray white differentiation. No suspicious intracranial vascular hyperdensity. ORBITS: No acute abnormality. SINUSES: Chronic paranasal sinus disease and postoperative changes to the paranasal sinuses which appear stable since 2023. Middle ears and mastoids well aerated. SOFT TISSUES AND SKULL: No acute soft tissue abnormality. No skull fracture. IMPRESSION: 1. No acute intracranial abnormality. Stable and largely normal for age non-contrast CT appearance of the  brain. 2. Stable chronic paranasal sinus disease. Electronically signed by: Helayne Hurst MD 02/01/2024 10:40 AM EDT RP Workstation: [id number]   ECHOCARDIOGRAM COMPLETE Result Date: 01/31/2024    ECHOCARDIOGRAM REPORT   Patient Name:   ZYION DOXTATER [facility] Date of Exam: 01/31/2024 Medical Rec #:  [id number]        Height:       72.0 in Accession #:    [id number]       Weight:       220.0 lb Date of Birth:  1941/03/07       BSA:          2.219 m Patient Age:    82 years         BP:           125/67 mmHg Patient Gender: M                HR:           65 bpm. Exam Location:  [facility] Procedure: 2D Echo, Cardiac Doppler and Color Doppler (Both Spectral and Color            Flow Doppler were utilized during procedure). Indications:  Atrial Fibrillation I48.91  History:        Patient has prior history of Echocardiogram examinations, most                 recent 02/21/2022. Risk Factors:Hypertension.  Sonographer:    Jayson Gaskins Referring Phys: [phone] Flavio Lindroth L Burnell Hurta IMPRESSIONS  1. Left ventricular ejection fraction, by estimation, is 60 to 65%. The left ventricle has normal function. The left ventricle has no regional wall motion abnormalities. There is mild concentric left ventricular hypertrophy. Left ventricular diastolic parameters are indeterminate.  2. Right ventricular systolic function is normal. The right ventricular size is normal.  3. The mitral valve is degenerative. Trivial mitral valve regurgitation. The mean mitral valve gradient is 3.0 mmHg. Moderate to severe mitral annular calcification.  4. The aortic valve is tricuspid. There is mild calcification of the aortic valve. Aortic valve regurgitation is mild. Aortic valve sclerosis/calcification is present, without any evidence of aortic stenosis. Aortic valve mean gradient measures 5.0 mmHg.  5. Aortic dilatation noted. There is mild dilatation of the aortic root, measuring 40 mm. There is moderate dilatation of the ascending aorta, measuring 42 mm.   6. Unable to estimate CVP. Comparison(s): Prior images reviewed side by side. LVEF 60-65%. Trivial mitral regurgitation with moderate to severe annular calcification. Aortic dilatation as outlined. FINDINGS  Left Ventricle: Left ventricular ejection fraction, by estimation, is 60 to 65%. The left ventricle has normal function. The left ventricle has no regional wall motion abnormalities. The left ventricular internal cavity size was normal in size. There is  mild concentric left ventricular hypertrophy. Left ventricular diastolic function could not be evaluated due to mitral annular calcification (moderate or greater). Left ventricular diastolic parameters are indeterminate. Right Ventricle: RV-RA gradient 19 mmHg suggesting normal estimated RVSP in the setting of normal CVP. The right ventricular size is normal. No increase in right ventricular wall thickness. Right ventricular systolic function is normal. Left Atrium: Left atrial size was normal in size. Right Atrium: Right atrial size was normal in size. Pericardium: There is no evidence of pericardial effusion. Mitral Valve: The mitral valve is degenerative in appearance. Moderate to severe mitral annular calcification. Trivial mitral valve regurgitation. MV peak gradient, 8.2 mmHg. The mean mitral valve gradient is 3.0 mmHg. Tricuspid Valve: The tricuspid valve is grossly normal. Tricuspid valve regurgitation is trivial. Aortic Valve: The aortic valve is tricuspid. There is mild calcification of the aortic valve. There is moderate aortic valve annular calcification. Aortic valve regurgitation is mild. Aortic valve sclerosis/calcification is present, without any evidence of aortic stenosis. Aortic valve mean gradient measures 5.0 mmHg. Aortic valve peak gradient measures 7.7 mmHg. Aortic valve area, by VTI measures 2.33 cm. Pulmonic Valve: The pulmonic valve was grossly normal. Pulmonic valve regurgitation is trivial. Aorta: Aortic dilatation noted. There is  mild dilatation of the aortic root, measuring 40 mm. There is moderate dilatation of the ascending aorta, measuring 42 mm. Venous: Unable to estimate CVP. The inferior vena cava was not well visualized. IAS/Shunts: No atrial level shunt detected by color flow Doppler. Additional Comments: 3D was performed not requiring image post processing on an independent workstation and was indeterminate.  LEFT VENTRICLE PLAX 2D LVIDd:         4.70 cm   Diastology LVIDs:         2.10 cm   LV e' medial:    4.46 cm/s LV PW:         1.10 cm   LV E/e' medial:  17.9 LV IVS:        1.10 cm   LV e' lateral:   7.62 cm/s LVOT diam:     2.00 cm   LV E/e' lateral: 10.5 LV SV:         69 LV SV Index:   31 LVOT Area:     3.14 cm  RIGHT VENTRICLE RV S prime:     11.70 cm/s TAPSE (M-mode): 2.5 cm LEFT ATRIUM             Index        RIGHT ATRIUM           Index LA Vol (A2C):   62.2 ml 28.04 ml/m  RA Area:     16.90 cm LA Vol (A4C):   42.2 ml 19.02 ml/m  RA Volume:   47.60 ml  21.45 ml/m LA Biplane Vol: 53.4 ml 24.07 ml/m  AORTIC VALVE AV Area (Vmax):    2.49 cm AV Area (Vmean):   2.61 cm AV Area (VTI):     2.33 cm AV Vmax:           139.00 cm/s AV Vmean:          104.000 cm/s AV VTI:            0.298 m AV Peak Grad:      7.7 mmHg AV Mean Grad:      5.0 mmHg LVOT Vmax:         110.00 cm/s LVOT Vmean:        86.500 cm/s LVOT VTI:          0.221 m LVOT/AV VTI ratio: 0.74  AORTA Ao Root diam: 4.00 cm Ao Asc diam:  4.20 cm MITRAL VALVE                TRICUSPID VALVE MV Area (PHT): 1.80 cm     TR Peak grad:   18.5 mmHg MV Area VTI:   1.49 cm     TR Vmax:        215.00 cm/s MV Peak grad:  8.2 mmHg MV Mean grad:  3.0 mmHg     SHUNTS MV Vmax:       1.43 m/s     Systemic VTI:  0.22 m MV Vmean:      74.9 cm/s    Systemic Diam: 2.00 cm MV Decel Time: 422 msec MV E velocity: 79.90 cm/s MV A velocity: 137.00 cm/s MV E/A ratio:  0.58 Jayson Sierras MD Electronically signed by Jayson Sierras MD Signature Date/Time: 01/31/2024/2:38:58 PM    Final     DG Chest Port 1 View Result Date: 01/31/2024 EXAM: 1 VIEW(S) XRAY OF THE CHEST 01/31/2024 10:16:50 AM COMPARISON: 01/30/2024 CLINICAL HISTORY: Chest pain FINDINGS: LUNGS AND PLEURA: Low lung volumes. No focal pulmonary opacity. No pulmonary edema. No pleural effusion. No pneumothorax. HEART AND MEDIASTINUM: Aortic atherosclerosis. No acute abnormality of the cardiac and mediastinal silhouettes. BONES AND SOFT TISSUES: No acute osseous abnormality. IMPRESSION: 1. No acute cardiopulmonary process. Electronically signed by: Waddell Calk MD 01/31/2024 10:22 AM EDT RP Workstation: [id number]   CT Angio Chest PE W and/or Wo Contrast Result Date: 01/30/2024 CLINICAL DATA:  Pulmonary embolism (PE) suspected, low to intermediate prob, positive D-dimer. Chest pain. EXAM: CT ANGIOGRAPHY CHEST WITH CONTRAST TECHNIQUE: Multidetector CT imaging of the chest was performed using the standard protocol during bolus administration of intravenous contrast. Multiplanar CT image reconstructions and MIPs were obtained to evaluate the vascular anatomy. RADIATION DOSE REDUCTION:  This exam was performed according to the departmental dose-optimization program which includes automated exposure control, adjustment of the mA and/or kV according to patient size and/or use of iterative reconstruction technique. CONTRAST:  75mL OMNIPAQUE IOHEXOL 350 MG/ML SOLN COMPARISON:  None Available. FINDINGS: Cardiovascular: Heart is normal size. Aorta is normal caliber. No filling defects in the pulmonary arteries to suggest pulmonary emboli. Aortic atherosclerosis. Mediastinum/Nodes: No mediastinal, hilar, or axillary adenopathy. Trachea and esophagus are unremarkable. Thyroid  unremarkable. Lungs/Pleura: No confluent airspace opacities or effusions. Upper Abdomen: No acute findings. Musculoskeletal: Chest wall soft tissues are unremarkable. No acute bony abnormality. Review of the MIP images confirms the above findings. IMPRESSION: No evidence of  pulmonary embolus. No acute cardiopulmonary disease. Aortic Atherosclerosis (ICD10-I70.0). Electronically Signed   By: Franky Crease M.D.   On: 01/30/2024 20:15   DG Chest Port 1 View Result Date: 01/30/2024 EXAM: 1 VIEW(S) XRAY OF THE CHEST 01/30/2024 04:34:00 PM COMPARISON: 09/12/2020 CLINICAL HISTORY: Chest pain. FINDINGS: LUNGS AND PLEURA: No focal pulmonary opacity. No pulmonary edema. No pleural effusion. No pneumothorax. HEART AND MEDIASTINUM: No acute abnormality of the cardiac and mediastinal silhouettes. BONES AND SOFT TISSUES: No acute osseous abnormality. IMPRESSION: 1. No acute cardiopulmonary disease. Electronically signed by: Lynwood Seip MD 01/30/2024 04:41 PM EDT RP Workstation: [id number]    Scheduled Meds:  apixaban  5 mg Oral BID   aspirin  EC  81 mg Oral Q breakfast   atorvastatin   40 mg Oral Daily   citalopram   40 mg Oral Daily   metoprolol  succinate  25 mg Oral Daily   pantoprazole  40 mg Oral Daily   Continuous Infusions:   LOS: 0 days   Time spent: 55 mins  Braun Rocca Vicci, MD How to contact the [facility] Attending or Consulting provider 7A - 7P or covering provider during after hours 7P -7A, for this patient?  Check the care team in [facility] and look for a) attending/consulting TRH provider listed and b) the TRH team listed Log into [email or web address] to find provider on call.  Locate the TR[facility] provider you are looking for under [facility] and page to a number that you can be directly reached. If you still have difficulty reaching the provider, please page the [facility] (Director on Call) for the Hospitalists listed on amion for assistance.  02/01/2024, 11:54 AM

## 2024-02-01 NOTE — Plan of Care (Signed)
   Problem: Education: Goal: Knowledge of General Education information will improve Description: Including pain rating scale, medication(s)/side effects and non-pharmacologic comfort measures Outcome: Progressing   Problem: Clinical Measurements: Goal: Ability to maintain clinical measurements within normal limits will improve Outcome: Progressing Goal: Cardiovascular complication will be avoided Outcome: Progressing

## 2024-02-01 NOTE — Evaluation (Signed)
 Physical Therapy Evaluation Patient Details Name: Samuel Mendez MRN: [id number] DOB: 1940-09-16 Today's Date: 02/01/2024  History of Present Illness  83 y.o. male with medical history significant of hypertension, hyperlipidemia, CAD, depression who presents to the emergency department due to chest pain which occurred about 1 hour prior to arrival to the ED.  Chest pain was midsternal to midclavicular line of the left side of chest and was achy in nature per patient, he took 2 nitroglycerin  tablets without any improvement.  This was associated with some shortness of breath, sweating, dizziness and complained of 2 loose bowel movement during the episode.   Clinical Impression  Patient demonstrates good LE strength, abnormal pain rating in right shoulder region from fall, and WFL functional mobility. Patient also demonstrates caution with ambulation during today's session with no need for AD, decreased velocity noted. Patient also demonstrates independence with bed mobility, functional transfers and ambulation. Patient requires education on PT recommendations, POC, and importance of consistent mobility. Patient discharged from acute physical therapy to care of nursing/mobility team for ambulation daily as tolerated for length of stay.          If plan is discharge home, recommend the following: Assistance with cooking/housework   Can travel by private vehicle        Equipment Recommendations None recommended by PT  Recommendations for Other Services       Functional Status Assessment Patient has had a recent decline in their functional status and demonstrates the ability to make significant improvements in function in a reasonable and predictable amount of time.     Precautions / Restrictions Precautions Precautions: Fall Recall of Precautions/Restrictions: Intact Restrictions Weight Bearing Restrictions Per Provider Order: No      Mobility  Bed Mobility Overal bed mobility:  Independent                  Transfers Overall transfer level: Independent                      Ambulation/Gait Ambulation/Gait assistance: Independent Gait Distance (Feet): 15 Feet Assistive device: None Gait Pattern/deviations: WFL(Within Functional Limits), Wide base of support Gait velocity: decreased     General Gait Details: WFL, cautious  Stairs            Wheelchair Mobility     Tilt Bed    Modified Rankin (Stroke Patients Only)       Balance Overall balance assessment: Independent                                           Pertinent Vitals/Pain Pain Assessment Pain Assessment: Faces Faces Pain Scale: Hurts little more Pain Location: R clavicle region from recent fall, scheduled to see orthocare Tuesday Pain Intervention(s): Limited activity within patient's tolerance, Repositioned    Home Living Family/patient expects to be discharged to:: Private residence Living Arrangements: Spouse/significant other Available Help at Discharge: Family;Available 24 hours/day Type of Home: House Home Access: Stairs to enter Entrance Stairs-Rails: Right Entrance Stairs-Number of Steps: 3-4   Home Layout: One level Home Equipment: Agricultural consultant (2 wheels);Cane - single point      Prior Function Prior Level of Function : Independent/Modified Independent             Mobility Comments: community ambulator with out AD ADLs Comments: independent, wife available if he needs help  Extremity/Trunk Assessment   Upper Extremity Assessment Upper Extremity Assessment: RUE deficits/detail RUE Deficits / Details: pt demonstrates pain with lifting RUE overhead, states he fell on this shoulder about a week ago, scheduled to see orthocare this Tuesday RUE: Unable to fully assess due to pain    Lower Extremity Assessment Lower Extremity Assessment: Overall WFL for tasks assessed    Cervical / Trunk Assessment Cervical /  Trunk Assessment: Normal  Communication   Communication Communication: No apparent difficulties    Cognition Arousal: Alert Behavior During Therapy: WFL for tasks assessed/performed   PT - Cognitive impairments: No apparent impairments                         Following commands: Intact       Cueing Cueing Techniques: Verbal cues     General Comments      Exercises     Assessment/Plan    PT Assessment Patient does not need any further PT services  PT Problem List         PT Treatment Interventions      PT Goals (Current goals can be found in the Care Plan section)  Acute Rehab PT Goals Patient Stated Goal: to return home and have ortho team assess clavicle from fall PT Goal Formulation: With patient Time For Goal Achievement: 02/01/24 Potential to Achieve Goals: Good    Frequency       Co-evaluation               AM-PAC PT 6 Clicks Mobility  Outcome Measure Help needed turning from your back to your side while in a flat bed without using bedrails?: None Help needed moving from lying on your back to sitting on the side of a flat bed without using bedrails?: None Help needed moving to and from a bed to a chair (including a wheelchair)?: None Help needed standing up from a chair using your arms (e.g., wheelchair or bedside chair)?: None Help needed to walk in hospital room?: None Help needed climbing 3-5 steps with a railing? : None 6 Click Score: 24    End of Session   Activity Tolerance: Patient tolerated treatment well;Patient limited by pain Patient left: in chair;with family/visitor present;with call bell/phone within reach   PT Visit Diagnosis: Pain Pain - Right/Left: Right Pain - part of body: Shoulder    Time: 1310-1320 PT Time Calculation (min) (ACUTE ONLY): 10 min   Charges:   PT Evaluation $PT Eval Low Complexity: 1 Low   PT General Charges $$ ACUTE PT VISIT: 1 Visit         Lang Ada, PT, DPT [facility] Office: [phone] 1:32 PM, 02/01/24

## 2024-02-02 ENCOUNTER — Other Ambulatory Visit (HOSPITAL_COMMUNITY): Payer: Self-pay

## 2024-02-02 ENCOUNTER — Telehealth (HOSPITAL_COMMUNITY): Payer: Self-pay | Admitting: Pharmacy Technician

## 2024-02-02 DIAGNOSIS — I480 Paroxysmal atrial fibrillation: Secondary | ICD-10-CM | POA: Diagnosis not present

## 2024-02-02 DIAGNOSIS — R079 Chest pain, unspecified: Secondary | ICD-10-CM

## 2024-02-02 DIAGNOSIS — I4891 Unspecified atrial fibrillation: Secondary | ICD-10-CM | POA: Diagnosis not present

## 2024-02-02 DIAGNOSIS — E782 Mixed hyperlipidemia: Secondary | ICD-10-CM | POA: Diagnosis not present

## 2024-02-02 DIAGNOSIS — I10 Essential (primary) hypertension: Secondary | ICD-10-CM | POA: Diagnosis not present

## 2024-02-02 LAB — BASIC METABOLIC PANEL WITH GFR
Anion gap: 10 (ref 5–15)
BUN: 25 mg/dL — ABNORMAL HIGH (ref 8–23)
CO2: 25 mmol/L (ref 22–32)
Calcium: 9 mg/dL (ref 8.9–10.3)
Chloride: 102 mmol/L (ref 98–111)
Creatinine, Ser: 1.43 mg/dL — ABNORMAL HIGH (ref 0.61–1.24)
GFR, Estimated: 49 mL/min — ABNORMAL LOW (ref >60)
Glucose, Bld: 99 mg/dL (ref 70–99)
Potassium: 4.5 mmol/L (ref 3.5–5.1)
Sodium: 137 mmol/L (ref 135–145)

## 2024-02-02 LAB — CBC
HCT: 32.3 % — ABNORMAL LOW (ref 39.0–52.0)
Hemoglobin: 10.6 g/dL — ABNORMAL LOW (ref 13.0–17.0)
MCH: 29.6 pg (ref 26.0–34.0)
MCHC: 32.8 g/dL (ref 30.0–36.0)
MCV: 90.2 fL (ref 80.0–100.0)
Platelets: 136 K/uL — ABNORMAL LOW (ref 150–400)
RBC: 3.58 MIL/uL — ABNORMAL LOW (ref 4.22–5.81)
RDW: 12.3 % (ref 11.5–15.5)
WBC: 10.7 K/uL — ABNORMAL HIGH (ref 4.0–10.5)
nRBC: 0 % (ref 0.0–0.2)

## 2024-02-02 MED ORDER — APIXABAN 5 MG PO TABS: 5.0000 mg | ORAL_TABLET | Freq: Two times a day (BID) | ORAL | 2 refills | Status: DC

## 2024-02-02 MED ORDER — METOPROLOL SUCCINATE ER 25 MG PO TB24: 25.0000 mg | ORAL_TABLET | Freq: Every day | ORAL | 2 refills | Status: DC

## 2024-02-02 MED ORDER — PANTOPRAZOLE SODIUM 40 MG PO TBEC: 40.0000 mg | DELAYED_RELEASE_TABLET | Freq: Every day | ORAL | 1 refills | Status: AC

## 2024-02-02 NOTE — Plan of Care (Signed)
   Problem: Education: Goal: Knowledge of General Education information will improve Description: Including pain rating scale, medication(s)/side effects and non-pharmacologic comfort measures Outcome: Progressing   Problem: Activity: Goal: Risk for activity intolerance will decrease Outcome: Progressing   Problem: Nutrition: Goal: Adequate nutrition will be maintained Outcome: Progressing

## 2024-02-02 NOTE — Progress Notes (Signed)
 Mobility Specialist Progress Note:    02/02/24 0930  Mobility  Activity Ambulated with assistance  Level of Assistance Modified independent, requires aide device or extra time  Assistive Device None  Distance Ambulated (ft) 130 ft  Range of Motion/Exercises Active;All extremities  Activity Response Tolerated well  Mobility Referral Yes  Mobility visit 1 Mobility  Mobility Specialist Start Time (ACUTE ONLY) 0930  Mobility Specialist Stop Time (ACUTE ONLY) 0950  Mobility Specialist Time Calculation (min) (ACUTE ONLY) 20 min   Pt received in chair, agreeable to mobility. ModI to stand and ambulate with no AD. Tolerated well,asx throughout. Returned to chair, all needs met.  Samuel Mendez Mobility Specialist Please contact via Special educational needs teacher or  Rehab office at [phone]

## 2024-02-02 NOTE — Telephone Encounter (Signed)
 Patient Product/process development scientist completed.    The patient is insured through [employer]. Patient has Medicare and is not eligible for a copay card, but may be able to apply for patient assistance or Medicare RX Payment Plan (Patient Must reach out to their plan, if eligible for payment plan), if available.    Ran test claim for Eliquis 5 mg and the current 30 day co-pay is $152.71.   This test claim was processed through [facility]- copay amounts may vary at other pharmacies due to pharmacy/plan contracts, or as the patient moves through the different stages of their insurance plan.     Reyes Sharps, CPHT Pharmacy Technician Patient Advocate Specialist [facility] Patient Advocate Team Direct Number: [phone]  Fax: [phone]

## 2024-02-02 NOTE — Consult Note (Signed)
 Cardiology Consultation   Patient ID: KYSEAN SWEET MRN: [id number]; DOB: [birth year]  Admit date: 01/30/2024 Date of Consult: 02/02/2024  PCP:  Shona Norleen PEDLAR, MD   [facility] Providers Cardiologist:  [facility]  Patient Profile: Samuel Mendez is a 83 y.o. male with a hx of HTN,HLD, nonobstructive CAD who is being seen 02/02/2024 for the evaluation of chest pain at the request of Dr Vicci.  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 Found to be in afib with RVR in ER, started on IV diltiazem drip. Converted to SR on dilt gtt, resolution of symptoms.    K 3.6 BUN 20 Cr 1.39 WBC 7.9 Hgb 12.2 Plt 155  Trop 16-->41-->38-->36-->32-->36 EKG afib RVR, no acute ischemic changes CXR: no acute process CT PE negative 01/2024 echo: LVEF 60-65%, no WMAs, normal RV, mild AI, aortic root 40mm and ascending 42 mm   History of chest pain, followed by [facility]. Per there notes 01/2022 cath for abnormal stress test showed only nonobstructive D1 disease.  Past Medical History:  Diagnosis Date   Anemia, unspecified 09/11/2015   Anemia is due to giving platelets so frequently- we discussed slowing this down.     Anxiety and depression 04/30/2012   Comes and goes, never on medication    BPH (benign prostatic hyperplasia) 05/04/2014   Myrbetriq  per Dr. Christi for overactive bladder symptoms then came off and did not recur Also saw palmetto. Nocturia 0-1x a night    Cerumen impaction 03/26/2012   COLONIC POLYPS 04/24/2007   04/2011 adenoma x3- 5 year repeat     Depression, major, single episode, moderate (HCC) 03/27/2017   Deviated septum    Diverticulosis of colon    Diverticulosis of large intestine 04/24/2007   Qualifier: Diagnosis of  By: Christi MD, Glendia HERO    DJD (degenerative joint disease)    GAD (generalized anxiety disorder) 03/27/2017   History  of adenomatous polyp of colon 06/13/2016   2.2018. 5 year?   Hx of colonic polyps    Hypercholesteremia    Hypertension    MYALGIA 07/29/2007   Qualifier: Diagnosis of  By: Orlie NP, Tammy     NECK PAIN 04/27/2010   Qualifier: Diagnosis of  By: Christi MD, Glendia HERO    Obesity    Osteoarthritis 04/22/2007   Cervical (turns whole body at times), hands in past. No rx.      Prostatitis    PROSTATITIS, HX OF 04/22/2007   Qualifier: Diagnosis of  By: Latisha CMA, Leigh     RENAL CALCULUS 04/22/2007   x3, tinnie doctor released him   Sinusitis, acute 03/26/2012    Past Surgical History:  Procedure Laterality Date   anal fistula repair     in 90s-outpatient repair   colles fracture left wrist after fall  11/2007   COLONOSCOPY     2013   SEPTOPLASTY N/A 05/08/2015   Procedure: NASAL SEPTOPLASTY;  Surgeon: Daniel Moccasin, MD;  Location: [facility];  Service: ENT;  Laterality: N/A;   SINUS ENDO WITH FUSION Bilateral 05/08/2015   Procedure: ENDOSCOPIC BILATERAL  ETHMOIDECTOMY, BILATERAL MAXILLARY ANTROSTOMY, BILATERAL SPHENOIDECTOMY, BILATERAL  FRONTAL RECESS EXPLORATION WITH FUSION NAVIGATION;  Surgeon: Daniel Moccasin, MD;  Location: [facility];  Service: ENT;  Laterality: Bilateral;       Scheduled Meds:  apixaban  5 mg Oral BID  atorvastatin   40 mg Oral Daily   citalopram   40 mg Oral Daily   metoprolol  succinate  25 mg Oral Daily   pantoprazole  40 mg Oral Daily   Continuous Infusions:  PRN Meds: alum & mag hydroxide-simeth, artificial tears, fentaNYL  (SUBLIMAZE ) injection, guaiFENesin-dextromethorphan, hydrocortisone, hydrocortisone cream, lip balm, loratadine, phenol, sodium chloride   Allergies:   No Known Allergies  Social History:   Social History   Socioeconomic History   Marital status: Married    Spouse name: cora x 40 yrs   Number of children: 3   Years of education: Not on file   Highest education level: Not on file  Occupational History    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 3 children, 2 from current wife. 4 grandkids. 1 greatgrandson 01/2019 - in [place]       Farms-raises cattle. Retired from truck driving.       Hobbies: TV, travel   Social Drivers of Corporate investment banker Strain: Not on file  Food Insecurity: No Food Insecurity (01/31/2024)   Hunger Vital Sign    Worried About Running Out of Food in the Last Year: Never true    Ran Out of Food in the Last Year: Never true  Transportation Needs: No Transportation Needs (01/31/2024)   PRAPARE - Administrator, Civil Service (Medical): No    Lack of Transportation (Non-Medical): No  Physical Activity: Not on file  Stress: Not on file  Social Connections: Socially Integrated (01/31/2024)   Social Connection and Isolation Panel    Frequency of Communication with Friends and Family: Three times a week    Frequency of Social Gatherings with Friends and Family: Three times a week    Attends Religious Services: 1 to 4 times per year    Active Member of Clubs or Organizations: No    Attends Banker Meetings: 1 to 4 times per year    Marital Status: Married  Catering manager Violence: Not At Risk (01/31/2024)   Humiliation, Afraid, Rape, and Kick questionnaire    Fear of Current or Ex-Partner: No    Emotionally Abused: No    Physically Abused: No    Sexually Abused: No    Family History:    Family History  Problem Relation Age of Onset    Hypertension Mother    Aneurysm Mother        brain died at 69   Stroke Mother    Pancreatic cancer Father    Colon cancer Neg Hx      ROS:  Please see the history of present illness.   All other ROS reviewed and negative.     Physical Exam/Data: Vitals:   02/01/24 0401 02/01/24 1512 02/01/24 1944 02/02/24 0407  BP: (!) 117/58 (!) 110/54 134/64 104/62  Pulse: (!) 57 (!) 53 63 (!) 56  Resp: 18  18 18  Temp: 98.7 F (37.1 C) 98.2 F (36.8 C) 98.9 F (37.2 C) 98.7 F (37.1 C)  TempSrc:  Oral    SpO2: 94% 93% (!) 89% 93%  Weight:      Height:        Intake/Output Summary (Last 24 hours) at 02/02/2024 0928 Last data filed at 02/02/2024 0830 Gross per 24 hour  Intake 1120 ml  Output --  Net 1120 ml      01/31/2024    2:49 PM 01/30/2024    4:14 PM 10/15/2022    1:28 PM  Last 3 Weights  Weight (lbs) 213 lb 6.5 oz 220 lb 226 lb 8 oz  Weight (kg) 96.8 kg 99.791 kg 102.74 kg     Body mass index is 28.94 kg/m.  General:  Well nourished, well developed, in no acute distress HEENT: normal Neck: no JVD Vascular: No carotid bruits; Distal pulses 2+ bilaterally Cardiac:  normal S1, S2; RRR; no murmur  Lungs:  clear to auscultation bilaterally, no wheezing, rhonchi or rales  Abd: soft, nontender, no hepatomegaly  Ext: no edema Musculoskeletal:  No deformities, BUE and BLE strength normal and equal Skin: warm and dry  Neuro:  CNs 2-12 intact, no focal abnormalities noted Psych:  Normal affect     Laboratory Data: High Sensitivity Troponin:  No results for input(s): TROPONINIHS in the last 720 hours.   Chemistry Recent Labs  Lab 01/31/24 1009 02/01/24 0408 02/02/24 0346  NA 140 138 137  K 3.6 3.8 4.5  CL 101 100 102  CO2 25 26 25   GLUCOSE 144* 98 99  BUN 19 23 25*  CREATININE 1.29* 1.51* 1.43*  CALCIUM  10.1 9.1 9.0  MG 1.7 2.3  --   GFRNONAA 55* 46* 49*  ANIONGAP 14 12 10     No results for input(s): PROT, ALBUMIN, AST, ALT, ALKPHOS,  BILITOT in the last 168 hours. Lipids No results for input(s): CHOL, TRIG, HDL, LABVLDL, LDLCALC, CHOLHDL in the last 168 hours.  Hematology Recent Labs  Lab 01/30/24 1631 02/01/24 0408 02/02/24 0346  WBC 7.9 10.7* 10.7*  RBC 4.13* 3.59* 3.58*  HGB 12.2* 10.7* 10.6*  HCT 37.5* 32.1* 32.3*  MCV 90.8 89.4 90.2  MCH 29.5 29.8 29.6  MCHC 32.5 33.3 32.8  RDW 12.5 12.5 12.3  PLT 155 127* 136*   Thyroid   Recent Labs  Lab 01/31/24 1019  TSH 3.850    BNPNo results for input(s): BNP, PROBNP in the last 168 hours.  DDimer  Recent Labs  Lab 01/30/24 1824  DDIMER 1.26*    Radiology/Studies:  CT HEAD WO CONTRAST ( ) Result Date: 02/01/2024 EXAM: CT HEAD WITHOUT CONTRAST 02/01/2024 10:31:00 AM TECHNIQUE: CT of the head was performed without the administration of intravenous contrast. Automated exposure control, iterative reconstruction, and/or weight based adjustment of the mA/kV was utilized to reduce the radiation dose to as low as reasonably achievable. COMPARISON: Brain MRI 02/20/2022 and head CT 02/20/2022. CLINICAL HISTORY: 83 year old male with headache, increasing frequency or severity. History of hypertension, hyperlipidemia, CAD, and depression. FINDINGS: BRAIN AND VENTRICLES: No acute hemorrhage. No evidence of acute infarct. No hydrocephalus. No extra-axial collection. No mass effect or midline shift. Brain volume remains normal for age. Mild for age chronic white matter changes in both hemispheres. Stable gray white differentiation. No suspicious intracranial vascular hyperdensity. ORBITS: No acute abnormality. SINUSES: Chronic paranasal sinus disease and postoperative changes to the paranasal sinuses which appear stable since 2023. Middle ears and mastoids well aerated. SOFT TISSUES  AND SKULL: No acute soft tissue abnormality. No skull fracture. IMPRESSION: 1. No acute intracranial abnormality. Stable and largely normal for age non-contrast CT appearance of the  brain. 2. Stable chronic paranasal sinus disease. Electronically signed by: Helayne Hurst MD 02/01/2024 10:40 AM EDT RP Workstation: [id number]   ECHOCARDIOGRAM COMPLETE Result Date: 01/31/2024    ECHOCARDIOGRAM REPORT   Patient Name:   Samuel Mendez [facility] Date of Exam: 01/31/2024 Medical Rec #:  [id number]        Height:       72.0 in Accession #:    [id number]       Weight:       220.0 lb Date of Birth:  Oct 30, 1940       BSA:          2.219 m Patient Age:    82 years         BP:           125/67 mmHg Patient Gender: M                HR:           65 bpm. Exam Location:  [facility] Procedure: 2D Echo, Cardiac Doppler and Color Doppler (Both Spectral and Color            Flow Doppler were utilized during procedure). Indications:    Atrial Fibrillation I48.91  History:        Patient has prior history of Echocardiogram examinations, most                 recent 02/21/2022. Risk Factors:Hypertension.  Sonographer:    Jayson Gaskins Referring Phys: [phone] CLANFORD L JOHNSON IMPRESSIONS  1. Left ventricular ejection fraction, by estimation, is 60 to 65%. The left ventricle has normal function. The left ventricle has no regional wall motion abnormalities. There is mild concentric left ventricular hypertrophy. Left ventricular diastolic parameters are indeterminate.  2. Right ventricular systolic function is normal. The right ventricular size is normal.  3. The mitral valve is degenerative. Trivial mitral valve regurgitation. The mean mitral valve gradient is 3.0 mmHg. Moderate to severe mitral annular calcification.  4. The aortic valve is tricuspid. There is mild calcification of the aortic valve. Aortic valve regurgitation is mild. Aortic valve sclerosis/calcification is present, without any evidence of aortic stenosis. Aortic valve mean gradient measures 5.0 mmHg.  5. Aortic dilatation noted. There is mild dilatation of the aortic root, measuring 40 mm. There is moderate dilatation of the ascending aorta, measuring 42 mm.   6. Unable to estimate CVP. Comparison(s): Prior images reviewed side by side. LVEF 60-65%. Trivial mitral regurgitation with moderate to severe annular calcification. Aortic dilatation as outlined. FINDINGS  Left Ventricle: Left ventricular ejection fraction, by estimation, is 60 to 65%. The left ventricle has normal function. The left ventricle has no regional wall motion abnormalities. The left ventricular internal cavity size was normal in size. There is  mild concentric left ventricular hypertrophy. Left ventricular diastolic function could not be evaluated due to mitral annular calcification (moderate or greater). Left ventricular diastolic parameters are indeterminate. Right Ventricle: RV-RA gradient 19 mmHg suggesting normal estimated RVSP in the setting of normal CVP. The right ventricular size is normal. No increase in right ventricular wall thickness. Right ventricular systolic function is normal. Left [facility]: Left atrial size was normal in size. Right [facility]: Right atrial size was normal in size. Pericardium: There is no evidence of pericardial effusion. Mitral Valve: The mitral valve is degenerative in  appearance. Moderate to severe mitral annular calcification. Trivial mitral valve regurgitation. MV peak gradient, 8.2 mmHg. The mean mitral valve gradient is 3.0 mmHg. Tricuspid Valve: The tricuspid valve is grossly normal. Tricuspid valve regurgitation is trivial. Aortic Valve: The aortic valve is tricuspid. There is mild calcification of the aortic valve. There is moderate aortic valve annular calcification. Aortic valve regurgitation is mild. Aortic valve sclerosis/calcification is present, without any evidence of aortic stenosis. Aortic valve mean gradient measures 5.0 mmHg. Aortic valve peak gradient measures 7.7 mmHg. Aortic valve area, by VTI measures 2.33 cm. Pulmonic Valve: The pulmonic valve was grossly normal. Pulmonic valve regurgitation is trivial. Aorta: Aortic dilatation noted. There is  mild dilatation of the aortic root, measuring 40 mm. There is moderate dilatation of the ascending aorta, measuring 42 mm. Venous: Unable to estimate CVP. The inferior vena cava was not well visualized. IAS/Shunts: No atrial level shunt detected by color flow Doppler. Additional Comments: 3D was performed not requiring image post processing on an independent workstation and was indeterminate.  LEFT VENTRICLE PLAX 2D LVIDd:         4.70 cm   Diastology LVIDs:         2.10 cm   LV e' medial:    4.46 cm/s LV PW:         1.10 cm   LV E/e' medial:  17.9 LV IVS:        1.10 cm   LV e' lateral:   7.62 cm/s LVOT diam:     2.00 cm   LV E/e' lateral: 10.5 LV SV:         69 LV SV Index:   31 LVOT Area:     3.14 cm  RIGHT VENTRICLE RV S prime:     11.70 cm/s TAPSE (M-mode): 2.5 cm LEFT ATRIUM             Index        RIGHT ATRIUM           Index LA Vol (A2C):   62.2 ml 28.04 ml/m  RA Area:     16.90 cm LA Vol (A4C):   42.2 ml 19.02 ml/m  RA Volume:   47.60 ml  21.45 ml/m LA Biplane Vol: 53.4 ml 24.07 ml/m  AORTIC VALVE AV Area (Vmax):    2.49 cm AV Area (Vmean):   2.61 cm AV Area (VTI):     2.33 cm AV Vmax:           139.00 cm/s AV Vmean:          104.000 cm/s AV VTI:            0.298 m AV Peak Grad:      7.7 mmHg AV Mean Grad:      5.0 mmHg LVOT Vmax:         110.00 cm/s LVOT Vmean:        86.500 cm/s LVOT VTI:          0.221 m LVOT/AV VTI ratio: 0.74  AORTA Ao Root diam: 4.00 cm Ao Asc diam:  4.20 cm MITRAL VALVE                TRICUSPID VALVE MV Area (PHT): 1.80 cm     TR Peak grad:   18.5 mmHg MV Area VTI:   1.49 cm     TR Vmax:        215.00 cm/s MV Peak grad:  8.2 mmHg MV Mean grad:  3.0  mmHg     SHUNTS MV Vmax:       1.43 m/s     Systemic VTI:  0.22 m MV Vmean:      74.9 cm/s    Systemic Diam: 2.00 cm MV Decel Time: 422 msec MV E velocity: 79.90 cm/s MV A velocity: 137.00 cm/s MV E/A ratio:  0.58 Jayson Sierras MD Electronically signed by Jayson Sierras MD Signature Date/Time: 01/31/2024/2:38:58 PM    Final     DG Chest Port 1 View Result Date: 01/31/2024 EXAM: 1 VIEW(S) XRAY OF THE CHEST 01/31/2024 10:16:50 AM COMPARISON: 01/30/2024 CLINICAL HISTORY: Chest pain FINDINGS: LUNGS AND PLEURA: Low lung volumes. No focal pulmonary opacity. No pulmonary edema. No pleural effusion. No pneumothorax. HEART AND MEDIASTINUM: Aortic atherosclerosis. No acute abnormality of the cardiac and mediastinal silhouettes. BONES AND SOFT TISSUES: No acute osseous abnormality. IMPRESSION: 1. No acute cardiopulmonary process. Electronically signed by: Waddell Calk MD 01/31/2024 10:22 AM EDT RP Workstation: [id number]   CT Angio Chest PE W and/or Wo Contrast Result Date: 01/30/2024 CLINICAL DATA:  Pulmonary embolism (PE) suspected, low to intermediate prob, positive D-dimer. Chest pain. EXAM: CT ANGIOGRAPHY CHEST WITH CONTRAST TECHNIQUE: Multidetector CT imaging of the chest was performed using the standard protocol during bolus administration of intravenous contrast. Multiplanar CT image reconstructions and MIPs were obtained to evaluate the vascular anatomy. RADIATION DOSE REDUCTION: This exam was performed according to the departmental dose-optimization program which includes automated exposure control, adjustment of the mA and/or kV according to patient size and/or use of iterative reconstruction technique. CONTRAST:  75mL OMNIPAQUE IOHEXOL 350 MG/ML SOLN COMPARISON:  None Available. FINDINGS: Cardiovascular: Heart is normal size. Aorta is normal caliber. No filling defects in the pulmonary arteries to suggest pulmonary emboli. Aortic atherosclerosis. Mediastinum/Nodes: No mediastinal, hilar, or axillary adenopathy. Trachea and esophagus are unremarkable. Thyroid  unremarkable. Lungs/Pleura: No confluent airspace opacities or effusions. Upper Abdomen: No acute findings. Musculoskeletal: Chest wall soft tissues are unremarkable. No acute bony abnormality. Review of the MIP images confirms the above findings. IMPRESSION: No evidence of  pulmonary embolus. No acute cardiopulmonary disease. Aortic Atherosclerosis (ICD10-I70.0). Electronically Signed   By: Franky Crease M.D.   On: 01/30/2024 20:15   DG Chest Port 1 View Result Date: 01/30/2024 EXAM: 1 VIEW(S) XRAY OF THE CHEST 01/30/2024 04:34:00 PM COMPARISON: 09/12/2020 CLINICAL HISTORY: Chest pain. FINDINGS: LUNGS AND PLEURA: No focal pulmonary opacity. No pulmonary edema. No pleural effusion. No pneumothorax. HEART AND MEDIASTINUM: No acute abnormality of the cardiac and mediastinal silhouettes. BONES AND SOFT TISSUES: No acute osseous abnormality. IMPRESSION: 1. No acute cardiopulmonary disease. Electronically signed by: Lynwood Seip MD 01/30/2024 04:41 PM EDT RP Workstation: [id number]     Assessment and Plan: 1.PAF - new diagnosis this admission -EKG with RVR on admission, subsequently has gone back in to NSR on dilt gtt - now on toprol  25mg  daily.  - CHADS2Vasc score is at least 4 (HTN, age x 2, CAD), he is on eliquis 5mg  bid. Can d/c his home asa - computer generated QTc is inaccurate, manual measure is  2. Chest pain - history of prior chest pain, cath 2023 at [facility] just mild D1 disease - symptoms this admission in setting of palpitations, afib with RVR. Resolved with control of arrhythmia - no ischemic testing is indcated.    Ok for discharge, he would like to establish with cardiology at [facility], we will arrange a follow up We will sign off inpatient care.      Risk Assessment/Risk Scores:  CHA2DS2-VASc Score = 4   This indicates a 4.8% annual risk of stroke. The patient's score is based upon: CHF History: 0 HTN History: 1 Diabetes History: 0 Stroke History: 0 Vascular Disease History: 1 Age Score: 2 Gender Score: 0     For questions or updates, please contact [facility] Please consult [email or web address] for contact info under      Signed, Alvan Carrier, MD  02/02/2024 9:28 AM

## 2024-02-02 NOTE — Discharge Summary (Signed)
 Physician Discharge Summary  Samuel Mendez FMW:[id number] DOB: [birth year] DOA: 01/30/2024  PCP: Shona Norleen PEDLAR, MD  Admit date: 01/30/2024 Discharge date: 02/02/2024  Admitted From:  HOME  Disposition: HOME   Recommendations for Outpatient Follow-up:  Follow up with PCP in 1 weeks Follow up with Cardiology clinic [facility] on 02/26/24  Please obtain BMP/CBC in 1-2 weeks  Discharge Condition: STABLE   CODE STATUS: FULL DIET: heart healthy foods recommended    Brief Hospitalization Summary: Please see all hospital notes, images, labs for full details of the hospitalization. Admission provider HPI:  83 y.o. male with medical history significant of hypertension, hyperlipidemia, CAD, depression who presents to the emergency department due to chest pain which occurred about 1 hour prior to arrival to the ED.  Chest pain was midsternal to midclavicular line of the left side of chest and was achy in nature per patient, he took 2 nitroglycerin  tablets without any improvement.  This was associated with some shortness of breath, sweating, dizziness and complained of 2 loose bowel movement during the episode.   ED Course:  In the emergency department, he was tachycardic, but other vital signs were within normal range.  Workup in the ED showed normocytic anemia.  BMP was normal except for blood glucose of 154, creatinine 1.39 (baseline creatinine at 1.2).  Troponin 16 >> 41, D-dimer 1.26.  CT angiography chest with contrast showed no evidence of pulmonary embolus.  Showed no acute cardiopulmonary disease.  Patient was treated with aspirin  324 mg x 1, IV Cardizem 10 mg x 1 was given and he was started on IV Cardizem drip.  Therapeutic Lovenox  x 1 was given, fentanyl  was given due to pain.  Chest pain resolved at bedside.  Hospital Course by listed problems addressed  Paroxysmal atrial fibrillation with RVR--TREATED  EKG showed A-fib with RVR with nonspecific ST depression and QTc of 533  ms Patient completed IV Cardizem drip and HR controlled this morning and in SR We have restarted his home metoprolol  succinate but increased dose from 12.5 to 25 mg daily  So far his HR has been controlled on this regimen transitioned to oral DOAC (apixaban) TTE: LVEF 60-65%, The mitral valve is degenerative. Trivial mitral valve regurgitation. The mean mitral valve gradient is 3.0 mmHg. Moderate to severe mitral annular calcification.  TSH: 3.850    Chest Discomfort - RESOLVED  --occurred during bout of Afib RVR --continue current medical management for now, DC ASA per cardiology now that he is on apixaban   Essential hypertension We have increased his home metoprolol  succinate dose to 25 mg daily  He is having soft BPs and we have held his benazepril  and hydrochlorothiazide     Mixed hyperlipidemia Continue Lipitor   CAD Continue aspirin , Lipitor Metoprolol  succinate 25 mg daily    Depression Continue Celexa    Headaches -- CT head without contrast with chronic paranasal sinus issues but no acute findings   Discharge Diagnoses:  Principal Problem:   Paroxysmal atrial fibrillation with RVR (HCC) Active Problems:   Essential hypertension   Depression   Mixed hyperlipidemia   CAD (coronary artery disease)   Atrial fibrillation with RVR [facility])   Discharge Instructions:  Allergies as of 02/02/2024   No Known Allergies      Medication List     STOP taking these medications    aspirin  EC 81 MG tablet   benazepril  40 MG tablet Commonly known as: LOTENSIN    hydrochlorothiazide  50 MG tablet Commonly known as: HYDRODIURIL   TAKE these medications    acetaminophen  650 MG CR tablet Commonly known as: TYLENOL  Take 1,300 mg by mouth every 8 (eight) hours as needed for pain.   apixaban 5 MG Tabs tablet Commonly known as: ELIQUIS Take 1 tablet (5 mg total) by mouth 2 (two) times daily.   atorvastatin  40 MG tablet Commonly known as: LIPITOR Take 1 tablet  (40 mg total) by mouth daily.   cholecalciferol 25 MCG (1000 UNIT) tablet Commonly known as: VITAMIN D3 Take 1,000 Units by mouth daily.   citalopram  40 MG tablet Commonly known as: CELEXA  Take 40 mg by mouth daily.   ferrous sulfate 325 (65 FE) MG EC tablet Take 650 mg by mouth daily with breakfast.   metoprolol  succinate 25 MG 24 hr tablet Commonly known as: TOPROL -XL Take 1 tablet (25 mg total) by mouth daily. What changed: how much to take   MULTIVITAMIN ADULT PO Take by mouth.   nitroGLYCERIN  0.4 MG SL tablet Commonly known as: NITROSTAT  Place 0.4 mg under the tongue every 5 (five) minutes as needed for chest pain.   OMEGA-3 FISH OIL PO Take 1 capsule by mouth daily.   OVER THE COUNTER MEDICATION Take 1 tablet by mouth daily. Beets chews   pantoprazole 40 MG tablet Commonly known as: PROTONIX Take 1 tablet (40 mg total) by mouth daily.        Follow-up Information     Chasen Mendell Laymon HERO, PA-C Follow up on 02/26/2024.   Specialties: Cardiology, Cardiology Why: at 3:30 pm, Hospital Follow Up Contact information: [address] [place] [address] [phone]         Shona Norleen PEDLAR, MD Follow up in 1 week(s).   Specialty: Internal Medicine Why: Hospital Follow Up Contact information: [address] [place] [facility] [address] [phone]                No Known Allergies Allergies as of 02/02/2024   No Known Allergies      Medication List     STOP taking these medications    aspirin  EC 81 MG tablet   benazepril  40 MG tablet Commonly known as: LOTENSIN    hydrochlorothiazide  50 MG tablet Commonly known as: HYDRODIURIL        TAKE these medications    acetaminophen  650 MG CR tablet Commonly known as: TYLENOL  Take 1,300 mg by mouth every 8 (eight) hours as needed for pain.   apixaban 5 MG Tabs tablet Commonly known as: ELIQUIS Take 1 tablet (5 mg total) by mouth 2 (two) times daily.   atorvastatin  40 MG tablet Commonly  known as: LIPITOR Take 1 tablet (40 mg total) by mouth daily.   cholecalciferol 25 MCG (1000 UNIT) tablet Commonly known as: VITAMIN D3 Take 1,000 Units by mouth daily.   citalopram  40 MG tablet Commonly known as: CELEXA  Take 40 mg by mouth daily.   ferrous sulfate 325 (65 FE) MG EC tablet Take 650 mg by mouth daily with breakfast.   metoprolol  succinate 25 MG 24 hr tablet Commonly known as: TOPROL -XL Take 1 tablet (25 mg total) by mouth daily. What changed: how much to take   MULTIVITAMIN ADULT PO Take by mouth.   nitroGLYCERIN  0.4 MG SL tablet Commonly known as: NITROSTAT  Place 0.4 mg under the tongue every 5 (five) minutes as needed for chest pain.   OMEGA-3 FISH OIL PO Take 1 capsule by mouth daily.   OVER THE COUNTER MEDICATION Take 1 tablet by mouth daily. Beets chews  pantoprazole 40 MG tablet Commonly known as: PROTONIX Take 1 tablet (40 mg total) by mouth daily.        Procedures/Studies: CT HEAD WO CONTRAST ( ) Result Date: 02/01/2024 EXAM: CT HEAD WITHOUT CONTRAST 02/01/2024 10:31:00 AM TECHNIQUE: CT of the head was performed without the administration of intravenous contrast. Automated exposure control, iterative reconstruction, and/or weight based adjustment of the mA/kV was utilized to reduce the radiation dose to as low as reasonably achievable. COMPARISON: Brain MRI 02/20/2022 and head CT 02/20/2022. CLINICAL HISTORY: 83 year old male with headache, increasing frequency or severity. History of hypertension, hyperlipidemia, CAD, and depression. FINDINGS: BRAIN AND VENTRICLES: No acute hemorrhage. No evidence of acute infarct. No hydrocephalus. No extra-axial collection. No mass effect or midline shift. Brain volume remains normal for age. Mild for age chronic white matter changes in both hemispheres. Stable gray white differentiation. No suspicious intracranial vascular hyperdensity. ORBITS: No acute abnormality. SINUSES: Chronic paranasal sinus disease  and postoperative changes to the paranasal sinuses which appear stable since 2023. Middle ears and mastoids well aerated. SOFT TISSUES AND SKULL: No acute soft tissue abnormality. No skull fracture. IMPRESSION: 1. No acute intracranial abnormality. Stable and largely normal for age non-contrast CT appearance of the brain. 2. Stable chronic paranasal sinus disease. Electronically signed by: Helayne Hurst MD 02/01/2024 10:40 AM EDT RP Workstation: [id number]   ECHOCARDIOGRAM COMPLETE Result Date: 01/31/2024    ECHOCARDIOGRAM REPORT   Patient Name:   DRAEDEN KELLMAN [facility] Date of Exam: 01/31/2024 Medical Rec #:  [id number]        Height:       72.0 in Accession #:    [id number]       Weight:       220.0 lb Date of Birth:  1940/04/27       BSA:          2.219 m Patient Age:    82 years         BP:           125/67 mmHg Patient Gender: M                HR:           65 bpm. Exam Location:  [facility] Procedure: 2D Echo, Cardiac Doppler and Color Doppler (Both Spectral and Color            Flow Doppler were utilized during procedure). Indications:    Atrial Fibrillation I48.91  History:        Patient has prior history of Echocardiogram examinations, most                 recent 02/21/2022. Risk Factors:Hypertension.  Sonographer:    Jayson Gaskins Referring Phys: [phone] Andris Brothers L Kenda Kloehn IMPRESSIONS  1. Left ventricular ejection fraction, by estimation, is 60 to 65%. The left ventricle has normal function. The left ventricle has no regional wall motion abnormalities. There is mild concentric left ventricular hypertrophy. Left ventricular diastolic parameters are indeterminate.  2. Right ventricular systolic function is normal. The right ventricular size is normal.  3. The mitral valve is degenerative. Trivial mitral valve regurgitation. The mean mitral valve gradient is 3.0 mmHg. Moderate to severe mitral annular calcification.  4. The aortic valve is tricuspid. There is mild calcification of the aortic valve. Aortic valve  regurgitation is mild. Aortic valve sclerosis/calcification is present, without any evidence of aortic stenosis. Aortic valve mean gradient measures 5.0 mmHg.  5. Aortic dilatation noted. There is mild dilatation of  the aortic root, measuring 40 mm. There is moderate dilatation of the ascending aorta, measuring 42 mm.  6. Unable to estimate CVP. Comparison(s): Prior images reviewed side by side. LVEF 60-65%. Trivial mitral regurgitation with moderate to severe annular calcification. Aortic dilatation as outlined. FINDINGS  Left Ventricle: Left ventricular ejection fraction, by estimation, is 60 to 65%. The left ventricle has normal function. The left ventricle has no regional wall motion abnormalities. The left ventricular internal cavity size was normal in size. There is  mild concentric left ventricular hypertrophy. Left ventricular diastolic function could not be evaluated due to mitral annular calcification (moderate or greater). Left ventricular diastolic parameters are indeterminate. Right Ventricle: RV-RA gradient 19 mmHg suggesting normal estimated RVSP in the setting of normal CVP. The right ventricular size is normal. No increase in right ventricular wall thickness. Right ventricular systolic function is normal. Left Atrium: Left atrial size was normal in size. Right Atrium: Right atrial size was normal in size. Pericardium: There is no evidence of pericardial effusion. Mitral Valve: The mitral valve is degenerative in appearance. Moderate to severe mitral annular calcification. Trivial mitral valve regurgitation. MV peak gradient, 8.2 mmHg. The mean mitral valve gradient is 3.0 mmHg. Tricuspid Valve: The tricuspid valve is grossly normal. Tricuspid valve regurgitation is trivial. Aortic Valve: The aortic valve is tricuspid. There is mild calcification of the aortic valve. There is moderate aortic valve annular calcification. Aortic valve regurgitation is mild. Aortic valve sclerosis/calcification is  present, without any evidence of aortic stenosis. Aortic valve mean gradient measures 5.0 mmHg. Aortic valve peak gradient measures 7.7 mmHg. Aortic valve area, by VTI measures 2.33 cm. Pulmonic Valve: The pulmonic valve was grossly normal. Pulmonic valve regurgitation is trivial. Aorta: Aortic dilatation noted. There is mild dilatation of the aortic root, measuring 40 mm. There is moderate dilatation of the ascending aorta, measuring 42 mm. Venous: Unable to estimate CVP. The inferior vena cava was not well visualized. IAS/Shunts: No atrial level shunt detected by color flow Doppler. Additional Comments: 3D was performed not requiring image post processing on an independent workstation and was indeterminate.  LEFT VENTRICLE PLAX 2D LVIDd:         4.70 cm   Diastology LVIDs:         2.10 cm   LV e' medial:    4.46 cm/s LV PW:         1.10 cm   LV E/e' medial:  17.9 LV IVS:        1.10 cm   LV e' lateral:   7.62 cm/s LVOT diam:     2.00 cm   LV E/e' lateral: 10.5 LV SV:         69 LV SV Index:   31 LVOT Area:     3.14 cm  RIGHT VENTRICLE RV S prime:     11.70 cm/s TAPSE (M-mode): 2.5 cm LEFT ATRIUM             Index        RIGHT ATRIUM           Index LA Vol (A2C):   62.2 ml 28.04 ml/m  RA Area:     16.90 cm LA Vol (A4C):   42.2 ml 19.02 ml/m  RA Volume:   47.60 ml  21.45 ml/m LA Biplane Vol: 53.4 ml 24.07 ml/m  AORTIC VALVE AV Area (Vmax):    2.49 cm AV Area (Vmean):   2.61 cm AV Area (VTI):     2.33 cm  AV Vmax:           139.00 cm/s AV Vmean:          104.000 cm/s AV VTI:            0.298 m AV Peak Grad:      7.7 mmHg AV Mean Grad:      5.0 mmHg LVOT Vmax:         110.00 cm/s LVOT Vmean:        86.500 cm/s LVOT VTI:          0.221 m LVOT/AV VTI ratio: 0.74  AORTA Ao Root diam: 4.00 cm Ao Asc diam:  4.20 cm MITRAL VALVE                TRICUSPID VALVE MV Area (PHT): 1.80 cm     TR Peak grad:   18.5 mmHg MV Area VTI:   1.49 cm     TR Vmax:        215.00 cm/s MV Peak grad:  8.2 mmHg MV Mean grad:  3.0 mmHg      SHUNTS MV Vmax:       1.43 m/s     Systemic VTI:  0.22 m MV Vmean:      74.9 cm/s    Systemic Diam: 2.00 cm MV Decel Time: 422 msec MV E velocity: 79.90 cm/s MV A velocity: 137.00 cm/s MV E/A ratio:  0.58 Jayson Sierras MD Electronically signed by Jayson Sierras MD Signature Date/Time: 01/31/2024/2:38:58 PM    Final    DG Chest Port 1 View Result Date: 01/31/2024 EXAM: 1 VIEW(S) XRAY OF THE CHEST 01/31/2024 10:16:50 AM COMPARISON: 01/30/2024 CLINICAL HISTORY: Chest pain FINDINGS: LUNGS AND PLEURA: Low lung volumes. No focal pulmonary opacity. No pulmonary edema. No pleural effusion. No pneumothorax. HEART AND MEDIASTINUM: Aortic atherosclerosis. No acute abnormality of the cardiac and mediastinal silhouettes. BONES AND SOFT TISSUES: No acute osseous abnormality. IMPRESSION: 1. No acute cardiopulmonary process. Electronically signed by: Waddell Calk MD 01/31/2024 10:22 AM EDT RP Workstation: [id number]   CT Angio Chest PE W and/or Wo Contrast Result Date: 01/30/2024 CLINICAL DATA:  Pulmonary embolism (PE) suspected, low to intermediate prob, positive D-dimer. Chest pain. EXAM: CT ANGIOGRAPHY CHEST WITH CONTRAST TECHNIQUE: Multidetector CT imaging of the chest was performed using the standard protocol during bolus administration of intravenous contrast. Multiplanar CT image reconstructions and MIPs were obtained to evaluate the vascular anatomy. RADIATION DOSE REDUCTION: This exam was performed according to the departmental dose-optimization program which includes automated exposure control, adjustment of the mA and/or kV according to patient size and/or use of iterative reconstruction technique. CONTRAST:  75mL OMNIPAQUE IOHEXOL 350 MG/ML SOLN COMPARISON:  None Available. FINDINGS: Cardiovascular: Heart is normal size. Aorta is normal caliber. No filling defects in the pulmonary arteries to suggest pulmonary emboli. Aortic atherosclerosis. Mediastinum/Nodes: No mediastinal, hilar, or axillary  adenopathy. Trachea and esophagus are unremarkable. Thyroid  unremarkable. Lungs/Pleura: No confluent airspace opacities or effusions. Upper Abdomen: No acute findings. Musculoskeletal: Chest wall soft tissues are unremarkable. No acute bony abnormality. Review of the MIP images confirms the above findings. IMPRESSION: No evidence of pulmonary embolus. No acute cardiopulmonary disease. Aortic Atherosclerosis (ICD10-I70.0). Electronically Signed   By: Franky Crease M.D.   On: 01/30/2024 20:15   DG Chest Port 1 View Result Date: 01/30/2024 EXAM: 1 VIEW(S) XRAY OF THE CHEST 01/30/2024 04:34:00 PM COMPARISON: 09/12/2020 CLINICAL HISTORY: Chest pain. FINDINGS: LUNGS AND PLEURA: No focal pulmonary opacity. No pulmonary edema. No pleural effusion. No pneumothorax.  HEART AND MEDIASTINUM: No acute abnormality of the cardiac and mediastinal silhouettes. BONES AND SOFT TISSUES: No acute osseous abnormality. IMPRESSION: 1. No acute cardiopulmonary disease. Electronically signed by: Lynwood Seip MD 01/30/2024 04:41 PM EDT RP Workstation: [id number]   DG Shoulder Right Result Date: 01/24/2024 CLINICAL DATA:  fell out of bed landing on the right shoulder EXAM: RIGHT SHOULDER - 2+ VIEW COMPARISON:  None Available. FINDINGS: There is no evidence of definite acute displaced fracture or dislocation. Cortical irregularity of the distal clavicle- limited evaluation due to overlapping osseous structures and overlying soft tissues. Acromioclavicular joint degenerative changes. No aggressive appearing focal bone abnormality. Soft tissues are unremarkable. IMPRESSION: Cortical irregularity of the distal clavicle- limited evaluation due to overlapping osseous structures and overlying soft tissues. Consider dedicated right clavicular radiographs for further evaluation. Underlying fracture not excluded. Electronically Signed   By: Morgane  Naveau M.D.   On: 01/24/2024 11:47     Subjective: Pt says he feels well today, no chest pain, no  headaches, no palpitations.  He has been ambulating in room today.   Discharge Exam: Vitals:   02/01/24 1944 02/02/24 0407  BP: 134/64 104/62  Pulse: 63 (!) 56  Resp: 18 18  Temp: 98.9 F (37.2 C) 98.7 F (37.1 C)  SpO2: (!) 89% 93%   Vitals:   02/01/24 0401 02/01/24 1512 02/01/24 1944 02/02/24 0407  BP: (!) 117/58 (!) 110/54 134/64 104/62  Pulse: (!) 57 (!) 53 63 (!) 56  Resp: 18  18 18   Temp: 98.7 F (37.1 C) 98.2 F (36.8 C) 98.9 F (37.2 C) 98.7 F (37.1 C)  TempSrc:  Oral    SpO2: 94% 93% (!) 89% 93%  Weight:      Height:       General: Pt is alert, awake, not in acute distress Cardiovascular: normal S1/S2 +, no rubs, no gallops Respiratory: CTA bilaterally, no wheezing, no rhonchi Abdominal: Soft, NT, ND, bowel sounds + Extremities: no edema, no cyanosis   The results of significant diagnostics from this hospitalization (including imaging, microbiology, ancillary and laboratory) are listed below for reference.     Microbiology: No results found for this or any previous visit (from the past 240 hours).   Labs: BNP (last 3 results) No results for input(s): BNP in the last 8760 hours. Basic Metabolic Panel: Recent Labs  Lab 01/30/24 1631 01/31/24 1009 02/01/24 0408 02/02/24 0346  NA 145 140 138 137  K 3.6 3.6 3.8 4.5  CL 102 101 100 102  CO2 25 25 26 25   GLUCOSE 154* 144* 98 99  BUN 20 19 23  25*  CREATININE 1.39* 1.29* 1.51* 1.43*  CALCIUM  10.1 10.1 9.1 9.0  MG  --  1.7 2.3  --    Liver Function Tests: No results for input(s): AST, ALT, ALKPHOS, BILITOT, PROT, ALBUMIN in the last 168 hours. No results for input(s): LIPASE, AMYLASE in the last 168 hours. No results for input(s): AMMONIA in the last 168 hours. CBC: Recent Labs  Lab 01/30/24 1631 02/01/24 0408 02/02/24 0346  WBC 7.9 10.7* 10.7*  HGB 12.2* 10.7* 10.6*  HCT 37.5* 32.1* 32.3*  MCV 90.8 89.4 90.2  PLT 155 127* 136*   Cardiac Enzymes: No results for input(s):  CKTOTAL, CKMB, CKMBINDEX, TROPONINI in the last 168 hours. BNP: Invalid input(s): POCBNP CBG: No results for input(s): GLUCAP in the last 168 hours. D-Dimer Recent Labs    01/30/24 1824  DDIMER 1.26*   Hgb A1c No results for input(s): HGBA1C in  the last 72 hours. Lipid Profile No results for input(s): CHOL, HDL, LDLCALC, TRIG, CHOLHDL, LDLDIRECT in the last 72 hours. Thyroid  function studies Recent Labs    01/31/24 1019  TSH 3.850   Anemia work up No results for input(s): VITAMINB12, FOLATE, FERRITIN, TIBC, IRON, RETICCTPCT in the last 72 hours. Urinalysis    Component Value Date/Time   COLORURINE YELLOW 02/20/2018 1214   APPEARANCEUR CLEAR 02/20/2018 1214   LABSPEC 1.015 02/20/2018 1214   PHURINE 6.0 02/20/2018 1214   GLUCOSEU NEGATIVE 02/20/2018 1214   HGBUR NEGATIVE 02/20/2018 1214   BILIRUBINUR N 04/01/2019 1724   KETONESUR NEGATIVE 02/20/2018 1214   PROTEINUR Negative 04/01/2019 1724   UROBILINOGEN 0.2 04/01/2019 1724   UROBILINOGEN 0.2 02/20/2018 1214   NITRITE N 04/01/2019 1724   NITRITE NEGATIVE 02/20/2018 1214   LEUKOCYTESUR Negative 04/01/2019 1724   Sepsis Labs Recent Labs  Lab 01/30/24 1631 02/01/24 0408 02/02/24 0346  WBC 7.9 10.7* 10.7*   Microbiology No results found for this or any previous visit (from the past 240 hours).  Time coordinating discharge:  41 mins  SIGNED:  Afton Louder, MD  [facility] 02/02/2024, 9:52 AM How to contact the [facility] Attending or Consulting provider 7A - 7P or covering provider during after hours 7P -7A, for this patient?  Check the care team in [facility] and look for a) attending/consulting [facility] provider listed and b) the [facility] team listed Log into [email or web address] and use [place]'s universal password to access. If you do not have the password, please contact the hospital operator. Locate the [facility] provider you are looking for under [facility] and page to a number  that you can be directly reached. If you still have difficulty reaching the provider, please page the [facility] (Director on Call) for the Hospitalists listed on amion for assistance.

## 2024-02-03 ENCOUNTER — Ambulatory Visit: Admitting: Orthopedic Surgery

## 2024-02-16 ENCOUNTER — Other Ambulatory Visit (HOSPITAL_COMMUNITY): Payer: Self-pay | Admitting: Orthopedic Surgery

## 2024-02-16 DIAGNOSIS — M25562 Pain in left knee: Secondary | ICD-10-CM

## 2024-02-16 DIAGNOSIS — M25462 Effusion, left knee: Secondary | ICD-10-CM

## 2024-02-19 ENCOUNTER — Ambulatory Visit (HOSPITAL_COMMUNITY)

## 2024-02-19 ENCOUNTER — Encounter (HOSPITAL_COMMUNITY): Payer: Self-pay

## 2024-02-26 ENCOUNTER — Encounter: Payer: Self-pay | Admitting: Student

## 2024-02-26 ENCOUNTER — Ambulatory Visit: Attending: Student | Admitting: Student

## 2024-02-26 VITALS — BP 152/82 | HR 56 | Wt 223.4 lb

## 2024-02-26 DIAGNOSIS — I480 Paroxysmal atrial fibrillation: Secondary | ICD-10-CM | POA: Diagnosis not present

## 2024-02-26 DIAGNOSIS — I2510 Atherosclerotic heart disease of native coronary artery without angina pectoris: Secondary | ICD-10-CM

## 2024-02-26 DIAGNOSIS — E785 Hyperlipidemia, unspecified: Secondary | ICD-10-CM | POA: Diagnosis not present

## 2024-02-26 DIAGNOSIS — I10 Essential (primary) hypertension: Secondary | ICD-10-CM | POA: Diagnosis not present

## 2024-02-26 DIAGNOSIS — I77819 Aortic ectasia, unspecified site: Secondary | ICD-10-CM

## 2024-02-26 MED ORDER — BENAZEPRIL HCL 40 MG PO TABS: 20.0000 mg | ORAL_TABLET | Freq: Every day | ORAL | Status: DC

## 2024-02-26 MED ORDER — APIXABAN 5 MG PO TABS: 5.0000 mg | ORAL_TABLET | Freq: Two times a day (BID) | ORAL | 3 refills | Status: DC

## 2024-02-26 MED ORDER — METOPROLOL SUCCINATE ER 25 MG PO TB24: 25.0000 mg | ORAL_TABLET | Freq: Every day | ORAL | 3 refills | Status: AC

## 2024-02-26 MED ORDER — APIXABAN 5 MG PO TABS: 5.0000 mg | ORAL_TABLET | Freq: Two times a day (BID) | ORAL | Status: AC

## 2024-02-26 NOTE — Patient Instructions (Signed)
 Medication Instructions:   Restart Benazepril  40 mg  Take 1/2 Tablet Daily   Monitor Blood Pressure and record readings  for 2-3 weeks   *If you need a refill on your cardiac medications before your next appointment, please call your pharmacy*  Lab Work: NONE   If you have labs (blood work) drawn today and your tests are completely normal, you will receive your results only by: MyChart Message (if you have MyChart) OR A paper copy in the mail If you have any lab test that is abnormal or we need to change your treatment, we will call you to review the results.  Testing/Procedures: NONE   Follow-Up: At [facility], you and your health needs are our priority.  As part of our continuing mission to provide you with exceptional heart care, our providers are all part of one team.  This team includes your primary Cardiologist (physician) and Advanced Practice Providers or APPs (Physician Assistants and Nurse Practitioners) who all work together to provide you with the care you need, when you need it.  Your next appointment:   3-4 month(s)  Provider:   Laymon Qua, PA-C    We recommend signing up for the patient portal called MyChart.  Sign up information is provided on this After Visit Summary.  MyChart is used to connect with patients for Virtual Visits (Telemedicine).  Patients are able to view lab/test results, encounter notes, upcoming appointments, etc.  Non-urgent messages can be sent to your provider as well.   To learn more about what you can do with MyChart, go to [email or web address].   Other Instructions Thank you for choosing [facility]!

## 2024-02-26 NOTE — Progress Notes (Signed)
 Cardiology Office Note    Date:  02/26/2024  ID:  LAQUINN SHIPPY, DOB March 31, 1941, MRN [id number] Cardiologist: Alvan Carrier, MD Cardiology APP:  Johnson Laymon HERO, PA-C { :  History of Present Illness:    Samuel WITHEM is a 83 y.o. male with past medical history of HTN, HLD, CAD (mild, nonobstructive disease by cardiac catheterization in 2023), prior CVA (felt to be embolic by [facility] in 02/2022 but following recent cardiac catheterization) and recently diagnosed atrial fibrillation who presents to the office today for hospital follow-up.  He was most recently admitted to [facility] on 01/30/2024 for evaluation of chest pain and palpitations and was found to be in atrial fibrillation with RVR. He was started on IV Cardizem and converted to normal sinus rhythm. Echocardiogram during admission showed a preserved EF of 60 to 65% with no regional wall motion abnormalities. 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 IV Cardizem was discontinued and he was switched to Toprol -XL 25 mg daily. Given his CHA2DS2-VASc score of at least 4, he was started on Eliquis 5 mg twice daily for anticoagulation with ASA being discontinued.  In talking with the patient today, he reports overall doing well since his recent hospitalization. 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 He has baseline dyspnea on exertion but no acute changes in this. No recent exertional chest pain or palpitations. Says he was very symptomatic when he had his episodes of atrial fibrillation. No recent orthopnea, PND or pitting edema. [facility] and [facility] Zio patch was placed at that time for unclear reasons as notes from that visit are not currently available. Remains on Eliquis for anticoagulation with no reports of active bleeding. His blood pressure is elevated during  today's visit and has also been elevated when checked at home. Benazepril  and HCTZ were discontinued at the time of hospital discharge given hypotension during his admission.  Studies Reviewed:   EKG: EKG is not ordered today.  Cardiac Catheterization: 02/2022 Findings:  --LM: mild luminal irregularties  --LAD: mild luminal irregularities; 20% ostial D1  --LCx: mild luminal irregularities  --RI: mild luminal irregularities  --RCA: mild luminal irregularities   --LVEDP: 17 mmHg    Comments: minimal non-obstructive CAD; minimally elevated LVEDP   Echocardiogram: 01/2024 IMPRESSIONS     1. Left ventricular ejection fraction, by estimation, is 60 to 65%. The  left ventricle has normal function. The left ventricle has no regional  wall motion abnormalities. There is mild concentric left ventricular  hypertrophy. Left ventricular diastolic  parameters are indeterminate.   2. Right ventricular systolic function is normal. The right ventricular  size is normal.   3. The mitral valve is degenerative. Trivial mitral valve regurgitation.  The mean mitral valve gradient is 3.0 mmHg. Moderate to severe mitral  annular calcification.   4. The aortic valve is tricuspid. There is mild calcification of the  aortic valve. Aortic valve regurgitation is mild. Aortic valve  sclerosis/calcification is present, without any evidence of aortic  stenosis. Aortic valve mean gradient measures 5.0  mmHg.   5. Aortic dilatation noted. There is mild dilatation of the aortic root,  measuring 40 mm. There is moderate dilatation of the ascending aorta,  measuring 42 mm.   6. Unable to estimate CVP.   Risk Assessment/Calculations:  CHA2DS2-VASc Score = 6  This indicates a 9.7% annual risk of stroke. The patient's score is based upon: CHF History: 0 HTN History: 1 Diabetes History: 0 Stroke History: 2 Vascular Disease History: 1 Age Score: 2 Gender Score: 0    HYPERTENSION CONTROL Vitals:    02/26/24 1452 02/26/24 1459 02/26/24 1514  BP: (!) 188/80 (!) 180/82 (!) 152/82    The patient's blood pressure is elevated above target today. In order to address the patient's elevated BP: A new medication was prescribed today.       Physical Exam:   VS:  BP (!) 152/82   Pulse (!) 56   Wt 223 lb 6.4 oz (101.3 kg)   SpO2 96%   BMI 30.30 kg/m    Wt Readings from Last 3 Encounters:  02/26/24 223 lb 6.4 oz (101.3 kg)  01/31/24 213 lb 6.5 oz (96.8 kg)  10/15/22 226 lb 8 oz (102.7 kg)     GEN: Well nourished, well developed male appearing in no acute distress NECK: No JVD; No carotid bruits CARDIAC: RRR, no murmurs, rubs, gallops RESPIRATORY:  Clear to auscultation without rales, wheezing or rhonchi  ABDOMEN: Appears non-distended. No obvious abdominal masses. EXTREMITIES: No clubbing or cyanosis. No pitting edema.  Distal pedal pulses are 2+ bilaterally.   Assessment and Plan:   1. PAF (paroxysmal atrial fibrillation) (HCC) - This was a new diagnosis for the patient during his admission in 01/2024. He did convert back to normal sinus rhythm with IV Cardizem as outlined above. He is in normal sinus rhythm at the time of examination today and does have a Zio patch in place which was ordered by the [facility]. For now, continue Toprol -XL 25 mg daily for rate-control. Would not further titrate given his heart rate in the 50's. - Continue Eliquis 5 mg twice daily for anticoagulation which is the appropriate dose given his current age (83 years old), weight (223 lbs) and renal function (creatinine at 1.43 by most recent labs on 02/02/2024). Will need to follow renal function closely because if creatinine trends above 1.5, will need to reduce dosing to 2.5 mg twice daily.  2. Coronary artery disease involving native coronary artery of native heart without angina pectoris - Prior cardiac catheterization in 02/2022 only showed mild luminal irregularities with 20% ostial D1 stenosis and medical  management was recommended. He denies any chest pain.  - No longer on ASA given the need for anticoagulation. Continue Atorvastatin  40mg  daily and Toprol -XL 25mg  daily.   3. Essential hypertension - BP is elevated during today's visit and has been elevated when checked at home. Will continue Toprol -XL 25 mg daily but have him restart Benazepril  at 20mg  daily. Was provided with a BP log and encouraged to return this in several weeks. If BP remains above goal, would titrate to his prior dose of 40mg  daily. Would need a repeat BMET (had labs at the [facility] earlier this week).   4. Hyperlipidemia LDL goal <70 - Followed by PCP. LDL was at 47 when checked in 09/2023. Continue Atorvastatin  40 mg daily.  5. 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 Would plan for repeat assessment in 1 year.   Signed, Laymon CHRISTELLA Qua, PA-C

## 2024-04-08 DIAGNOSIS — H5203 Hypermetropia, bilateral: Secondary | ICD-10-CM | POA: Diagnosis not present

## 2024-04-08 DIAGNOSIS — H524 Presbyopia: Secondary | ICD-10-CM | POA: Diagnosis not present

## 2024-04-08 DIAGNOSIS — H52223 Regular astigmatism, bilateral: Secondary | ICD-10-CM | POA: Diagnosis not present

## 2024-04-08 DIAGNOSIS — H25813 Combined forms of age-related cataract, bilateral: Secondary | ICD-10-CM | POA: Diagnosis not present

## 2024-04-30 ENCOUNTER — Encounter: Payer: Self-pay | Admitting: Student

## 2024-05-01 ENCOUNTER — Ambulatory Visit: Payer: Self-pay | Admitting: Student

## 2024-05-01 DIAGNOSIS — I10 Essential (primary) hypertension: Secondary | ICD-10-CM

## 2024-05-04 ENCOUNTER — Encounter: Payer: Self-pay | Admitting: Internal Medicine

## 2024-05-06 MED ORDER — BENAZEPRIL HCL 40 MG PO TABS: 40.0000 mg | ORAL_TABLET | Freq: Every day | ORAL | 3 refills | Status: AC

## 2024-05-06 NOTE — Telephone Encounter (Signed)
-----   Message from Brittany M Strader sent at 05/01/2024  1:19 PM EST ----- Please let the patient know that I reviewed his blood pressure log and given that BP has overall remained above goal, would increase Benazepril  to his prior dose of 40mg  daily. If he is in agreement,  will need a repeat BMET in 2 weeks for reassessment of electrolytes and renal function.

## 2024-05-06 NOTE — Telephone Encounter (Signed)
 Pt notified of med dose change of Benazepril  and the need for labs.

## 2024-05-21 LAB — BASIC METABOLIC PANEL WITH GFR
BUN/Creatinine Ratio: 9 — ABNORMAL LOW (ref 10–24)
BUN: 11 mg/dL (ref 8–27)
CO2: 21 mmol/L (ref 20–29)
Calcium: 9 mg/dL (ref 8.6–10.2)
Chloride: 108 mmol/L — ABNORMAL HIGH (ref 96–106)
Creatinine, Ser: 1.17 mg/dL (ref 0.76–1.27)
Glucose: 70 mg/dL (ref 70–99)
Potassium: 4.6 mmol/L (ref 3.5–5.2)
Sodium: 142 mmol/L (ref 134–144)
eGFR: 62 mL/min/{1.73_m2}

## 2024-06-11 ENCOUNTER — Ambulatory Visit: Admitting: Student
